# Patient Record
Sex: Male | Born: 1965 | Race: Black or African American | Hispanic: No | Marital: Single | Smoking: Former smoker
Health system: Southern US, Community
[De-identification: ages and names within clinical notes are randomized; demographics above are authoritative.]

## PROBLEM LIST (undated history)

## (undated) DIAGNOSIS — E7800 Pure hypercholesterolemia, unspecified: Secondary | ICD-10-CM

## (undated) DIAGNOSIS — K922 Gastrointestinal hemorrhage, unspecified: Secondary | ICD-10-CM

## (undated) DIAGNOSIS — C189 Malignant neoplasm of colon, unspecified: Secondary | ICD-10-CM

## (undated) DIAGNOSIS — D649 Anemia, unspecified: Secondary | ICD-10-CM

## (undated) DIAGNOSIS — R0600 Dyspnea, unspecified: Secondary | ICD-10-CM

## (undated) DIAGNOSIS — C801 Malignant (primary) neoplasm, unspecified: Secondary | ICD-10-CM

## (undated) DIAGNOSIS — I10 Essential (primary) hypertension: Secondary | ICD-10-CM

## (undated) HISTORY — PX: HEMICOLECTOMY: SHX854

## (undated) HISTORY — PX: COLON RESECTION: SHX5231

## (undated) HISTORY — PX: HERNIA REPAIR: SHX51

---

## 2017-11-27 ENCOUNTER — Encounter (HOSPITAL_COMMUNITY): Payer: Self-pay | Admitting: Cardiology

## 2017-11-27 ENCOUNTER — Emergency Department (HOSPITAL_COMMUNITY)
Admission: EM | Admit: 2017-11-27 | Discharge: 2017-11-27 | Disposition: A | Discharge status: Home / Self Care | Payer: BLUE CROSS/BLUE SHIELD | Attending: Emergency Medicine | Admitting: Emergency Medicine

## 2017-11-27 ENCOUNTER — Other Ambulatory Visit: Payer: Self-pay

## 2017-11-27 ENCOUNTER — Emergency Department (HOSPITAL_COMMUNITY): Payer: BLUE CROSS/BLUE SHIELD

## 2017-11-27 DIAGNOSIS — Z87891 Personal history of nicotine dependence: Secondary | ICD-10-CM | POA: Diagnosis not present

## 2017-11-27 DIAGNOSIS — R072 Precordial pain: Secondary | ICD-10-CM

## 2017-11-27 DIAGNOSIS — R079 Chest pain, unspecified: Secondary | ICD-10-CM | POA: Diagnosis present

## 2017-11-27 DIAGNOSIS — Z79899 Other long term (current) drug therapy: Secondary | ICD-10-CM | POA: Diagnosis not present

## 2017-11-27 DIAGNOSIS — R9431 Abnormal electrocardiogram [ECG] [EKG]: Secondary | ICD-10-CM | POA: Insufficient documentation

## 2017-11-27 HISTORY — DX: Pure hypercholesterolemia, unspecified: E78.00

## 2017-11-27 HISTORY — DX: Malignant (primary) neoplasm, unspecified: C80.1

## 2017-11-27 LAB — CBC WITH DIFFERENTIAL/PLATELET
BASOS ABS: 0 10*3/uL (ref 0.0–0.1)
BASOS PCT: 0 %
Eosinophils Absolute: 0.1 10*3/uL (ref 0.0–0.7)
Eosinophils Relative: 1 %
HEMATOCRIT: 43.8 % (ref 39.0–52.0)
Hemoglobin: 14.6 g/dL (ref 13.0–17.0)
Lymphocytes Relative: 29 %
Lymphs Abs: 2.4 10*3/uL (ref 0.7–4.0)
MCH: 30.8 pg (ref 26.0–34.0)
MCHC: 33.3 g/dL (ref 30.0–36.0)
MCV: 92.4 fL (ref 78.0–100.0)
MONO ABS: 0.5 10*3/uL (ref 0.1–1.0)
Monocytes Relative: 6 %
NEUTROS ABS: 5.1 10*3/uL (ref 1.7–7.7)
Neutrophils Relative %: 64 %
PLATELETS: 244 10*3/uL (ref 150–400)
RBC: 4.74 MIL/uL (ref 4.22–5.81)
RDW: 13.2 % (ref 11.5–15.5)
WBC: 8 10*3/uL (ref 4.0–10.5)

## 2017-11-27 LAB — COMPREHENSIVE METABOLIC PANEL
ALBUMIN: 3.9 g/dL (ref 3.5–5.0)
ALK PHOS: 68 U/L (ref 38–126)
ALT: 21 U/L (ref 17–63)
ANION GAP: 10 (ref 5–15)
AST: 20 U/L (ref 15–41)
BILIRUBIN TOTAL: 1.2 mg/dL (ref 0.3–1.2)
BUN: 14 mg/dL (ref 6–20)
CALCIUM: 9 mg/dL (ref 8.9–10.3)
CO2: 23 mmol/L (ref 22–32)
Chloride: 106 mmol/L (ref 101–111)
Creatinine, Ser: 1.28 mg/dL — ABNORMAL HIGH (ref 0.61–1.24)
GFR calc Af Amer: >60 mL/min (ref >60)
GLUCOSE: 100 mg/dL — AB (ref 65–99)
POTASSIUM: 3.8 mmol/L (ref 3.5–5.1)
Sodium: 139 mmol/L (ref 135–145)
TOTAL PROTEIN: 7.3 g/dL (ref 6.5–8.1)

## 2017-11-27 LAB — I-STAT TROPONIN, ED
Troponin i, poc: 0.02 ng/mL (ref 0.00–0.08)
Troponin i, poc: 0.01 ng/mL (ref 0.00–0.08)

## 2017-11-27 MED ORDER — IBUPROFEN 800 MG PO TABS: 800.0000 mg | ORAL_TABLET | Freq: Three times a day (TID) | ORAL | 0 refills | Status: DC | PRN

## 2017-11-27 NOTE — Progress Notes (Signed)
Discussed patient with Dr Laverta Baltimore ER staff. Presents with chronic chest pain since Thursday, overall atypical and positional. Enzymes negative. EKG with LVH and probable strain pattern. Plan for outpatient cardiac evaluation. We will work to arrange appt.    Carlyle Dolly MD

## 2017-11-27 NOTE — Discharge Instructions (Signed)

## 2017-11-27 NOTE — ED Provider Notes (Signed)
Emergency Department Provider Note   I have reviewed the triage vital signs and the nursing notes.   HISTORY  Chief Complaint Chest Pain   HPI [name] is a 52 y.o. male with PMH of HLD admitted for evaluation of chest pain for the past 4 days.  The patient went to his primary care physician's office today who performed an EKG which was abnormal and he was referred to the emergency department.  Patient describes his discomfort as in the center of his chest and worse with moving certain directions.  States is especially bad when he is laying on his stomach.  He denies any exertional or pleuritic pain.  He has not been without any chest pain since onset.  He has not taken any over-the-counter medications for pain.  No history of heart attack.  He reports a remote history of stress test and does not follow with a cardiologist regularly.    Past Medical History:  Diagnosis Date  . Cancer [facility])    colon cancer  . High cholesterol     There are no active problems to display for this patient.   Past Surgical History:  Procedure Laterality Date  . COLON RESECTION    . HERNIA REPAIR      Current Outpatient Rx  . Order #: [id number] Class: Historical Med  . Order #: [id number] Class: Historical Med  . Order #: [id number] Class: Print    Allergies Patient has no known allergies.  Family History  Problem Relation Age of Onset  . Hypertension Mother   . Hypertension Sister   . Hypertension Brother   . Heart attack Cousin     Social History Social History   Tobacco Use  . Smoking status: Former Research scientist (life sciences)  . Smokeless tobacco: Never Used  Substance Use Topics  . Alcohol use: No    Frequency: Never  . Drug use: No    Review of Systems  Constitutional: No fever/chills Eyes: No visual changes. ENT: No sore throat. Cardiovascular: Positive chest pain. Respiratory: Denies shortness of breath. Gastrointestinal: No abdominal pain.  No nausea, no vomiting.  No diarrhea.   No constipation. Genitourinary: Negative for dysuria. Musculoskeletal: Negative for back pain. Skin: Negative for rash. Neurological: Negative for headaches, focal weakness or numbness.  10-point ROS otherwise negative.  ____________________________________________   PHYSICAL EXAM:  VITAL SIGNS: ED Triage Vitals  Enc Vitals Group     BP 11/27/17 1119 (!) 108/102     Pulse Rate 11/27/17 1119 (!) 59     Resp 11/27/17 1119 17     Temp 11/27/17 1119 (!) 97.5 F (36.4 C)     Temp Source 11/27/17 1119 Oral     SpO2 11/27/17 1119 100 %     Weight 11/27/17 1121 220 lb (99.8 kg)     Height 11/27/17 1121 6' (1.829 m)     Pain Score 11/27/17 1120 4   Constitutional: Alert and oriented. Well appearing and in no acute distress. Eyes: Conjunctivae are normal. Head: Atraumatic. Nose: No congestion/rhinnorhea. Mouth/Throat: Mucous membranes are moist.  Neck: No stridor. Cardiovascular: Normal rate, regular rhythm. Good peripheral circulation. Grossly normal heart sounds.   Respiratory: Normal respiratory effort.  No retractions. Lungs CTAB. Gastrointestinal: Soft and nontender. No distention.  Musculoskeletal: No lower extremity tenderness nor edema. No gross deformities of extremities. Neurologic:  Normal speech and language. No gross focal neurologic deficits are appreciated.  Skin:  Skin is warm, dry and intact. No rash noted.  ____________________________________________   LABS (all labs  ordered are listed, but only abnormal results are displayed)  Labs Reviewed  COMPREHENSIVE METABOLIC PANEL - Abnormal; Notable for the following components:      Result Value   Glucose, Bld 100 (*)    Creatinine, Ser 1.28 (*)    All other components within normal limits  CBC WITH DIFFERENTIAL/PLATELET  I-STAT TROPONIN, ED  I-STAT TROPONIN, ED   ____________________________________________  EKG   EKG Interpretation  Date/Time:  Monday November 27 2017 11:56:16 EST Ventricular Rate:    61 PR Interval:    QRS Duration: 101 QT Interval:  412 QTC Calculation: 415 R Axis:   56 Text Interpretation:  Sinus rhythm Atrial premature complex Consider left atrial enlargement LVH with secondary repolarization abnormality Baseline wander in lead(s) V6 No STEMI.  Confirmed by Nanda Quinton [phone]) on 11/27/2017 12:25:53 PM       ____________________________________________  RADIOLOGY  Dg Chest 2 View  Result Date: 11/27/2017 CLINICAL DATA:  Left-sided chest pain.  Former smoker. EXAM: CHEST  2 VIEW COMPARISON:  None. FINDINGS: The heart size and mediastinal contours are within normal limits. Both lungs are clear. Small right pleural effusion noted. The visualized skeletal structures are unremarkable. IMPRESSION: 1. Small right pleural effusion.  Exam otherwise unremarkable. Electronically Signed   By: Kerby Moors M.D.   On: 11/27/2017 12:25    ____________________________________________   PROCEDURES  Procedure(s) performed:   Procedures  None ____________________________________________   INITIAL IMPRESSION / ASSESSMENT AND PLAN / ED COURSE  Pertinent labs & imaging results that were available during my care of the patient were reviewed by me and considered in my medical decision making (see chart for details).  Patient presents to the emergency department with 4 days of constant chest discomfort made worse with movement.  EKG is concerning but I do not see evidence of STEMI.  Plan for labs, chest x-ray, discussion with cardiology as needed.  Differential includes all life-threatening causes for chest pain. This includes but is not exclusive to acute coronary syndrome, aortic dissection, pulmonary embolism, cardiac tamponade, community-acquired pneumonia, pericarditis, musculoskeletal chest wall pain, etc.  01:30 PM Spoke with Cardiology Dr. Harl Bowie. He has reviewed the EKG and believes that the changes are most consistent with LVH. With atypical pain character he  will arrange outpatient evaluation. I do plan to trend biomarkers here. Discussed with patient who is in agreement with plan.   Repeat troponin negative. Plan for outpatient Cardiology follow up and PCP evaluation. Discharged home with Motrin and discussed return precautions in detail.   At this time, I do not feel there is any life-threatening condition present. I have reviewed and discussed all results (EKG, imaging, lab, urine as appropriate), exam findings with patient. I have reviewed nursing notes and appropriate previous records.  I feel the patient is safe to be discharged home without further emergent workup. Discussed usual and customary return precautions. Patient and family (if present) verbalize understanding and are comfortable with this plan.  Patient will follow-up with their primary care provider. If they do not have a primary care provider, information for follow-up has been provided to them. All questions have been answered.  ____________________________________________  FINAL CLINICAL IMPRESSION(S) / ED DIAGNOSES  Final diagnoses:  Precordial chest pain    NEW OUTPATIENT MEDICATIONS STARTED DURING THIS VISIT:  Discharge Medication List as of 11/27/2017  3:00 PM    START taking these medications   Details  ibuprofen (ADVIL,MOTRIN) 800 MG tablet Take 1 tablet (800 mg total) by mouth every 8 (eight)  hours as needed., Starting Mon 11/27/2017, Print        Note:  This document was prepared using Dragon voice recognition software and may include unintentional dictation errors.  Nanda Quinton, MD Emergency Medicine    Long, Wonda Olds, MD 11/27/17 [phone]

## 2017-11-27 NOTE — ED Notes (Signed)
Patient transported to X-ray 

## 2017-11-27 NOTE — ED Triage Notes (Signed)
Chest pain since Thursday.  Went to PCP today and sent here to ER.  Pt had abnormal EKG in office.

## 2017-12-21 ENCOUNTER — Encounter: Payer: Self-pay | Admitting: Cardiology

## 2017-12-21 ENCOUNTER — Ambulatory Visit (INDEPENDENT_AMBULATORY_CARE_PROVIDER_SITE_OTHER): Payer: BLUE CROSS/BLUE SHIELD | Admitting: Cardiology

## 2017-12-21 VITALS — BP 161/98 | HR 60 | Ht 72.0 in | Wt 216.0 lb

## 2017-12-21 DIAGNOSIS — R030 Elevated blood-pressure reading, without diagnosis of hypertension: Secondary | ICD-10-CM

## 2017-12-21 DIAGNOSIS — R9431 Abnormal electrocardiogram [ECG] [EKG]: Secondary | ICD-10-CM

## 2017-12-21 DIAGNOSIS — R079 Chest pain, unspecified: Secondary | ICD-10-CM | POA: Diagnosis not present

## 2017-12-21 NOTE — Patient Instructions (Signed)
Your physician recommends that you schedule a follow-up appointment in: 3 MONTHS with Dr Harl Bowie    Your physician recommends that you continue on your current medications as directed. Please refer to the Current Medication list given to you today.    Your physician has requested that you have an echocardiogram. Echocardiography is a painless test that uses sound waves to create images of your heart. It provides your doctor with information about the size and shape of your heart and how well your heart's chambers and valves are working. This procedure takes approximately one hour. There are no restrictions for this procedure.  Your physician has requested that you have en exercise stress myoview. For further information please visit [email or web address]. Please follow instruction sheet, as given.   DASH Eating Plan DASH stands for "Dietary Approaches to Stop Hypertension." The DASH eating plan is a healthy eating plan that has been shown to reduce high blood pressure (hypertension). It may also reduce your risk for type 2 diabetes, heart disease, and stroke. The DASH eating plan may also help with weight loss. What are tips for following this plan? General guidelines  Avoid eating more than 2,300 mg (milligrams) of salt (sodium) a day. If you have hypertension, you may need to reduce your sodium intake to 1,500 mg a day.  Limit alcohol intake to no more than 1 drink a day for nonpregnant women and 2 drinks a day for men. One drink equals 12 oz of beer, 5 oz of wine, or 1 oz of hard liquor.  Work with your health care provider to maintain a healthy body weight or to lose weight. Ask what an ideal weight is for you.  Get at least 30 minutes of exercise that causes your heart to beat faster (aerobic exercise) most days of the week. Activities may include walking, swimming, or biking.  Work with your health care provider or diet and nutrition specialist (dietitian) to adjust your eating plan to  your individual calorie needs. Reading food labels  Check food labels for the amount of sodium per serving. Choose foods with less than 5 percent of the Daily Value of sodium. Generally, foods with less than 300 mg of sodium per serving fit into this eating plan.  To find whole grains, look for the word "whole" as the first word in the ingredient list. Shopping  Buy products labeled as "low-sodium" or "no salt added."  Buy fresh foods. Avoid canned foods and premade or frozen meals. Cooking  Avoid adding salt when cooking. Use salt-free seasonings or herbs instead of table salt or sea salt. Check with your health care provider or pharmacist before using salt substitutes.  Do not fry foods. Cook foods using healthy methods such as baking, boiling, grilling, and broiling instead.  Cook with heart-healthy oils, such as olive, canola, soybean, or sunflower oil. Meal planning   Eat a balanced diet that includes: ? 5 or more servings of fruits and vegetables each day. At each meal, try to fill half of your plate with fruits and vegetables. ? Up to 6-8 servings of whole grains each day. ? Less than 6 oz of lean meat, poultry, or fish each day. A 3-oz serving of meat is about the same size as a deck of cards. One egg equals 1 oz. ? 2 servings of low-fat dairy each day. ? A serving of nuts, seeds, or beans 5 times each week. ? Heart-healthy fats. Healthy fats called Omega-3 fatty acids are found in foods such as  flaxseeds and coldwater fish, like sardines, salmon, and mackerel.  Limit how much you eat of the following: ? Canned or prepackaged foods. ? Food that is high in trans fat, such as fried foods. ? Food that is high in saturated fat, such as fatty meat. ? Sweets, desserts, sugary drinks, and other foods with added sugar. ? Full-fat dairy products.  Do not salt foods before eating.  Try to eat at least 2 vegetarian meals each week.  Eat more home-cooked food and less restaurant,  buffet, and fast food.  When eating at a restaurant, ask that your food be prepared with less salt or no salt, if possible. What foods are recommended? The items listed may not be a complete list. Talk with your dietitian about what dietary choices are best for you. Grains Whole-grain or whole-wheat bread. Whole-grain or whole-wheat pasta. Brown rice. [place] Morrow. Bulgur. Whole-grain and low-sodium cereals. Pita bread. Low-fat, low-sodium crackers. Whole-wheat flour tortillas. Vegetables Fresh or frozen vegetables (raw, steamed, roasted, or grilled). Low-sodium or reduced-sodium tomato and vegetable juice. Low-sodium or reduced-sodium tomato sauce and tomato paste. Low-sodium or reduced-sodium canned vegetables. Fruits All fresh, dried, or frozen fruit. Canned fruit in natural juice (without added sugar). Meat and other protein foods Skinless chicken or [place]. Ground chicken or [place]. Pork with fat trimmed off. Fish and seafood. Egg whites. Dried beans, peas, or lentils. Unsalted nuts, nut butters, and seeds. Unsalted canned beans. Lean cuts of beef with fat trimmed off. Low-sodium, lean deli meat. Dairy Low-fat (1%) or fat-free (skim) milk. Fat-free, low-fat, or reduced-fat cheeses. Nonfat, low-sodium ricotta or cottage cheese. Low-fat or nonfat yogurt. Low-fat, low-sodium cheese. Fats and oils Soft margarine without trans fats. Vegetable oil. Low-fat, reduced-fat, or light mayonnaise and salad dressings (reduced-sodium). Canola, safflower, olive, soybean, and sunflower oils. Avocado. Seasoning and other foods Herbs. Spices. Seasoning mixes without salt. Unsalted popcorn and pretzels. Fat-free sweets. What foods are not recommended? The items listed may not be a complete list. Talk with your dietitian about what dietary choices are best for you. Grains Baked goods made with fat, such as croissants, muffins, or some breads. Dry pasta or rice meal packs. Vegetables Creamed or fried  vegetables. Vegetables in a cheese sauce. Regular canned vegetables (not low-sodium or reduced-sodium). Regular canned tomato sauce and paste (not low-sodium or reduced-sodium). Regular tomato and vegetable juice (not low-sodium or reduced-sodium). Angie Fava. Olives. Fruits Canned fruit in a light or heavy syrup. Fried fruit. Fruit in cream or butter sauce. Meat and other protein foods Fatty cuts of meat. Ribs. Fried meat. Berniece Salines. Sausage. Bologna and other processed lunch meats. Salami. Fatback. Hotdogs. Bratwurst. Salted nuts and seeds. Canned beans with added salt. Canned or smoked fish. Whole eggs or egg yolks. Chicken or [place] with skin. Dairy Whole or 2% milk, cream, and half-and-half. Whole or full-fat cream cheese. Whole-fat or sweetened yogurt. Full-fat cheese. Nondairy creamers. Whipped toppings. Processed cheese and cheese spreads. Fats and oils Butter. Stick margarine. Lard. Shortening. Ghee. Bacon fat. Tropical oils, such as coconut, palm kernel, or palm oil. Seasoning and other foods Salted popcorn and pretzels. Onion salt, garlic salt, seasoned salt, table salt, and sea salt. Worcestershire sauce. Tartar sauce. Barbecue sauce. Teriyaki sauce. Soy sauce, including reduced-sodium. Steak sauce. Canned and packaged gravies. Fish sauce. Oyster sauce. Cocktail sauce. Horseradish that you find on the shelf. Ketchup. Mustard. Meat flavorings and tenderizers. Bouillon cubes. Hot sauce and Tabasco sauce. Premade or packaged marinades. Premade or packaged taco seasonings. Relishes. Regular salad dressings. Where  to find more information:  National Heart, Lung, and [place]: https://wilson-eaton.com/  American Heart Association: www.heart.org Summary  The DASH eating plan is a healthy eating plan that has been shown to reduce high blood pressure (hypertension). It may also reduce your risk for type 2 diabetes, heart disease, and stroke.  With the DASH eating plan, you should limit salt (sodium)  intake to 2,300 mg a day. If you have hypertension, you may need to reduce your sodium intake to 1,500 mg a day.  When on the DASH eating plan, aim to eat more fresh fruits and vegetables, whole grains, lean proteins, low-fat dairy, and heart-healthy fats.  Work with your health care provider or diet and nutrition specialist (dietitian) to adjust your eating plan to your individual calorie needs. This information is not intended to replace advice given to you by your health care provider. Make sure you discuss any questions you have with your health care provider. Document Released: 10/06/2011 Document Revised: 10/10/2016 Document Reviewed: 10/10/2016 Elsevier Interactive Patient Education  [employer].

## 2017-12-21 NOTE — Progress Notes (Signed)
Clinical Summary [name] is a 52 y.o.male seen as new consult, referred by Dr Woody Seller for chest pain.   1. Chest pain - seen in ER Nov 27 2017 with atypical chest pain.  - EKG with LVH, probable strain pattern. Trop neg x 2. CXR small right effusion - left sided, pressure like pain. Radiated to shoulder blade, needles like feeling. No other associated symptoms. Worst with position. Constant for several days in a row.  - better with ibuprofen. Symptoms have since resolved  CAD risk factors: HL, former smoker   2. Hyperlipidemia - was on lipitor several years ago he reports  3. Elevated BP - taking ibuprofen at home for ongoing back pain - prior bps at goal with other visits  Past Medical History:  Diagnosis Date  . Cancer [facility])    colon cancer  . High cholesterol      No Known Allergies   Current Outpatient Medications  Medication Sig Dispense Refill  . cyclobenzaprine (FLEXERIL) 10 MG tablet TAKE 1 TABLET AS NEEDED AT BEDTIME FOR MUSCLE SPASM  1  . ibuprofen (ADVIL,MOTRIN) 800 MG tablet Take 1 tablet (800 mg total) by mouth every 8 (eight) hours as needed. 21 tablet 0  . traMADol (ULTRAM) 50 MG tablet Take 50 mg by mouth every 12 (twelve) hours as needed for moderate pain.     No current facility-administered medications for this visit.      Past Surgical History:  Procedure Laterality Date  . COLON RESECTION    . HERNIA REPAIR       No Known Allergies    Family History  Problem Relation Age of Onset  . Hypertension Mother   . Hypertension Sister   . Hypertension Brother   . Heart attack Cousin      Social History [name] reports that he has quit smoking. he has never used smokeless tobacco. [name] reports that he does not drink alcohol.   Review of Systems CONSTITUTIONAL: No weight loss, fever, chills, weakness or fatigue.  HEENT: Eyes: No visual loss, blurred vision, double vision or yellow sclerae.No hearing loss, sneezing,  congestion, runny nose or sore throat.  SKIN: No rash or itching.  CARDIOVASCULAR: per hpi RESPIRATORY: per hpi GASTROINTESTINAL: No anorexia, nausea, vomiting or diarrhea. No abdominal pain or blood.  GENITOURINARY: No burning on urination, no polyuria NEUROLOGICAL: No headache, dizziness, syncope, paralysis, ataxia, numbness or tingling in the extremities. No change in bowel or bladder control.  MUSCULOSKELETAL: No muscle, back pain, joint pain or stiffness.  LYMPHATICS: No enlarged nodes. No history of splenectomy.  PSYCHIATRIC: No history of depression or anxiety.  ENDOCRINOLOGIC: No reports of sweating, cold or heat intolerance. No polyuria or polydipsia.  Marland Kitchen   Physical Examination Vitals:   12/21/17 0915 12/21/17 0920  BP: (!) 157/102 (!) 161/98  Pulse: 60   SpO2: 98%    Vitals:   12/21/17 0915  Weight: 216 lb (98 kg)  Height: 6' (1.829 m)    Gen: resting comfortably, no acute distress HEENT: no scleral icterus, pupils equal round and reactive, no palptable cervical adenopathy,  CV: RRR, no m/r/,g no jvd Resp: Clear to auscultation bilaterally GI: abdomen is soft, non-tender, non-distended, normal bowel sounds, no hepatosplenomegaly MSK: extremities are warm, no edema.  Skin: warm, no rash Neuro:  no focal deficits Psych: appropriate affect   Diagnostic Studies     Assessment and Plan  1. Chest pain - atypical chest pain. Abnormal EKG, likely LVH with  strain but cannot excluded lateral ischemia - given symptoms and abnromal EKG along with CAD risk factors, further testing is warranted. - will start with echo, pending results likely obtain exercise nuclear stress test.  2. Elevated bp - bp elevated in clinic, from chart review bp's have been normal at other clinic visits - follow bp trend, given info on DASH diet. If significant LVH on echo consider starting medical therapy.   F/u 3 months     Arnoldo Lenis, M.D

## 2017-12-26 ENCOUNTER — Encounter: Payer: Self-pay | Admitting: Cardiology

## 2017-12-29 ENCOUNTER — Ambulatory Visit (HOSPITAL_COMMUNITY): Payer: BLUE CROSS/BLUE SHIELD

## 2017-12-29 ENCOUNTER — Other Ambulatory Visit (HOSPITAL_COMMUNITY): Payer: BLUE CROSS/BLUE SHIELD

## 2018-04-12 ENCOUNTER — Ambulatory Visit: Payer: BLUE CROSS/BLUE SHIELD | Admitting: Cardiology

## 2018-04-12 NOTE — Progress Notes (Deleted)
Clinical Summary Mr. Matheny is a 52 y.o.male    1. Chest pain - seen in ER Nov 27 2017 with atypical chest pain.  - EKG with LVH, probable strain pattern. Trop neg x 2. CXR small right effusion - left sided, pressure like pain. Radiated to shoulder blade, needles like feeling. No other associated symptoms. Worst with position. Constant for several days in a row.  - better with ibuprofen. Symptoms have since resolved  CAD risk factors: HL, former smoker   2. Hyperlipidemia - was on lipitor several years ago he reports  3. Elevated BP - taking ibuprofen at home for ongoing back pain - prior bps at goal with other visits     Past Medical History:  Diagnosis Date  . Cancer [facility])    colon cancer  . High cholesterol      No Known Allergies   Current Outpatient Medications  Medication Sig Dispense Refill  . cyclobenzaprine (FLEXERIL) 10 MG tablet TAKE 1 TABLET AS NEEDED AT BEDTIME FOR MUSCLE SPASM  1  . ibuprofen (ADVIL,MOTRIN) 800 MG tablet Take 1 tablet (800 mg total) by mouth every 8 (eight) hours as needed. 21 tablet 0  . traMADol (ULTRAM) 50 MG tablet Take 50 mg by mouth every 12 (twelve) hours as needed for moderate pain.     No current facility-administered medications for this visit.      Past Surgical History:  Procedure Laterality Date  . COLON RESECTION    . HERNIA REPAIR       No Known Allergies    Family History  Problem Relation Age of Onset  . Hypertension Mother   . Hypertension Sister   . Hypertension Brother   . Heart attack Cousin   . Cancer Father      Social History Mr. Hutzler reports that he has quit smoking. He has never used smokeless tobacco. Mr. Helle reports that he does not drink alcohol.   Review of Systems CONSTITUTIONAL: No weight loss, fever, chills, weakness or fatigue.  HEENT: Eyes: No visual loss, blurred vision, double vision or yellow sclerae.No hearing loss, sneezing, congestion, runny nose or  sore throat.  SKIN: No rash or itching.  CARDIOVASCULAR:  RESPIRATORY: No shortness of breath, cough or sputum.  GASTROINTESTINAL: No anorexia, nausea, vomiting or diarrhea. No abdominal pain or blood.  GENITOURINARY: No burning on urination, no polyuria NEUROLOGICAL: No headache, dizziness, syncope, paralysis, ataxia, numbness or tingling in the extremities. No change in bowel or bladder control.  MUSCULOSKELETAL: No muscle, back pain, joint pain or stiffness.  LYMPHATICS: No enlarged nodes. No history of splenectomy.  PSYCHIATRIC: No history of depression or anxiety.  ENDOCRINOLOGIC: No reports of sweating, cold or heat intolerance. No polyuria or polydipsia.  Marland Kitchen   Physical Examination There were no vitals filed for this visit. There were no vitals filed for this visit.  Gen: resting comfortably, no acute distress HEENT: no scleral icterus, pupils equal round and reactive, no palptable cervical adenopathy,  CV Resp: Clear to auscultation bilaterally GI: abdomen is soft, non-tender, non-distended, normal bowel sounds, no hepatosplenomegaly MSK: extremities are warm, no edema.  Skin: warm, no rash Neuro:  no focal deficits Psych: appropriate affect   Diagnostic Studies     Assessment and Plan   1. Chest pain - atypical chest pain. Abnormal EKG, likely LVH with strain but cannot excluded lateral ischemia - given symptoms and abnromal EKG along with CAD risk factors, further testing is warranted. - will start with echo,  pending results likely obtain exercise nuclear stress test.  2. Elevated bp - bp elevated in clinic, from chart review bp's have been normal at other clinic visits - follow bp trend, given info on DASH diet. If significant LVH on echo consider starting medical therapy.   F/u 3 months       Arnoldo Lenis, M.D., F.A.C.C.

## 2019-09-24 ENCOUNTER — Emergency Department (HOSPITAL_COMMUNITY)
Admission: EM | Admit: 2019-09-24 | Discharge: 2019-09-24 | Disposition: A | Discharge status: Home / Self Care | Payer: BC Managed Care – PPO | Attending: Emergency Medicine | Admitting: Emergency Medicine

## 2019-09-24 ENCOUNTER — Other Ambulatory Visit: Payer: Self-pay

## 2019-09-24 ENCOUNTER — Encounter: Payer: Self-pay | Admitting: Physical Medicine and Rehabilitation

## 2019-09-24 ENCOUNTER — Emergency Department (HOSPITAL_COMMUNITY): Payer: BC Managed Care – PPO

## 2019-09-24 ENCOUNTER — Encounter (HOSPITAL_COMMUNITY): Payer: Self-pay

## 2019-09-24 ENCOUNTER — Encounter
Payer: Worker's Compensation | Attending: Physical Medicine and Rehabilitation | Admitting: Physical Medicine and Rehabilitation

## 2019-09-24 VITALS — BP 147/89 | HR 73 | Temp 98.4°F | Ht 72.0 in | Wt 224.0 lb

## 2019-09-24 DIAGNOSIS — R531 Weakness: Secondary | ICD-10-CM | POA: Diagnosis present

## 2019-09-24 DIAGNOSIS — Z85038 Personal history of other malignant neoplasm of large intestine: Secondary | ICD-10-CM | POA: Diagnosis not present

## 2019-09-24 DIAGNOSIS — Z87891 Personal history of nicotine dependence: Secondary | ICD-10-CM | POA: Diagnosis not present

## 2019-09-24 DIAGNOSIS — M47812 Spondylosis without myelopathy or radiculopathy, cervical region: Secondary | ICD-10-CM

## 2019-09-24 DIAGNOSIS — M5030 Other cervical disc degeneration, unspecified cervical region: Secondary | ICD-10-CM | POA: Diagnosis not present

## 2019-09-24 DIAGNOSIS — M62838 Other muscle spasm: Secondary | ICD-10-CM | POA: Insufficient documentation

## 2019-09-24 DIAGNOSIS — R202 Paresthesia of skin: Secondary | ICD-10-CM | POA: Insufficient documentation

## 2019-09-24 LAB — CBC
HCT: 39.7 % (ref 39.0–52.0)
Hemoglobin: 13.1 g/dL (ref 13.0–17.0)
MCH: 30 pg (ref 26.0–34.0)
MCHC: 33 g/dL (ref 30.0–36.0)
MCV: 91.1 fL (ref 80.0–100.0)
Platelets: 270 10*3/uL (ref 150–400)
RBC: 4.36 MIL/uL (ref 4.22–5.81)
RDW: 13 % (ref 11.5–15.5)
WBC: 7.7 10*3/uL (ref 4.0–10.5)
nRBC: 0 % (ref 0.0–0.2)

## 2019-09-24 LAB — BASIC METABOLIC PANEL
Anion gap: 8 (ref 5–15)
BUN: 10 mg/dL (ref 6–20)
CO2: 27 mmol/L (ref 22–32)
Calcium: 9.2 mg/dL (ref 8.9–10.3)
Chloride: 106 mmol/L (ref 98–111)
Creatinine, Ser: 1.37 mg/dL — ABNORMAL HIGH (ref 0.61–1.24)
GFR calc Af Amer: >60 mL/min (ref >60)
GFR calc non Af Amer: 58 mL/min — ABNORMAL LOW (ref >60)
Glucose, Bld: 95 mg/dL (ref 70–99)
Potassium: 4.3 mmol/L (ref 3.5–5.1)
Sodium: 141 mmol/L (ref 135–145)

## 2019-09-24 MED ORDER — GADOBUTROL 1 MMOL/ML IV SOLN
10.0000 mL | Freq: Once | INTRAVENOUS | Status: AC | PRN
Start: 2019-09-24 — End: 2019-09-24
  Administered 2019-09-24: 18:00:00 10 mL via INTRAVENOUS

## 2019-09-24 NOTE — ED Notes (Signed)
Pt in MRI.

## 2019-09-24 NOTE — Discharge Instructions (Signed)
You were seen in the emergency department today with intermittent weakness in your leg along with tingling and spasms.  Your MRI showed some minor degenerative disc disease but nothing to explain your symptoms.  Please call the neurologist listed who will continue to be work-up as an outpatient.  I have placed a referral but you will need to call to confirm the appointment.  Return to the emergency department any new or suddenly worsening symptoms

## 2019-09-24 NOTE — ED Triage Notes (Signed)
Pt sent here by PCP for an MRI to rule out cervical myelopathy due to left leg weakness and right wrist weakness since a work injury on 11/4 when he hit his finger on an open light and felt pain shooting through his body.

## 2019-09-24 NOTE — Progress Notes (Addendum)
Subjective:    Patient ID: [name], male    DOB: 19-Aug-1966, 53 y.o.   MRN: [id number]  HPI  [name] had a work injury on 11/4 where he hit his finger on an open light and felt a pain shooting through his body including his neck. Since then he has felt a shooting pain from his neck down his arms and in his legs with progressive weakness. He denies urinary incontinence or bowel incontinence.   Pain Inventory Average Pain 3 Pain Right Now 1 My pain is intermittent, dull, tingling, aching and headaches sometimes  In the last 24 hours, has pain interfered with the following? General activity 3 Relation with others 1 Enjoyment of life 2 What TIME of day is your pain at its worst? morning and night Sleep (in general) Fair  Pain is worse with: walking and standing Pain improves with: rest Relief from Meds: 2  Mobility walk without assistance ability to climb steps?  yes do you drive?  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  no  Relates that about 12 hours after shock he was laying in bed and right leg started jumping and then left leg went numb primarily in anterior thigh and down leg  Physicians involved in your care Any changes since last visit?  no Primary care Dhruv Vyas MDV   Family History  Problem Relation Age of Onset  . Hypertension Mother   . Hypertension Sister   . Hypertension Brother   . Heart attack Cousin   . Cancer Father    Social History   Socioeconomic History  . Marital status: Single    Spouse name: Not on file  . Number of children: Not on file  . Years of education: Not on file  . Highest education level: Not on file  Occupational History  . Not on file  Social Needs  . Financial resource strain: Not on file  . Food insecurity    Worry: Not on file    Inability: Not on file   . Transportation needs    Medical: Not on file    Non-medical: Not on file  Tobacco Use  . Smoking status: Former Research scientist (life sciences)  . Smokeless tobacco: Never Used  Substance and Sexual Activity  . Alcohol use: No    Frequency: Never  . Drug use: No  . Sexual activity: Not on file  Lifestyle  . Physical activity    Days per week: Not on file    Minutes per session: Not on file  . Stress: Not on file  Relationships  . Social Herbalist on phone: Not on file    Gets together: Not on file    Attends religious service: Not on file    Active member of club or organization: Not on file    Attends meetings of clubs or organizations: Not on file    Relationship status: Not on file  Other Topics Concern  . Not on file  Social History Narrative  . Not on file   Past Surgical History:  Procedure Laterality Date  . COLON RESECTION    . HERNIA REPAIR     Past Medical History:  Diagnosis Date  . Cancer [facility])    colon cancer  . High cholesterol    BP (!) 147/89   Pulse 73   Temp 98.4  F (36.9 C)   Ht 6' (1.829 m)   Wt 224 lb (101.6 kg)   SpO2 97%   BMI 30.38 kg/m   Opioid Risk Score:   Fall Risk Score:  `1  Depression screen PHQ 2/9  Depression screen PHQ 2/9 09/24/2019  Decreased Interest 2  Down, Depressed, Hopeless 2  PHQ - 2 Score 4  Altered sleeping 3  Tired, decreased energy 2  Change in appetite 1  Feeling bad or failure about yourself  0  Trouble concentrating 1  Moving slowly or fidgety/restless 2  Suicidal thoughts 0  PHQ-9 Score 13  Difficult doing work/chores Somewhat difficult    Review of Systems  Constitutional: Negative.   HENT: Negative.   Respiratory: Negative.   Cardiovascular: Negative.   Gastrointestinal: Negative.   Endocrine: Negative.   Genitourinary: Negative.   Musculoskeletal:       Spasms  Skin: Negative.   Allergic/Immunologic: Negative.   Neurological: Positive for dizziness and numbness.       Tingling   Hematological: Negative.   Psychiatric/Behavioral: Positive for dysphoric mood.  All other systems reviewed and are negative.      Objective:   Physical Exam  Gen: no distress, normal appearing HEENT: oral mucosa pink and moist, NCAT Cardio: Reg rate Chest: normal effort, normal rate of breathing Abd: soft, non-distended Ext: no edema Skin: intact Neuro: AOx3.  Musculoskeletal: 4/5 weakness in his left leg and 4/5 weakness in right wrist flexion.  Psych: pleasant, normal affect    Assessment & Plan:  [name] had a work injury on 11/4 where he hit his finger on an open light and felt a pain shooting through his body including his neck. Since then he has felt a shooting pain from his neck down his arms and in his legs with progressive weakness. He denies urinary incontinence or bowel incontinence.   I recommend urgent MRI to r/o cervical myelopathy given his severe weakness in his left leg and right wrist. I advised that patient should go to the ED. He should not work and lift heavy loads until he has his MRI. We will determine patient treatment plan based on this.   Case discussed with patient and Worker's Curator. I will also call ED to discuss.   I will also provide letter regarding return to work. He should not work until cleared after the MRI.   Leeroy Cha, MD

## 2019-09-24 NOTE — ED Provider Notes (Signed)
Blood pressure 138/83, pulse 62, temperature 98.5 F (36.9 C), temperature source Oral, resp. rate 16, height 6' (1.829 m), weight 101.2 kg, SpO2 98 %.  Assuming care from Dr. Rex Kras.  In short, [name] is a 53 y.o. male with a chief complaint of Weakness .  Refer to the original H&P for additional details.  The current plan of care is to f/u on MRI cervical spine and reassess.  07:07 PM  MRI motion degraded but no acute findings. Some disc degeneration noted but nothing obvious for transverse myelitis.  Placed an ambulatory referral to outpatient neurology for further outpatient management.  Discussed plan with patient.  He will call neurology office tomorrow to schedule the appointment.  Discussed ED return precautions.     Margette Fast, MD 09/24/19 [phone]

## 2019-09-24 NOTE — ED Provider Notes (Signed)
[place] EMERGENCY DEPARTMENT Provider Note   CSN: [id number] Arrival date & time: 09/24/19  1225     History   Chief Complaint Chief Complaint  Patient presents with  . Weakness    HPI [name] is a 53 y.o. male.     53 year old male with past medical history below including colon cancer, hyperlipidemia who presents with weakness and numbness.  On 11/4, the patient sustained an electrical shock to his right hand when he touched an open light at work.  He was seen immediately in the [facility] ER and then 12 hours later he returned because of "jumping" leg muscle spasms in right leg and numbness in left leg.  He was again evaluated and discharged home.  He reports that he has continued to have intermittent numbness of left leg that comes and goes, not necessarily associated with weakness.  He has been ambulatory.  He also reports occasional pains down his neck that shoot into his arms.  He has noted some occasional arm weakness.  When I asked which arm, the patient could not immediately recall but later pointed to his right arm.  He was evaluated by PM&R physician today who referred to ED for imaging due to concern for cervical myelopathy. He reports mild low back pain that was present prior to injury on 11/4. No bowel/bladder problems, fevers, vision changes, or recent illness. He has occasional headaches.  The history is provided by the patient.  Weakness   Past Medical History:  Diagnosis Date  . Cancer [facility])    colon cancer  . High cholesterol     There are no active problems to display for this patient.   Past Surgical History:  Procedure Laterality Date  . COLON RESECTION    . HERNIA REPAIR          Home Medications    Prior to Admission medications   Medication Sig Start Date End Date Taking? Authorizing Provider  loratadine (CLARITIN) 10 MG tablet Take 10 mg by mouth daily as needed for allergies.    [provider]   meclizine (ANTIVERT) 25 MG tablet Take 25 mg by mouth 3 (three) times daily as needed. 09/06/19   [provider]    Family History Family History  Problem Relation Age of Onset  . Hypertension Mother   . Hypertension Sister   . Hypertension Brother   . Heart attack Cousin   . Cancer Father     Social History Social History   Tobacco Use  . Smoking status: Former Research scientist (life sciences)  . Smokeless tobacco: Never Used  Substance Use Topics  . Alcohol use: No    Frequency: Never  . Drug use: No     Allergies   Patient has no known allergies.   Review of Systems Review of Systems  Neurological: Positive for weakness.   All other systems reviewed and are negative except that which was mentioned in HPI   Physical Exam Updated Vital Signs BP 138/83 (BP Location: Left Arm)   Pulse 62   Temp 98.5 F (36.9 C) (Oral)   Resp 16   Ht 6' (1.829 m)   Wt 101.2 kg   SpO2 98%   BMI 30.24 kg/m   Physical Exam Vitals signs and nursing note reviewed.  Constitutional:      General: He is not in acute distress.    Appearance: He is well-developed.     Comments: Awake, alert  HENT:     Head: Normocephalic  and atraumatic.  Eyes:     Extraocular Movements: Extraocular movements intact.     Conjunctiva/sclera: Conjunctivae normal.     Pupils: Pupils are equal, round, and reactive to light.  Neck:     Musculoskeletal: Neck supple.  Cardiovascular:     Rate and Rhythm: Normal rate and regular rhythm.     Heart sounds: Normal heart sounds. No murmur.  Pulmonary:     Effort: Pulmonary effort is normal. No respiratory distress.     Breath sounds: Normal breath sounds.  Abdominal:     General: Bowel sounds are normal. There is no distension.     Palpations: Abdomen is soft.     Tenderness: There is no abdominal tenderness.  Skin:    General: Skin is warm and dry.  Neurological:     Mental Status: He is alert and oriented to person, place, and time.     Cranial Nerves: No  cranial nerve deficit.     Motor: No abnormal muscle tone.     Deep Tendon Reflexes: Reflexes are normal and symmetric.     Comments: Fluent speech, normal finger-to-nose testing, negative pronator drift, no clonus 5/5 strength and normal sensation x all 4 extremities  Psychiatric:        Thought Content: Thought content normal.        Judgment: Judgment normal.      ED Treatments / Results  Labs (all labs ordered are listed, but only abnormal results are displayed) Labs Reviewed - No data to display  EKG None  Radiology No results found.  Procedures Procedures (including critical care time)  Medications Ordered in ED Medications - No data to display   Initial Impression / Assessment and Plan / ED Course  I have reviewed the triage vital signs and the nursing notes.  Pertinent labs & imaging results that were available during my care of the patient were reviewed by me and considered in my medical decision making (see chart for details).        Patient appeared to be neurologically intact with no obvious focal/unilateral neurologic deficits.  I reviewed note from PM&R.  Discussed with neurology, Dr. Leonel Ramsay, who recommended MRI cervical spine with and without contrast.  I have ordered this study and signing the patient out pending results. Final Clinical Impressions(s) / ED Diagnoses   Final diagnoses:  None    ED Discharge Orders    None       Elby Blackwelder, Wenda Overland, MD 09/24/19 1559

## 2019-09-25 ENCOUNTER — Telehealth: Payer: Self-pay | Admitting: *Deleted

## 2019-09-25 NOTE — Telephone Encounter (Signed)
[name] called to request a copy of his MRI for personal use. I informed him that he would have to get a copy from [facility]. I called him back to tell him he should activate his MYChart and he would automatically receive results of labs xrays and scans. He will review his AVS and look for the activation code.

## 2019-09-30 ENCOUNTER — Telehealth: Payer: Self-pay | Admitting: *Deleted

## 2019-09-30 NOTE — Telephone Encounter (Signed)
Patient left a message reporting to Dr. Ranell Patrick that MRI was completed. Patient referred to neurologist. Workers comp states neurologist was out of network.  They are finding an alternative. Patient called to let Dr. Ranell Patrick know what is going on. No action required

## 2019-10-02 NOTE — Telephone Encounter (Signed)
Patient is going to contact workers comp Tourist information centre manager to see if he can move up his appointment. He will call us back

## 2019-10-02 NOTE — Telephone Encounter (Signed)
Can appointment with [name] be moved sooner (this week or next) to discuss his MRI results and for his pain management? Thank you!

## 2019-10-03 NOTE — Telephone Encounter (Signed)
[name] called back and was asking about medication for migraine and his neck hurts when he moves a certain way.  I asked about him moving his appt up but he said the case manager said its best to leave it as scheduled (10/10/19).  He says he will wait til then to discuss with Dr Ranell Patrick.

## 2019-10-07 ENCOUNTER — Telehealth: Payer: Self-pay

## 2019-10-07 NOTE — Telephone Encounter (Signed)
Ptn left message at 11:41 no reason but wants return call

## 2019-10-08 NOTE — Telephone Encounter (Signed)
Called patient and he asked about our appointment Thursday-he was confused about the message he received and thought it indicated he was to have a procedure. I discussed that it would be an office visit similar to his last appointment. We discussed his symptoms of migraines and neck pain and will discuss further after examination at his Thursday appointment.

## 2019-10-10 ENCOUNTER — Encounter
Payer: Worker's Compensation | Attending: Physical Medicine and Rehabilitation | Admitting: Physical Medicine and Rehabilitation

## 2019-10-10 ENCOUNTER — Encounter: Payer: Self-pay | Admitting: Physical Medicine and Rehabilitation

## 2019-10-10 ENCOUNTER — Other Ambulatory Visit: Payer: Self-pay

## 2019-10-10 VITALS — BP 158/80 | HR 64 | Temp 97.2°F | Ht 72.0 in | Wt 223.0 lb

## 2019-10-10 DIAGNOSIS — E669 Obesity, unspecified: Secondary | ICD-10-CM | POA: Insufficient documentation

## 2019-10-10 DIAGNOSIS — Z87891 Personal history of nicotine dependence: Secondary | ICD-10-CM | POA: Diagnosis not present

## 2019-10-10 DIAGNOSIS — W868XXD Exposure to other electric current, subsequent encounter: Secondary | ICD-10-CM | POA: Insufficient documentation

## 2019-10-10 DIAGNOSIS — Z6830 Body mass index (BMI) 30.0-30.9, adult: Secondary | ICD-10-CM | POA: Diagnosis not present

## 2019-10-10 DIAGNOSIS — Z8249 Family history of ischemic heart disease and other diseases of the circulatory system: Secondary | ICD-10-CM | POA: Insufficient documentation

## 2019-10-10 DIAGNOSIS — R519 Headache, unspecified: Secondary | ICD-10-CM | POA: Diagnosis not present

## 2019-10-10 DIAGNOSIS — Z85038 Personal history of other malignant neoplasm of large intestine: Secondary | ICD-10-CM | POA: Diagnosis not present

## 2019-10-10 DIAGNOSIS — R42 Dizziness and giddiness: Secondary | ICD-10-CM | POA: Insufficient documentation

## 2019-10-10 DIAGNOSIS — E7800 Pure hypercholesterolemia, unspecified: Secondary | ICD-10-CM | POA: Insufficient documentation

## 2019-10-10 DIAGNOSIS — R202 Paresthesia of skin: Secondary | ICD-10-CM | POA: Diagnosis not present

## 2019-10-10 DIAGNOSIS — Y33XXXD Other specified events, undetermined intent, subsequent encounter: Secondary | ICD-10-CM

## 2019-10-10 MED ORDER — TOPIRAMATE 25 MG PO TABS: 25.0000 mg | ORAL_TABLET | Freq: Two times a day (BID) | ORAL | 1 refills | Status: DC

## 2019-10-10 MED ORDER — GABAPENTIN 300 MG PO CAPS: 300.0000 mg | ORAL_CAPSULE | Freq: Three times a day (TID) | ORAL | 1 refills | Status: DC

## 2019-10-10 NOTE — Progress Notes (Signed)
Subjective:    Patient ID: [name], male    DOB: [birth year], 53 y.o.   MRN: [id number]  HPI Mr. Brilla presents for follow-up of electrocution injury. He has been at home and continues to experience parasthesias with feelings of itchiness and burning pain in his arms and legs. He continues to have weakness in his arms and legs, predominantly his left leg. He is also experiencing migraines.   He has been taking Tylenol for pain control. At times he also experiences vertigo.   He obtained cervical spine MRI and spine appears healthy without evidence of stenosis or nerve compression.   Pain Inventory Average Pain 2 Pain Right Now 1 My pain is sharp, dull, tingling and aching  In the last 24 hours, has pain interfered with the following? General activity 0 Relation with others 0 Enjoyment of life 0 What TIME of day is your pain at its worst? varies Sleep (in general) Poor  Pain is worse with: walking, bending and standing Pain improves with: nothig Relief from Meds: 0  Mobility walk without assistance ability to climb steps?  yes do you drive?  yes  Function employed # of hrs/week . 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  no  Physicians involved in your care Any changes since last visit?  no   Family History  Problem Relation Age of Onset  . Hypertension Mother   . Hypertension Sister   . Hypertension Brother   . Heart attack Cousin   . Cancer Father    Social History   Socioeconomic History  . Marital status: Single    Spouse name: Not on file  . Number of children: Not on file  . Years of education: Not on file  . Highest education level: Not on file  Occupational History  . Not on file  Tobacco Use  . Smoking status: Former Research scientist (life sciences)  . Smokeless tobacco: Never Used  Substance and Sexual Activity  . Alcohol use: No  . Drug use: No  . Sexual  activity: Not on file  Other Topics Concern  . Not on file  Social History Narrative  . Not on file   Social Determinants of Health   Financial Resource Strain:   . Difficulty of Paying Living Expenses: Not on file  Food Insecurity:   . Worried About Charity fundraiser in the Last Year: Not on file  . Ran Out of Food in the Last Year: Not on file  Transportation Needs:   . Lack of Transportation (Medical): Not on file  . Lack of Transportation (Non-Medical): Not on file  Physical Activity:   . Days of Exercise per Week: Not on file  . Minutes of Exercise per Session: Not on file  Stress:   . Feeling of Stress : Not on file  Social Connections:   . Frequency of Communication with Friends and Family: Not on file  . Frequency of Social Gatherings with Friends and Family: Not on file  . Attends Religious Services: Not on file  . Active Member of Clubs or Organizations: Not on file  . Attends Archivist Meetings: Not on file  . Marital Status: Not on file   Past Surgical History:  Procedure Laterality Date  . COLON RESECTION    . HERNIA REPAIR     Past Medical History:  Diagnosis Date  . Cancer [facility])    colon cancer  .  High cholesterol    Temp (!) 97.2 F (36.2 C)   Ht 6' (1.829 m)   Wt 223 lb (101.2 kg)   BMI 30.24 kg/m   Opioid Risk Score:   Fall Risk Score:  `1  Depression screen PHQ 2/9  Depression screen PHQ 2/9 09/24/2019  Decreased Interest 2  Down, Depressed, Hopeless 2  PHQ - 2 Score 4  Altered sleeping 3  Tired, decreased energy 2  Change in appetite 1  Feeling bad or failure about yourself  0  Trouble concentrating 1  Moving slowly or fidgety/restless 2  Suicidal thoughts 0  PHQ-9 Score 13  Difficult doing work/chores Somewhat difficult    Review of Systems  Constitutional: Negative.   HENT: Negative.   Eyes: Negative.   Respiratory: Negative.   Cardiovascular: Negative.   Gastrointestinal: Negative.   Endocrine: Negative.    Genitourinary: Negative.        Spasms   Musculoskeletal: Positive for back pain, myalgias and neck pain.  Skin: Negative.   Allergic/Immunologic: Negative.   Neurological: Positive for weakness and numbness.       Tingling  Hematological: Negative.   Psychiatric/Behavioral: Negative.   All other systems reviewed and are negative.      Objective:   Physical Exam  Gen: no distress, normal appearing HEENT: oral mucosa pink and moist, NCAT Cardio: Reg rate Chest: normal effort, normal rate of breathing Abd: soft, non-distended Ext: no edema Skin: intact Neuro: AOx3. Decreased sensation in bilateral arms and left leg.  Musculoskeletal: 4/5 weakness in his left leg. 5/5 strength in other extremities. Good muscle bulk.  Psych: pleasant, normal affect       Assessment & Plan:  Mr. Mcfeaters presents for follow-up following electrocution injury and work.  Paresthesias/neuropathic pain --Gabapentin 300mg  TID. Advised regarding side effects and expected scheduling; will be reversed for patient since he works at night.  Vertigo --Can consider vertigo therapy if fails to improve. Meclizine not significantly helping.  Headache --Topamax 25mg  BID to prevent migraines.  Can return to work on 12/14 with work restriction of lifting no more than 25 lbs.   Can return to exercise of walking 30 minutes per day and lifting light weights (2-5 lbs) to maintain muscle mass.   20 minutes of face to face patient care time were spent during this visit. All questions were encouraged and answered. Follow up with me in 6 weeks.

## 2019-10-14 ENCOUNTER — Telehealth: Payer: Self-pay

## 2019-10-14 NOTE — Telephone Encounter (Signed)
Patient called and left message stating that he went to work last night and since then has been having pain and discomfort down his neck and midway his back.  Asking if there is anything that can be called in for him.

## 2019-10-15 NOTE — Telephone Encounter (Signed)
ERROR

## 2019-10-17 NOTE — Telephone Encounter (Signed)
I spoke with patient by phone and recommended increasing Gabapentin dosage. Thank you Bruce!

## 2019-10-18 ENCOUNTER — Other Ambulatory Visit: Payer: Self-pay | Admitting: Physical Medicine and Rehabilitation

## 2019-10-18 MED ORDER — MELOXICAM 15 MG PO TABS: 15.0000 mg | ORAL_TABLET | Freq: Every day | ORAL | 0 refills | Status: DC

## 2019-10-21 ENCOUNTER — Telehealth: Payer: Self-pay | Admitting: Physical Medicine and Rehabilitation

## 2019-10-21 DIAGNOSIS — R202 Paresthesia of skin: Secondary | ICD-10-CM

## 2019-10-21 NOTE — Telephone Encounter (Signed)
[name] left me a voicemail saying that he is having continued pain at work. He continues to have electric shocks radiating from his neck downwards. He has been working 7 days per week with restrictions of no more than 25 lbs. It is not so much the weight that bothers him, but moreso the pushing and pulling movements which comprise a large part of his work.  I advised him that I would provide him with a new return to work letter which states that he is recommended to minimize these pushing and pulling movements and to perform light duty until he can be re-evaluated at our appointment next month. He is agreeable to this and will pick up the note from our office tomorrow.

## 2019-11-01 ENCOUNTER — Other Ambulatory Visit: Payer: Self-pay | Admitting: Physical Medicine and Rehabilitation

## 2019-11-11 ENCOUNTER — Other Ambulatory Visit: Payer: Self-pay | Admitting: Physical Medicine and Rehabilitation

## 2019-11-11 DIAGNOSIS — R202 Paresthesia of skin: Secondary | ICD-10-CM

## 2019-11-11 DIAGNOSIS — Y33XXXD Other specified events, undetermined intent, subsequent encounter: Secondary | ICD-10-CM

## 2019-11-11 MED ORDER — TIZANIDINE HCL 4 MG PO TABS: 4.0000 mg | ORAL_TABLET | Freq: Three times a day (TID) | ORAL | 1 refills | Status: DC | PRN

## 2019-11-20 ENCOUNTER — Encounter
Payer: Worker's Compensation | Attending: Physical Medicine and Rehabilitation | Admitting: Physical Medicine and Rehabilitation

## 2019-12-07 ENCOUNTER — Other Ambulatory Visit: Payer: Self-pay | Admitting: Physical Medicine and Rehabilitation

## 2019-12-18 ENCOUNTER — Encounter
Payer: Worker's Compensation | Attending: Physical Medicine and Rehabilitation | Admitting: Physical Medicine and Rehabilitation

## 2019-12-18 ENCOUNTER — Other Ambulatory Visit: Payer: Self-pay

## 2019-12-18 ENCOUNTER — Encounter: Payer: Self-pay | Admitting: Physical Medicine and Rehabilitation

## 2019-12-18 VITALS — BP 147/86 | HR 68 | Temp 97.5°F | Ht 72.0 in | Wt 212.8 lb

## 2019-12-18 DIAGNOSIS — M7918 Myalgia, other site: Secondary | ICD-10-CM | POA: Diagnosis not present

## 2019-12-18 DIAGNOSIS — G8929 Other chronic pain: Secondary | ICD-10-CM | POA: Diagnosis present

## 2019-12-18 DIAGNOSIS — M5442 Lumbago with sciatica, left side: Secondary | ICD-10-CM

## 2019-12-18 DIAGNOSIS — M47812 Spondylosis without myelopathy or radiculopathy, cervical region: Secondary | ICD-10-CM | POA: Diagnosis present

## 2019-12-18 MED ORDER — GABAPENTIN 300 MG PO CAPS: 300.0000 mg | ORAL_CAPSULE | Freq: Three times a day (TID) | ORAL | 1 refills | Status: DC

## 2019-12-18 MED ORDER — GABAPENTIN 600 MG PO TABS: 600.0000 mg | ORAL_TABLET | Freq: Three times a day (TID) | ORAL | 1 refills | Status: DC

## 2019-12-18 NOTE — Progress Notes (Signed)
Subjective:    Patient ID: [name], male    DOB: 12-Apr-1966, 54 y.o.   MRN: [id number]  HPI  Mr. Nick presents for follow-up of electrocution injury. He continues to have headaches at work and has been taking the Topiramate. He asks how often he should be taking this. He has been taking the Gabapentin three times daily for his painful parasthesias. He also takes the Tizanidine to help with his neck muscle spasms before sleeping--he cannot take this during the day as it makes him too sleepy.   He obtained cervical spine MRI and spine appears healthy without evidence of stenosis or nerve compression. He has never had a lumbar XR but does report new symptoms of lower back pain with radiation into his right anterior thigh.   He asks about what exercises he should be doing. He used to be very active, going to the gym daily, and now exercises a lot less and feels he is losing strength as a results.  He shares that he works 12-14 hour days 6-7 days per week and when he pushes or pulls things he often feels a crick in his neck.   Pain Inventory Average Pain 5 Pain Right Now 1 My pain is sharp, dull, stabbing, tingling and aching  In the last 24 hours, has pain interfered with the following? General activity 1 Relation with others 1 Enjoyment of life 1 What TIME of day is your pain at its worst? varies Sleep (in general) Poor  Pain is worse with: walking, bending and standing Pain improves with: medication Relief from Meds: 5  Mobility how many minutes can you walk? unlimited ability to climb steps?  yes do you drive?  yes  Function employed # of hrs/week .  Neuro/Psych weakness numbness tingling trouble walking spasms dizziness confusion  Prior Studies Any changes since last visit?  no  Physicians involved in your care Any changes since last visit?  no   Family History  Problem Relation Age of Onset  . Hypertension Mother   . Hypertension Sister   .  Hypertension Brother   . Heart attack Cousin   . Cancer Father    Social History   Socioeconomic History  . Marital status: Single    Spouse name: Not on file  . Number of children: Not on file  . Years of education: Not on file  . Highest education level: Not on file  Occupational History  . Not on file  Tobacco Use  . Smoking status: Former Research scientist (life sciences)  . Smokeless tobacco: Never Used  Substance and Sexual Activity  . Alcohol use: No  . Drug use: No  . Sexual activity: Not on file  Other Topics Concern  . Not on file  Social History Narrative  . Not on file   Social Determinants of Health   Financial Resource Strain:   . Difficulty of Paying Living Expenses: Not on file  Food Insecurity:   . Worried About Charity fundraiser in the Last Year: Not on file  . Ran Out of Food in the Last Year: Not on file  Transportation Needs:   . Lack of Transportation (Medical): Not on file  . Lack of Transportation (Non-Medical): Not on file  Physical Activity:   . Days of Exercise per Week: Not on file  . Minutes of Exercise per Session: Not on file  Stress:   . Feeling of Stress : Not on file  Social Connections:   . Frequency of Communication  with Friends and Family: Not on file  . Frequency of Social Gatherings with Friends and Family: Not on file  . Attends Religious Services: Not on file  . Active Member of Clubs or Organizations: Not on file  . Attends Archivist Meetings: Not on file  . Marital Status: Not on file   Past Surgical History:  Procedure Laterality Date  . COLON RESECTION    . HERNIA REPAIR     Past Medical History:  Diagnosis Date  . Cancer [facility])    colon cancer  . High cholesterol    BP (!) 147/86   Pulse 68   Temp (!) 97.5 F (36.4 C)   Ht 6' (1.829 m)   Wt 212 lb 12.8 oz (96.5 kg)   SpO2 97%   BMI 28.86 kg/m   Opioid Risk Score:   Fall Risk Score:  `1  Depression screen PHQ 2/9  Depression screen [facility] 2/9 12/18/2019 09/24/2019    Decreased Interest 0 2  Down, Depressed, Hopeless 0 2  PHQ - 2 Score 0 4  Altered sleeping - 3  Tired, decreased energy - 2  Change in appetite - 1  Feeling bad or failure about yourself  - 0  Trouble concentrating - 1  Moving slowly or fidgety/restless - 2  Suicidal thoughts - 0  PHQ-9 Score - 13  Difficult doing work/chores - Somewhat difficult   Review of Systems  Gastrointestinal: Positive for nausea.  Musculoskeletal: Positive for back pain, gait problem and neck pain.  Neurological: Positive for dizziness, weakness and numbness.  Psychiatric/Behavioral: Positive for confusion.  All other systems reviewed and are negative.      Objective:   Physical Exam Gen: no distress, normal appearing HEENT: oral mucosa pink and moist, NCAT Cardio: Reg rate Chest: normal effort, normal rate of breathing Abd: soft, non-distended Ext: no edema Skin: intact Neuro:AOx3.Decreased sensation in bilateral arms and left leg.  Musculoskeletal:5/5 strength throughout with the exception of right leg which has 4/5 strength. Good muscle bulk.  Psych: pleasant, normal affect        Assessment & Plan:  Mr. Sherling presents for follow-up after electrocution injury and work which has resulted in neck and lower back pain.   Paresthesias/neuropathic pain --Gabapentin 600mg  TID. Advised regarding side effects and expected scheduling; will be reversed for patient since he works at night.  Vertigo --Can consider vertigo therapy if fails to improve. Meclizine not significantly helping.  Cervical myofascial pain syndrome -Continue Tizanidine before sleeping as needed for muscle spasms. -Provided referral for external physical therapy for stretching and strengthening of the muscles of neck and upper back  -Advised that he can slowly return to former activities starting with walking, then weightlifitng at the gym, and then basketball.   Lower back pain: --Concerning for nerve impingement  given radiating pain into right thigh. Recommend XR of lumbar spine for further assessment.   RTC in 2 months to assess progress with above interventions.   Headache --Topamax 25mg  BID to prevent migraines.  Can return to work on 12/14 with work restriction of lifting no more than 25 lbs.   Can return to exercise of walking 30 minutes per day and lifting light weights (2-5 lbs) to maintain muscle mass.   20 minutes of face to face patient care time were spent during this visit. All questions were encouraged and answered. Follow up with me in 6 weeks.

## 2020-01-01 ENCOUNTER — Telehealth: Payer: Self-pay | Admitting: *Deleted

## 2020-01-01 NOTE — Telephone Encounter (Signed)
Left message with my cell phone for call back; thanks Yvone Neu!

## 2020-01-01 NOTE — Telephone Encounter (Signed)
[name]  PT and [employer] left a message stating that patient presented for a PT evaluation and they questions about an order for TENS unit.

## 2020-01-02 ENCOUNTER — Other Ambulatory Visit: Payer: Self-pay | Admitting: Physical Medicine and Rehabilitation

## 2020-01-03 NOTE — Telephone Encounter (Signed)
Spoke with Dr. Ranell Patrick and she says she spoke to [facility] and Ind re[facility].  Questions answered, case closed

## 2020-01-06 ENCOUNTER — Other Ambulatory Visit: Payer: Self-pay | Admitting: Physical Medicine and Rehabilitation

## 2020-01-31 ENCOUNTER — Other Ambulatory Visit: Payer: Self-pay | Admitting: Physical Medicine and Rehabilitation

## 2020-02-14 ENCOUNTER — Encounter
Payer: Worker's Compensation | Attending: Physical Medicine and Rehabilitation | Admitting: Physical Medicine and Rehabilitation

## 2020-02-14 ENCOUNTER — Encounter: Payer: Self-pay | Admitting: Physical Medicine and Rehabilitation

## 2020-02-14 ENCOUNTER — Other Ambulatory Visit: Payer: Self-pay

## 2020-02-14 VITALS — BP 140/77 | HR 61 | Temp 97.5°F | Ht 72.0 in | Wt 210.0 lb

## 2020-02-14 DIAGNOSIS — R531 Weakness: Secondary | ICD-10-CM | POA: Diagnosis not present

## 2020-02-14 DIAGNOSIS — M545 Low back pain: Secondary | ICD-10-CM | POA: Diagnosis present

## 2020-02-14 DIAGNOSIS — M7918 Myalgia, other site: Secondary | ICD-10-CM | POA: Insufficient documentation

## 2020-02-14 DIAGNOSIS — Y33XXXD Other specified events, undetermined intent, subsequent encounter: Secondary | ICD-10-CM

## 2020-02-14 DIAGNOSIS — R200 Anesthesia of skin: Secondary | ICD-10-CM | POA: Diagnosis not present

## 2020-02-14 DIAGNOSIS — G44209 Tension-type headache, unspecified, not intractable: Secondary | ICD-10-CM | POA: Diagnosis not present

## 2020-02-14 DIAGNOSIS — R42 Dizziness and giddiness: Secondary | ICD-10-CM | POA: Diagnosis not present

## 2020-02-14 DIAGNOSIS — G44229 Chronic tension-type headache, not intractable: Secondary | ICD-10-CM

## 2020-02-14 DIAGNOSIS — Z87891 Personal history of nicotine dependence: Secondary | ICD-10-CM | POA: Insufficient documentation

## 2020-02-14 DIAGNOSIS — R29898 Other symptoms and signs involving the musculoskeletal system: Secondary | ICD-10-CM

## 2020-02-14 DIAGNOSIS — R410 Disorientation, unspecified: Secondary | ICD-10-CM | POA: Insufficient documentation

## 2020-02-14 DIAGNOSIS — R202 Paresthesia of skin: Secondary | ICD-10-CM | POA: Diagnosis not present

## 2020-02-14 MED ORDER — GABAPENTIN 800 MG PO TABS: 800.0000 mg | ORAL_TABLET | Freq: Three times a day (TID) | ORAL | 3 refills | Status: DC

## 2020-02-14 MED ORDER — TOPIRAMATE 25 MG PO TABS: 25.0000 mg | ORAL_TABLET | Freq: Two times a day (BID) | ORAL | 1 refills | Status: DC

## 2020-02-14 NOTE — Progress Notes (Signed)
Subjective:    Patient ID: [name], male    DOB: January 25, 1966, 54 y.o.   MRN: [id number]  HPI  [name] presents for follow-up of electrocution injury. He continues to have headaches at work and has been taking the Topiramate three times per day, which does provide relief. The headaches are located in the frontal or occipital lobes and are not associated with nausea, photophobia, or phonophobia. He asks how often he should be taking this. He has been taking the Gabapentin 600mg  three times daily for his painful parasthesias and it does help. It does not make him drowsy. He no longer takes the Tizanidine.   He obtained cervical spine MRI and spine appears healthy without evidence of stenosis or nerve compression.He has never had a lumbar XR but does report continued symptoms of lower back pain with radiation into his right anterior thigh.   He asks about what exercises he should be doing. He used to be very active, going to the gym daily, and now exercises a lot less and feels he is losing strength as a results.  He shares that he works 12-14 hour days 6-7 days per week and when he pushes or pulls things he often feels a crick in his neck.   Pain Inventory Average Pain 6 Pain Right Now 2 My pain is sharp, burning, dull, tingling and aching  In the last 24 hours, has pain interfered with the following? General activity 1 Relation with others 1 Enjoyment of life 1 What TIME of day is your pain at its worst? night Sleep (in general) Poor  Pain is worse with: bending and standing Pain improves with: rest and therapy/exercise Relief from Meds: 2  Mobility walk without assistance ability to climb steps?  yes do you drive?  yes  Function employed # of hrs/week 40+  Neuro/Psych weakness numbness tingling spasms dizziness confusion depression  Prior Studies Any changes since last visit?  yes  Physicians involved in your care Any changes since last visit?   no   Family History  Problem Relation Age of Onset  . Hypertension Mother   . Hypertension Sister   . Hypertension Brother   . Heart attack Cousin   . Cancer Father    Social History   Socioeconomic History  . Marital status: Single    Spouse name: Not on file  . Number of children: Not on file  . Years of education: Not on file  . Highest education level: Not on file  Occupational History  . Not on file  Tobacco Use  . Smoking status: Former Research scientist (life sciences)  . Smokeless tobacco: Never Used  Substance and Sexual Activity  . Alcohol use: No  . Drug use: No  . Sexual activity: Not on file  Other Topics Concern  . Not on file  Social History Narrative  . Not on file   Social Determinants of Health   Financial Resource Strain:   . Difficulty of Paying Living Expenses:   Food Insecurity:   . Worried About Charity fundraiser in the Last Year:   . Arboriculturist in the Last Year:   Transportation Needs:   . Film/video editor (Medical):   Marland Kitchen Lack of Transportation (Non-Medical):   Physical Activity:   . Days of Exercise per Week:   . Minutes of Exercise per Session:   Stress:   . Feeling of Stress :   Social Connections:   . Frequency of Communication with Friends and  Family:   . Frequency of Social Gatherings with Friends and Family:   . Attends Religious Services:   . Active Member of Clubs or Organizations:   . Attends Archivist Meetings:   Marland Kitchen Marital Status:    Past Surgical History:  Procedure Laterality Date  . COLON RESECTION    . HERNIA REPAIR     Past Medical History:  Diagnosis Date  . Cancer [facility])    colon cancer  . High cholesterol    BP 140/77   Pulse 61   Temp (!) 97.5 F (36.4 C)   Ht 6' (1.829 m)   Wt 210 lb (95.3 kg)   SpO2 96%   BMI 28.48 kg/m   Opioid Risk Score:   Fall Risk Score:  `1  Depression screen PHQ 2/9  Depression screen [facility] 2/9 02/14/2020 12/18/2019 09/24/2019  Decreased Interest 1 0 2  Down, Depressed,  Hopeless 1 0 2  PHQ - 2 Score 2 0 4  Altered sleeping - - 3  Tired, decreased energy - - 2  Change in appetite - - 1  Feeling bad or failure about yourself  - - 0  Trouble concentrating - - 1  Moving slowly or fidgety/restless - - 2  Suicidal thoughts - - 0  PHQ-9 Score - - 13  Difficult doing work/chores - - Somewhat difficult    Review of Systems  Constitutional: Positive for unexpected weight change.       Not trying but loosing weight  HENT: Negative.   Eyes: Negative.   Respiratory: Negative.   Cardiovascular: Negative.   Gastrointestinal: Negative.   Endocrine: Negative.   Genitourinary: Negative.   Musculoskeletal: Positive for gait problem.       Spasms  Skin: Negative.   Allergic/Immunologic: Negative.   Neurological: Positive for weakness and numbness.       Tingling  Hematological: Negative.   Psychiatric/Behavioral: Positive for confusion and dysphoric mood.  All other systems reviewed and are negative.      Objective:   Physical Exam Gen: no distress, normal appearing HEENT: oral mucosa pink and moist, NCAT Cardio: Reg rate Chest: normal effort, normal rate of breathing Abd: soft, non-distended Ext: no edema Skin: intact Neuro:AOx3.Decreased sensation in bilateral arms and left leg. Musculoskeletal:5/5 strength throughout with the exception of right leg which has 4/5 strength. Good muscle bulk.Antalgic gait with dragging of right leg.  Psych: pleasant, normal affect    Assessment & Plan:  [name] presents for follow-up after electrocution injury and work which has resulted in neck and lower back pain.   Paresthesias/neuropathic pain --Gabapentin: increased to 800mg  TID. Advised regarding side effects and expected scheduling; will be reversed for patient since he works at night.  Vertigo --Can consider vertigo therapy if fails to improve. Continue Meclizine as needed.   Cervical myofascial pain syndrome -No longer taking Tizanidine.   -Continue physical therapy for stretching and strengthening of the muscles of neck and upper back. Reviewed notes.  -Advised that he can slowly return to former activities starting with walking, then weightlifitng at the gym, and then basketball.   Lower back pain: --Concerning for nerve impingement given radiating pain into right thigh. Recommend XR of lumbar spine for further assessment.   Headaches, tension type: Provided refill of Topiramate; increased to TID.   RTC in 1 month to assess progress with above interventions.

## 2020-02-17 ENCOUNTER — Ambulatory Visit: Payer: BC Managed Care – PPO | Admitting: Physical Medicine and Rehabilitation

## 2020-02-21 ENCOUNTER — Telehealth: Payer: Self-pay

## 2020-02-21 NOTE — Telephone Encounter (Signed)
[facility] requested a 20 minute phone consult with KR-- I faxed IME to attorney office - upon receipt of check please set up 20 minute appt for patient - with patient no needing to present but book phone call/webex for MD

## 2020-03-03 ENCOUNTER — Other Ambulatory Visit: Payer: Self-pay

## 2020-03-03 ENCOUNTER — Ambulatory Visit (HOSPITAL_COMMUNITY)
Admission: RE | Admit: 2020-03-03 | Discharge: 2020-03-03 | Disposition: A | Discharge status: Home / Self Care | Payer: BC Managed Care – PPO | Source: Ambulatory Visit | Attending: Physical Medicine and Rehabilitation | Admitting: Physical Medicine and Rehabilitation

## 2020-03-03 DIAGNOSIS — R29898 Other symptoms and signs involving the musculoskeletal system: Secondary | ICD-10-CM | POA: Insufficient documentation

## 2020-03-17 ENCOUNTER — Encounter
Payer: Worker's Compensation | Attending: Physical Medicine and Rehabilitation | Admitting: Physical Medicine and Rehabilitation

## 2020-03-17 ENCOUNTER — Other Ambulatory Visit: Payer: Self-pay

## 2020-03-17 ENCOUNTER — Encounter: Payer: Self-pay | Admitting: Physical Medicine and Rehabilitation

## 2020-03-17 VITALS — BP 147/86 | HR 68 | Temp 97.5°F | Ht 72.0 in | Wt 213.6 lb

## 2020-03-17 DIAGNOSIS — M545 Low back pain: Secondary | ICD-10-CM | POA: Diagnosis present

## 2020-03-17 DIAGNOSIS — M7918 Myalgia, other site: Secondary | ICD-10-CM | POA: Insufficient documentation

## 2020-03-17 DIAGNOSIS — G5712 Meralgia paresthetica, left lower limb: Secondary | ICD-10-CM | POA: Diagnosis not present

## 2020-03-17 DIAGNOSIS — G44209 Tension-type headache, unspecified, not intractable: Secondary | ICD-10-CM | POA: Diagnosis not present

## 2020-03-17 DIAGNOSIS — I700 Atherosclerosis of aorta: Secondary | ICD-10-CM | POA: Diagnosis not present

## 2020-03-17 DIAGNOSIS — Z8679 Personal history of other diseases of the circulatory system: Secondary | ICD-10-CM | POA: Diagnosis not present

## 2020-03-17 DIAGNOSIS — Z8249 Family history of ischemic heart disease and other diseases of the circulatory system: Secondary | ICD-10-CM | POA: Insufficient documentation

## 2020-03-17 DIAGNOSIS — M4802 Spinal stenosis, cervical region: Secondary | ICD-10-CM

## 2020-03-17 DIAGNOSIS — M47818 Spondylosis without myelopathy or radiculopathy, sacral and sacrococcygeal region: Secondary | ICD-10-CM | POA: Insufficient documentation

## 2020-03-17 DIAGNOSIS — R531 Weakness: Secondary | ICD-10-CM | POA: Insufficient documentation

## 2020-03-17 DIAGNOSIS — H8113 Benign paroxysmal vertigo, bilateral: Secondary | ICD-10-CM | POA: Diagnosis not present

## 2020-03-17 DIAGNOSIS — Z85038 Personal history of other malignant neoplasm of large intestine: Secondary | ICD-10-CM | POA: Diagnosis not present

## 2020-03-17 DIAGNOSIS — W868XXD Exposure to other electric current, subsequent encounter: Secondary | ICD-10-CM | POA: Insufficient documentation

## 2020-03-17 DIAGNOSIS — Y33XXXD Other specified events, undetermined intent, subsequent encounter: Secondary | ICD-10-CM

## 2020-03-17 DIAGNOSIS — R42 Dizziness and giddiness: Secondary | ICD-10-CM | POA: Insufficient documentation

## 2020-03-17 DIAGNOSIS — M47816 Spondylosis without myelopathy or radiculopathy, lumbar region: Secondary | ICD-10-CM | POA: Diagnosis not present

## 2020-03-17 DIAGNOSIS — E7800 Pure hypercholesterolemia, unspecified: Secondary | ICD-10-CM | POA: Insufficient documentation

## 2020-03-17 DIAGNOSIS — G629 Polyneuropathy, unspecified: Secondary | ICD-10-CM | POA: Insufficient documentation

## 2020-03-17 MED ORDER — MECLIZINE HCL 12.5 MG PO TABS: 12.5000 mg | ORAL_TABLET | Freq: Three times a day (TID) | ORAL | 3 refills | Status: DC | PRN

## 2020-03-17 NOTE — Progress Notes (Signed)
Subjective:    Patient ID: [name], male    DOB: Nov 10, 1965, 54 y.o.   MRN: [id number]  HPI  HPI  [name] presents for follow-up of electrocution injury.He continues to have headaches at work and has been taking the Topiramate three times per day, which does provide relief. The headaches are located in the frontal or occipital lobes and are not associated with nausea, photophobia, or phonophobia. He has been taking the Gabapentin 800mg  three times daily for his painful parasthesias and it does help. It does not make him drowsy. He no longer takes the Tizanidine as it makes him sleepy and his pain is usually present when he is at work.   He obtained cervical spine MRI and spine appears healthy without evidence of stenosis or nerve compression.He has never had a lumbar XR but does report continued symptoms of lower back pain with radiation into his right anterior thigh. He also has numbness periodically in his left thigh.   He has asked about what exercises he should be doing. He used to be very active, going to the gym daily, and now exercises a lot less and feels he is losing strength as a result. Since last visit he has not been able to return to the gym much due to his pain in his neck and lower back.   He shares that he works 12-14 hour days 6-7 days per week and when he pushes or pulls things he often feels a crick in his neck.He has completed outpatient physical therapy for his neck stiffness and cervical myofascial pain with moderate benefit. Pain has decreased from 6/10 last visit to 3/10 this visit.   Pain Inventory Average Pain 3 Pain Right Now 5 My pain is intermittent, sharp, dull, tingling and aching  In the last 24 hours, has pain interfered with the following? General activity 4 Relation with others 4 Enjoyment of life 4 What TIME of day is your pain at its worst? night Sleep (in general) Poor  Pain is worse with: bending, sitting and standing Pain  improves with: medication Relief from Meds: 4  Mobility walk without assistance ability to climb steps?  yes do you drive?  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  no  Physicians involved in your care Any changes since last visit?  no   Family History  Problem Relation Age of Onset  . Hypertension Mother   . Hypertension Sister   . Hypertension Brother   . Heart attack Cousin   . Cancer Father    Social History   Socioeconomic History  . Marital status: Single    Spouse name: Not on file  . Number of children: Not on file  . Years of education: Not on file  . Highest education level: Not on file  Occupational History  . Not on file  Tobacco Use  . Smoking status: Former Research scientist (life sciences)  . Smokeless tobacco: Never Used  Substance and Sexual Activity  . Alcohol use: No  . Drug use: No  . Sexual activity: Not on file  Other Topics Concern  . Not on file  Social History Narrative  . Not on file   Social Determinants of Health   Financial Resource Strain:   . Difficulty of Paying Living Expenses:   Food Insecurity:   . Worried About [employer] of  Food in the Last Year:   . [place] in the Last Year:   Transportation Needs:   . Film/video editor (Medical):   Marland Kitchen Lack of Transportation (Non-Medical):   Physical Activity:   . Days of Exercise per Week:   . Minutes of Exercise per Session:   Stress:   . Feeling of Stress :   Social Connections:   . Frequency of Communication with Friends and Family:   . Frequency of Social Gatherings with Friends and Family:   . Attends Religious Services:   . Active Member of Clubs or Organizations:   . Attends Archivist Meetings:   Marland Kitchen Marital Status:    Past Surgical History:  Procedure Laterality Date  . COLON  RESECTION    . HERNIA REPAIR     Past Medical History:  Diagnosis Date  . Cancer [facility])    colon cancer  . High cholesterol    BP (!) 147/86   Pulse 68   Temp (!) 97.5 F (36.4 C)   Ht 6' (1.829 m)   Wt 213 lb 9.6 oz (96.9 kg)   SpO2 98%   BMI 28.97 kg/m   Opioid Risk Score:   Fall Risk Score:  `1  Depression screen PHQ 2/9  Depression screen [facility] 2/9 03/17/2020 02/14/2020 12/18/2019 09/24/2019  Decreased Interest 1 1 0 2  Down, Depressed, Hopeless 1 1 0 2  PHQ - 2 Score 2 2 0 4  Altered sleeping - - - 3  Tired, decreased energy - - - 2  Change in appetite - - - 1  Feeling bad or failure about yourself  - - - 0  Trouble concentrating - - - 1  Moving slowly or fidgety/restless - - - 2  Suicidal thoughts - - - 0  PHQ-9 Score - - - 13  Difficult doing work/chores - - - Somewhat difficult    Review of Systems  Constitutional: Negative.   HENT: Negative.   Eyes: Negative.   Respiratory: Negative.   Cardiovascular: Negative.   Gastrointestinal: Negative.   Endocrine: Negative.   Genitourinary: Negative.   Musculoskeletal: Positive for back pain, gait problem and neck pain.       Spasms  Skin: Negative.   Allergic/Immunologic: Negative.   Neurological: Positive for weakness.  Hematological: Negative.   Psychiatric/Behavioral: Positive for confusion and dysphoric mood.  All other systems reviewed and are negative.      Objective:   Physical Exam Gen: no distress, normal appearing HEENT: oral mucosa pink and moist, NCAT Cardio: Reg rate Chest: normal effort, normal rate of breathing Abd: soft, non-distended Ext: no edema Skin: intact Neuro:AOx3.Decreased sensation in bilateral arms and left leg. Musculoskeletal:5/5 strength throughout with the exception of right leg which has 4/5 strength.Good muscle bulk.Antalgic gait with dragging of right leg. Decreased sensation over left anterior thigh.  Psych: pleasant, normal affect    Assessment & Plan:  [name] presents for follow-upafterelectrocution injury at work which has resulted in neck and lower back pain, parasthesias, and weakness.   Paresthesias/neuropathic pain --Gabapentin: continue 800mg  TID. Advised regarding side effects and expected scheduling; will be reversed for patient since he works at night.  Vertigo --Continuing to experience regular symptoms. Provided referral for vestibular therapy. Provided refill of Meclizine.  Cervical myofascial pain syndrome -No longer taking Tizanidine.  -Continue HEP of stretching and strengthening of the muscles of neck and upper back, which he is doing regularly.  -Advised that he can slowly  return to former activities starting with walking, then weightlifitng at the gym, and then basketball. He has still not yet returned to these activities due to his pain.   Discussed results of  XR of lumbar spine, which shows evidence of the following:  1. Mild degenerative changes L4-L5. No acute abnormality identified. No evidence of fracture.  2. Degenerative changes both SI joints. Mild changes of bilateral sacroiliitis cannot be excluded.  3. 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  Provided referral to cardiology given evidence of vascular disease.   Headaches, tension type: Provided refill of Topiramate; continue TID.   RTC in 1 month to assess progress with above interventions.

## 2020-03-18 ENCOUNTER — Ambulatory Visit (INDEPENDENT_AMBULATORY_CARE_PROVIDER_SITE_OTHER): Payer: BC Managed Care – PPO | Admitting: Family Medicine

## 2020-03-18 ENCOUNTER — Encounter: Payer: Self-pay | Admitting: Family Medicine

## 2020-03-18 VITALS — BP 158/90 | HR 58 | Ht 72.0 in | Wt 214.0 lb

## 2020-03-18 DIAGNOSIS — R030 Elevated blood-pressure reading, without diagnosis of hypertension: Secondary | ICD-10-CM | POA: Diagnosis not present

## 2020-03-18 DIAGNOSIS — R079 Chest pain, unspecified: Secondary | ICD-10-CM | POA: Diagnosis not present

## 2020-03-18 DIAGNOSIS — I739 Peripheral vascular disease, unspecified: Secondary | ICD-10-CM

## 2020-03-18 MED ORDER — AMLODIPINE BESYLATE 5 MG PO TABS
5.0000 mg | ORAL_TABLET | Freq: Every day | ORAL | 1 refills | Status: DC
Start: 2020-03-18 — End: 2021-01-23

## 2020-03-18 NOTE — Progress Notes (Addendum)
Cardiology Office Note  Date: 03/18/2020   ID: [name] 1966/10/17, MRN [id number]  PCP:  Thomas Chroman, MD  Cardiologist:  Thomas Dolly, MD Electrophysiologist:  None   Chief Complaint: Follow-up chest pain  History of Present Illness: [name] is a 54 y.o. male with a history of chest pain, hyperlipidemia, former smoker, elevated blood pressure.  Last seen by [name] on 12/21/2017 as a new consult by referral from PCP.  He was seen in emergency room November 27, 2017 with atypical chest pain.  His EKG showed LVH, strain pattern, troponins negative x2.  Chest x-ray small right effusion, left-sided pressure-like pain radiating to the shoulder blade needles-like feeling.  He had no other associated symptoms.  Worse with position changes.  Better with ibuprofen symptoms had resolved at the time of exam.  He had been on Lipitor several years prior.  His blood pressure was elevated on arrival.  He was taking ibuprofen at home for ongoing back pain.   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  Patient denies any exertional chest pain.  He states he has neck pain.  He recently had an electrocution-like shock and is in rehab.  He admits he has pain in the legs when he walks and at rest.  Also complains of some leg numbness mostly on the left side.  He denies any progressive anginal or exertional symptoms.  States he sometimes feels dizzy but no presyncopal or syncopal episodes.  He has chronic pain and has been prescribed Topamax, gabapentin, tizanidine.  Recently prescribed meclizine for vertigo-like symptoms.  He was referred by physical rehab medicine to undergo vestibular therapy.  Recent x-rays of lumbar spine showed evidence of mild degenerative changes of L4-L5.  Degenerative changes of both SI joints.  Mild changes of bilateral sacroiliitis could not be excluded.   His blood pressure is elevated today.  Last 3 recorded blood pressure have been greater than [id number] systolic.   Past Medical History:  Diagnosis Date  . Cancer [facility])    colon cancer  . High cholesterol     Past Surgical History:  Procedure Laterality Date  . COLON RESECTION    . HERNIA REPAIR      Current Outpatient Medications  Medication Sig Dispense Refill  . gabapentin (NEURONTIN) 800 MG tablet Take 1 tablet (800 mg total) by mouth 3 (three) times daily. 90 tablet 3  . loratadine (CLARITIN) 10 MG tablet Take 10 mg by mouth daily as needed for allergies.    Marland Kitchen meclizine (ANTIVERT) 12.5 MG tablet Take 1 tablet (12.5 mg total) by mouth 3 (three) times daily as needed for dizziness. 90 tablet 3  . senna-docusate (SENOKOT-S) 8.6-50 MG tablet Take 1 tablet by mouth daily.    Marland Kitchen tiZANidine (ZANAFLEX) 4 MG tablet TAKE 1 TABLET (4 MG TOTAL) BY MOUTH EVERY 8 (EIGHT) HOURS AS NEEDED FOR MUSCLE SPASMS. 90 tablet 2  . topiramate (TOPAMAX) 25 MG tablet Take 1 tablet (25 mg total) by mouth 2 (two) times daily. 90 tablet 1  . amLODipine (NORVASC) 5 MG tablet Take 1 tablet (5 mg total) by mouth daily. 90 tablet 1   No current facility-administered medications for this visit.   Allergies:  Patient has no known allergies.   Social History: The patient  reports that he has quit smoking. He has never used smokeless tobacco. He reports that he does not drink alcohol or  use drugs.   Family History: The patient's family history includes Cancer in his father; Heart attack in his cousin; Hypertension in his brother, mother, and sister.   ROS:  Please see the history of present illness. Otherwise, complete review of systems is positive for none.  All other systems are reviewed and negative.   Physical Exam: VS:  BP (!) 158/90   Pulse (!) 58   Ht 6' (1.829 m)   Wt 214 lb (97.1 kg)   SpO2 98%   BMI 29.02 kg/m , BMI Body mass index is 29.02 kg/m.  Wt Readings from Last 3 Encounters:  03/18/20 214 lb  (97.1 kg)  03/17/20 213 lb 9.6 oz (96.9 kg)  02/14/20 210 lb (95.3 kg)    General: Patient appears comfortable at rest. Neck: Supple, no elevated JVP or carotid bruits, no thyromegaly. Lungs: Clear to auscultation, nonlabored breathing at rest. Cardiac: Regular rate and rhythm, no S3 or significant systolic murmur, no pericardial rub. Extremities: No pitting edema, distal pulses 2+. Skin: Warm and dry. Musculoskeletal: No kyphosis. Neuropsychiatric: Alert and oriented x3, affect grossly appropriate.  ECG:    Recent Labwork: 09/24/2019: BUN 10; Creatinine, Ser 1.37; Hemoglobin 13.1; Platelets 270; Potassium 4.3; Sodium 141  No results found for: CHOL, TRIG, HDL, CHOLHDL, VLDL, LDLCALC, LDLDIRECT  Other Studies Reviewed Today:   Assessment and Plan:  1. Chest pain of uncertain etiology   2. Elevated blood pressure reading   3. Claudication ([place])    1. Chest pain of uncertain etiology Patient previously complaining of chest pain.  Today patient denies any exertional chest pain.  Does complain of some neck pain.  2. Elevated blood pressure reading .Patient has elevated blood pressure today.  His blood pressure has been elevated on 2 previous checks.  He does have a strong family history with siblings with high blood pressure and high blood pressure and his mother.  Start amlodipine 5 mg p.o. daily.  Start checking your blood pressure every day approximately 2-3 hours after taking your medication.  3. 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 Please perform vascular ultrasound of lower extremities with ABI due to claudication-like pain.  Patient states he has issues with leg pain extending from hips down to lower extremities with some numbness in his left leg.  States the pain has no particular pattern.  States he constantly has to readjust himself when sitting to ease the  pain.  He recently had LS-spine x-rays showing L4/L5 disease with SI joint disease mild according to interpretation.  Medication Adjustments/Labs and Tests Ordered: Current medicines are reviewed at length with the patient today.  Concerns regarding medicines are outlined above.   Disposition: Follow-up with [name] or APP 1 month  Signed, Thomas July, NP 03/18/2020 3:07 PM    [facility] at [address] [place], [place], [place] [address] Phone: [phone]; Fax: [phone]

## 2020-03-18 NOTE — Patient Instructions (Addendum)
Medication Instructions:   Your physician has recommended you make the following change in your medication:   Start amlodipine 5 mg by mouth daily  Continue other medications the same  Labwork:  NONE  Testing/Procedures:  Your physician has requested that you have a lower extremity arterial exercise duplex. During this test, exercise and ultrasound are used to evaluate arterial blood flow in the legs. Allow one hour for this exam. There are no restrictions or special instructions. Your physician has requested that you have an ankle brachial index (ABI). During this test an ultrasound and blood pressure cuff are used to evaluate the arteries that supply the arms and legs with blood. Allow thirty minutes for this exam. There are no restrictions or special instructions.  Follow-Up:  Your physician recommends that you schedule a follow-up appointment in: 1 month (office).  Any Other Special Instructions Will Be Listed Below (If Applicable).  If you need a refill on your cardiac medications before your next appointment, please call your pharmacy.

## 2020-03-19 ENCOUNTER — Ambulatory Visit: Payer: BC Managed Care – PPO | Admitting: Family Medicine

## 2020-03-26 ENCOUNTER — Other Ambulatory Visit: Payer: Self-pay | Admitting: Family Medicine

## 2020-03-26 DIAGNOSIS — I739 Peripheral vascular disease, unspecified: Secondary | ICD-10-CM

## 2020-04-01 ENCOUNTER — Other Ambulatory Visit: Payer: Self-pay | Admitting: Physical Medicine and Rehabilitation

## 2020-04-22 ENCOUNTER — Other Ambulatory Visit: Payer: Self-pay

## 2020-04-22 ENCOUNTER — Ambulatory Visit (INDEPENDENT_AMBULATORY_CARE_PROVIDER_SITE_OTHER): Payer: BC Managed Care – PPO

## 2020-04-22 DIAGNOSIS — I739 Peripheral vascular disease, unspecified: Secondary | ICD-10-CM | POA: Diagnosis not present

## 2020-04-23 NOTE — Progress Notes (Signed)
Cardiology Office Note  Date: 04/24/2020   ID: Thomas, Pace Mar 31, 1966, MRN [id number]  PCP:  Glenda Chroman, MD  Cardiologist:  Carlyle Dolly, MD Electrophysiologist:  None   Chief Complaint: Follow-up chest pain  History of Present Illness: [name] is a 54 y.o. male with a history of chest pain, hyperlipidemia, former smoker, elevated blood pressure.  Last seen by Dr. Harl Bowie on 12/21/2017 as a new consult by referral from PCP.  He was seen in emergency room November 27, 2017 with atypical chest pain.  His EKG showed LVH, strain pattern, troponins negative x2.  Chest x-ray small right effusion, left-sided pressure-like pain radiating to the shoulder blade needles-like feeling.  He had no other associated symptoms.  Worse with position changes.  Better with ibuprofen symptoms had resolved at the time of exam.  He had been on Lipitor several years prior.  His blood pressure was elevated on arrival.  He was taking ibuprofen at home for ongoing back pain.   Patient was referred by physical/rehab medicine by Dr. 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  Patient denied any exertional chest pain.  He stated he had neck pain.  He recently had an electrocution-like shock and is in rehab.  He admitted to pain in the legs when he walked and at rest.  Also complained of some leg numbness mostly on the left side.  He denied any progressive anginal or exertional symptoms.  Stated he sometimes felt dizzy but no presyncopal or syncopal episodes.  He has chronic pain and has been prescribed Topamax, gabapentin, tizanidine.  Recently prescribed meclizine for vertigo-like symptoms.  He was referred by physical rehab medicine to undergo vestibular therapy.  Recent x-rays of lumbar spine showed evidence of mild degenerative changes of L4-L5.  Degenerative changes of both SI joints.  Mild changes of bilateral sacroiliitis could not  be excluded.  His blood pressure was elevated at last visit. Was started on Amlodipine 5 mg po daily.  Recent vascular arterial studies showed no evidence of PAD in lower extremities. He is here today for follow-up.  Vascular studies discussed with patient.  He complains of neck and lower back pain.  States he had 1 episode of feeling briefly short of breath at work but nothing since then.  He had no other associated symptoms such as dizziness, lightheadedness, or palpitations.  He denies any progressive anginal or exertional symptoms, orthostatic symptoms, palpitations or arrhythmias, PND, orthopnea, bleeding, DVT or PE-like symptoms, or lower extremity edema.  Advised him I would discuss the results of the LS-spine x-ray relayed to [place] by Dr. Ranell Patrick to see if further abdominal imaging would be warranted.  Patient denies any abdominal pain or buttock claudication.   Past Medical History:  Diagnosis Date  . Cancer [facility])    colon cancer  . High cholesterol     Past Surgical History:  Procedure Laterality Date  . COLON RESECTION    . HERNIA REPAIR      Current Outpatient Medications  Medication Sig Dispense Refill  . amLODipine (NORVASC) 5 MG tablet Take 1 tablet (5 mg total) by mouth daily. 90 tablet 1  . gabapentin (NEURONTIN) 800 MG tablet Take 1 tablet (800 mg total) by mouth 3 (three) times daily. 90 tablet 3  . loratadine (CLARITIN) 10 MG tablet Take 10 mg by mouth daily as needed for allergies.    Marland Kitchen meclizine (ANTIVERT) 12.5 MG tablet Take 1 tablet (  12.5 mg total) by mouth 3 (three) times daily as needed for dizziness. 90 tablet 3  . senna-docusate (SENOKOT-S) 8.6-50 MG tablet Take 1 tablet by mouth daily.    Marland Kitchen tiZANidine (ZANAFLEX) 4 MG tablet TAKE 1 TABLET (4 MG TOTAL) BY MOUTH EVERY 8 (EIGHT) HOURS AS NEEDED FOR MUSCLE SPASMS. 90 tablet 2  . topiramate (TOPAMAX) 25 MG tablet Take 1 tablet (25 mg total) by mouth 2 (two) times daily. 90 tablet 1   No current facility-administered  medications for this visit.   Allergies:  Patient has no known allergies.   Social History: The patient  reports that he has quit smoking. He has never used smokeless tobacco. He reports that he does not drink alcohol and does not use drugs.   Family History: The patient's family history includes Cancer in his father; Heart attack in his cousin; Hypertension in his brother, mother, and sister.   ROS:  Please see the history of present illness. Otherwise, complete review of systems is positive for none.  All other systems are reviewed and negative.   Physical Exam: VS:  BP 138/82   Pulse 83   Ht 6' (1.829 m)   Wt 207 lb (93.9 kg)   SpO2 98%   BMI 28.07 kg/m , BMI Body mass index is 28.07 kg/m.  Wt Readings from Last 3 Encounters:  04/24/20 207 lb (93.9 kg)  03/18/20 214 lb (97.1 kg)  03/17/20 213 lb 9.6 oz (96.9 kg)    General: Patient appears comfortable at rest. Neck: Supple, no elevated JVP or carotid bruits, no thyromegaly. Lungs: Clear to auscultation, nonlabored breathing at rest. Cardiac: Regular rate and rhythm, no S3 or significant systolic murmur, no pericardial rub. Extremities: No pitting edema, distal pulses 2+. Skin: Warm and dry. Musculoskeletal: No kyphosis. Neuropsychiatric: Alert and oriented x3, affect grossly appropriate.  ECG:    Recent Labwork: 09/24/2019: BUN 10; Creatinine, Ser 1.37; Hemoglobin 13.1; Platelets 270; Potassium 4.3; Sodium 141  No results found for: CHOL, TRIG, HDL, CHOLHDL, VLDL, LDLCALC, LDLDIRECT  Other Studies Reviewed Today:  Vascular US lower ext. Art. Segmentals 04/22/2020  Summary: Right: Resting right ankle-brachial index is within normal range. No evidence of significant right lower extremity arterial disease. The right toe-brachial index is normal. Left: Resting left ankle-brachial index is within normal range. No evidence of significant left lower extremity arterial disease. The left toe-brachial index is  normal.  Assessment and Plan:   1. Chest pain of uncertain etiology Patient denies any recent anginal or exertional symptoms.  2. Elevated blood pressure reading Blood pressure has improved since starting amlodipine 5 mg daily.  Blood pressure today on arrival of 138/82 which is an improvement from previous blood pressure readings.  Advised patient to check his blood pressure at home.  Advised him the goal is less than 130/80.  Continue amlodipine 5 mg p.o. daily  3. 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 Vascular US with ABI ordered to LE which showed no evidence of PAD.  He denies any further claudication-like pain.  4. 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  We will discuss results with Dr. Harl Bowie to decide if further imaging is warranted.  Patient is asymptomatic.  Medication Adjustments/Labs and Tests Ordered: Current medicines are reviewed at length with the patient today.  Concerns regarding medicines are outlined above.   Disposition: Follow-up with Dr. Harl Bowie or APP 6 months  Signed, Levell July, NP 04/24/2020 10:30 AM    [facility] [place], [place], [place] [address] Phone: [phone]; Fax: [phone]

## 2020-04-24 ENCOUNTER — Other Ambulatory Visit: Payer: Self-pay

## 2020-04-24 ENCOUNTER — Encounter: Payer: Self-pay | Admitting: Family Medicine

## 2020-04-24 ENCOUNTER — Ambulatory Visit (INDEPENDENT_AMBULATORY_CARE_PROVIDER_SITE_OTHER): Payer: BC Managed Care – PPO | Admitting: Family Medicine

## 2020-04-24 VITALS — BP 138/82 | HR 83 | Ht 72.0 in | Wt 207.0 lb

## 2020-04-24 DIAGNOSIS — I739 Peripheral vascular disease, unspecified: Secondary | ICD-10-CM

## 2020-04-24 DIAGNOSIS — R030 Elevated blood-pressure reading, without diagnosis of hypertension: Secondary | ICD-10-CM

## 2020-04-24 DIAGNOSIS — R079 Chest pain, unspecified: Secondary | ICD-10-CM | POA: Diagnosis not present

## 2020-04-24 DIAGNOSIS — R937 Abnormal findings on diagnostic imaging of other parts of musculoskeletal system: Secondary | ICD-10-CM

## 2020-04-24 NOTE — Patient Instructions (Signed)

## 2020-05-12 ENCOUNTER — Encounter: Payer: Worker's Compensation | Admitting: Physical Medicine and Rehabilitation

## 2020-06-17 ENCOUNTER — Other Ambulatory Visit: Payer: Self-pay

## 2020-06-17 ENCOUNTER — Encounter
Payer: Worker's Compensation | Attending: Physical Medicine and Rehabilitation | Admitting: Physical Medicine and Rehabilitation

## 2020-06-17 ENCOUNTER — Encounter: Payer: Self-pay | Admitting: Physical Medicine and Rehabilitation

## 2020-06-17 VITALS — BP 139/87 | HR 62 | Temp 98.7°F | Ht 72.0 in | Wt 211.0 lb

## 2020-06-17 DIAGNOSIS — E7800 Pure hypercholesterolemia, unspecified: Secondary | ICD-10-CM | POA: Diagnosis not present

## 2020-06-17 DIAGNOSIS — M47816 Spondylosis without myelopathy or radiculopathy, lumbar region: Secondary | ICD-10-CM | POA: Diagnosis not present

## 2020-06-17 DIAGNOSIS — M7918 Myalgia, other site: Secondary | ICD-10-CM | POA: Insufficient documentation

## 2020-06-17 DIAGNOSIS — G629 Polyneuropathy, unspecified: Secondary | ICD-10-CM | POA: Insufficient documentation

## 2020-06-17 DIAGNOSIS — I700 Atherosclerosis of aorta: Secondary | ICD-10-CM | POA: Insufficient documentation

## 2020-06-17 DIAGNOSIS — R531 Weakness: Secondary | ICD-10-CM | POA: Diagnosis not present

## 2020-06-17 DIAGNOSIS — M47818 Spondylosis without myelopathy or radiculopathy, sacral and sacrococcygeal region: Secondary | ICD-10-CM | POA: Diagnosis not present

## 2020-06-17 DIAGNOSIS — M5442 Lumbago with sciatica, left side: Secondary | ICD-10-CM | POA: Diagnosis not present

## 2020-06-17 DIAGNOSIS — R202 Paresthesia of skin: Secondary | ICD-10-CM | POA: Diagnosis not present

## 2020-06-17 DIAGNOSIS — G44209 Tension-type headache, unspecified, not intractable: Secondary | ICD-10-CM | POA: Insufficient documentation

## 2020-06-17 DIAGNOSIS — R29898 Other symptoms and signs involving the musculoskeletal system: Secondary | ICD-10-CM | POA: Diagnosis not present

## 2020-06-17 DIAGNOSIS — G5712 Meralgia paresthetica, left lower limb: Secondary | ICD-10-CM | POA: Diagnosis not present

## 2020-06-17 DIAGNOSIS — Z85038 Personal history of other malignant neoplasm of large intestine: Secondary | ICD-10-CM | POA: Insufficient documentation

## 2020-06-17 DIAGNOSIS — Z8249 Family history of ischemic heart disease and other diseases of the circulatory system: Secondary | ICD-10-CM | POA: Insufficient documentation

## 2020-06-17 DIAGNOSIS — M545 Low back pain: Secondary | ICD-10-CM | POA: Diagnosis present

## 2020-06-17 DIAGNOSIS — R42 Dizziness and giddiness: Secondary | ICD-10-CM | POA: Diagnosis not present

## 2020-06-17 DIAGNOSIS — M4802 Spinal stenosis, cervical region: Secondary | ICD-10-CM

## 2020-06-17 DIAGNOSIS — G8929 Other chronic pain: Secondary | ICD-10-CM

## 2020-06-17 NOTE — Progress Notes (Signed)
Subjective:    Patient ID: [name], male    DOB: 02/23/1966, 54 y.o.   MRN: [id number]  HPI  [name] is a 54 year old man who presents for follow-up of neck and back pain following electrocution injury a work.  His pain is stable. He continues Gabapentin 800mg  TID, Tizanidine 4mg  TID PRN, and Topamax 25mg  BID. He does not want wean of the medications at this time.   He gets headaches once per day. These are located in his occipital lobe. He continues to take the Topamax 25mg  BID.   He continues to have vertigo, for which he takes meclizine as needed during the day.  Does not require any refills today.  Pain Inventory Average Pain 3 Pain Right Now 2 My pain is sharp, dull, tingling and aching  In the last 24 hours, has pain interfered with the following? General activity 3 Relation with others 1 Enjoyment of life 1 What TIME of day is your pain at its worst? evening and night Sleep (in general) Poor  Pain is worse with: walking, bending and standing Pain improves with: medication Relief from Meds: 2  Family History  Problem Relation Age of Onset  . Hypertension Mother   . Hypertension Sister   . Hypertension Brother   . Heart attack Cousin   . Cancer Father    Social History   Socioeconomic History  . Marital status: Single    Spouse name: Not on file  . Number of children: Not on file  . Years of education: Not on file  . Highest education level: Not on file  Occupational History  . Not on file  Tobacco Use  . Smoking status: Former Research scientist (life sciences)  . Smokeless tobacco: Never Used  Vaping Use  . Vaping Use: Never used  Substance and Sexual Activity  . Alcohol use: No  . Drug use: No  . Sexual activity: Not on file  Other Topics Concern  . Not on file  Social History Narrative  . Not on file   Social Determinants of Health   Financial Resource Strain:   . Difficulty of Paying Living Expenses:   Food Insecurity:   . Worried About Sales executive in the Last Year:   . Arboriculturist in the Last Year:   Transportation Needs:   . Film/video editor (Medical):   Marland Kitchen Lack of Transportation (Non-Medical):   Physical Activity:   . Days of Exercise per Week:   . Minutes of Exercise per Session:   Stress:   . Feeling of Stress :   Social Connections:   . Frequency of Communication with Friends and Family:   . Frequency of Social Gatherings with Friends and Family:   . Attends Religious Services:   . Active Member of Clubs or Organizations:   . Attends Archivist Meetings:   Marland Kitchen Marital Status:    Past Surgical History:  Procedure Laterality Date  . COLON RESECTION    . HERNIA REPAIR     Past Surgical History:  Procedure Laterality Date  . COLON RESECTION    . HERNIA REPAIR     Past Medical History:  Diagnosis Date  . Cancer [facility])    colon cancer  . High cholesterol    BP 139/87   Pulse 62   Temp 98.7 F (37.1 C)   Ht 6' (1.829 m)   Wt 211 lb (95.7 kg)   SpO2 96%   BMI 28.62 kg/m  Opioid Risk Score:   Fall Risk Score:  `1  Depression screen PHQ 2/9  Depression screen [facility] 2/9 03/17/2020 02/14/2020 12/18/2019 09/24/2019  Decreased Interest 1 1 0 2  Down, Depressed, Hopeless 1 1 0 2  PHQ - 2 Score 2 2 0 4  Altered sleeping - - - 3  Tired, decreased energy - - - 2  Change in appetite - - - 1  Feeling bad or failure about yourself  - - - 0  Trouble concentrating - - - 1  Moving slowly or fidgety/restless - - - 2  Suicidal thoughts - - - 0  PHQ-9 Score - - - 13  Difficult doing work/chores - - - Somewhat difficult   Review of Systems  Constitutional: Negative.   HENT: Negative.   Eyes: Negative.   Respiratory: Negative.   Gastrointestinal: Negative.   Endocrine: Negative.   Genitourinary: Negative.   Musculoskeletal: Positive for myalgias, neck pain and neck stiffness.  Skin: Negative.   Allergic/Immunologic: Negative.   Neurological: Positive for numbness and headaches.       Tingling   Hematological: Negative.   Psychiatric/Behavioral: Negative.   All other systems reviewed and are negative.      Objective:   Physical Exam Gen: no distress, normal appearing HEENT: oral mucosa pink and moist, NCAT Cardio: Reg rate Chest: normal effort, normal rate of breathing Abd: soft, non-distended Ext: no edema Skin: intact Neuro:AOx3.Decreased sensation in bilateral arms and left leg. Musculoskeletal:5/5 strength throughout with the exception of right leg which has 4/5 strength.Good muscle bulk.Antalgic gait with dragging of right leg.Decreased sensation over left anterior thigh. Tender to palpation in neck muscles.  Psych: pleasant, normal affect    Assessment & Plan:  [name] presents for follow-upafterelectrocution injury at work which has resulted in neck and lower back pain, parasthesias, and weakness.   Paresthesias/neuropathic pain --Gabapentin: continue 800mg  TID. Advised regarding side effects and expected scheduling; will be reversed for patient since he works at night. Does not require refill today.   Vertigo --Continuing to experience regular symptoms. Provided referral for vestibular therapy. Does not require refill of Meclizine.   Cervical myofascial pain syndrome -Continue Tizanidine as needed. Discussed trigger point injections but patient defers at this time.  -ContinueHEP of stretching and strengthening of the muscles of neck and upper back, which he is doing regularly.  -Advised that he can slowly return to former activities starting with walking, then weightlifitng at the gym, and then basketball. He has still not yet returned to these activities due to his pain.   Discussed results of  XR of lumbar spine, which shows evidence of the following:  1. Mild degenerative changes L4-L5. No acute abnormality identified. No evidence of fracture.  2. Degenerative changes both SI joints. Mild changes of bilateral sacroiliitis cannot be  excluded.  3. 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  Provided referral to cardiology given evidence of vascular disease.   Headaches, tension type: Does not require refill of Topiramate; continue TID.  4) Occipital neuralgia: Discussed occipital nerve block if pain worsens in future.   RTC in35months to assess progress with above interventions.

## 2020-08-07 ENCOUNTER — Telehealth: Payer: Self-pay | Admitting: *Deleted

## 2020-08-07 NOTE — Telephone Encounter (Signed)
[name] called because he wants to know what he can do because his left arm and leg have been going numb, his leg "all the way up to his groin".  Please advise.

## 2020-08-10 ENCOUNTER — Other Ambulatory Visit: Payer: Self-pay | Admitting: Physical Medicine and Rehabilitation

## 2020-08-10 DIAGNOSIS — M4802 Spinal stenosis, cervical region: Secondary | ICD-10-CM

## 2020-08-10 DIAGNOSIS — M47816 Spondylosis without myelopathy or radiculopathy, lumbar region: Secondary | ICD-10-CM

## 2020-08-10 NOTE — Telephone Encounter (Signed)
Called patient over the weekend with no response, trying to call back now

## 2020-08-19 ENCOUNTER — Encounter: Payer: Self-pay | Admitting: Physical Medicine and Rehabilitation

## 2020-08-19 ENCOUNTER — Encounter
Payer: BC Managed Care – PPO | Attending: Physical Medicine and Rehabilitation | Admitting: Physical Medicine and Rehabilitation

## 2020-08-19 ENCOUNTER — Other Ambulatory Visit: Payer: Self-pay

## 2020-08-19 VITALS — BP 152/82 | HR 59 | Temp 98.4°F | Ht 72.0 in | Wt 212.8 lb

## 2020-08-19 DIAGNOSIS — M4802 Spinal stenosis, cervical region: Secondary | ICD-10-CM | POA: Diagnosis present

## 2020-08-19 DIAGNOSIS — R29898 Other symptoms and signs involving the musculoskeletal system: Secondary | ICD-10-CM | POA: Diagnosis not present

## 2020-08-19 MED ORDER — GABAPENTIN 600 MG PO TABS: 600.0000 mg | ORAL_TABLET | Freq: Three times a day (TID) | ORAL | 0 refills | Status: DC

## 2020-08-19 NOTE — Progress Notes (Signed)
Subjective:    Patient ID: [name], male    DOB: [birth year], 54 y.o.   MRN: [id number]  HPI  Tolerating work well.  Average pain is about 3 right now.  Still gets the shooting pain down his arms and legs. This mostly happens when walking at work. Does not weakness that worsens at time- usually worst in left leg.  Still walks with limp.  He went to try to get XR done but was told he needs a card.  Does not know if Gabapentin is helping.    Pain Inventory Average Pain 3 Pain Right Now 3 My pain is dull, tingling and aching  In the last 24 hours, has pain interfered with the following? General activity 3 Relation with others 0 Enjoyment of life 0 What TIME of day is your pain at its worst? night Sleep (in general) Poor  Pain is worse with: walking, bending and standing Pain improves with: medication Relief from Meds: 3  Family History  Problem Relation Age of Onset  . Hypertension Mother   . Hypertension Sister   . Hypertension Brother   . Heart attack Cousin   . Cancer Father    Social History   Socioeconomic History  . Marital status: Single    Spouse name: Not on file  . Number of children: Not on file  . Years of education: Not on file  . Highest education level: Not on file  Occupational History  . Not on file  Tobacco Use  . Smoking status: Former Research scientist (life sciences)  . Smokeless tobacco: Never Used  Vaping Use  . Vaping Use: Never used  Substance and Sexual Activity  . Alcohol use: No  . Drug use: No  . Sexual activity: Not on file  Other Topics Concern  . Not on file  Social History Narrative  . Not on file   Social Determinants of Health   Financial Resource Strain:   . Difficulty of Paying Living Expenses: Not on file  Food Insecurity:   . Worried About Charity fundraiser in the Last Year: Not on file  . Ran Out of Food in the Last Year: Not on file  Transportation Needs:   . Lack of Transportation (Medical): Not on file  . Lack of  Transportation (Non-Medical): Not on file  Physical Activity:   . Days of Exercise per Week: Not on file  . Minutes of Exercise per Session: Not on file  Stress:   . Feeling of Stress : Not on file  Social Connections:   . Frequency of Communication with Friends and Family: Not on file  . Frequency of Social Gatherings with Friends and Family: Not on file  . Attends Religious Services: Not on file  . Active Member of Clubs or Organizations: Not on file  . Attends Archivist Meetings: Not on file  . Marital Status: Not on file   Past Surgical History:  Procedure Laterality Date  . COLON RESECTION    . HERNIA REPAIR     Past Surgical History:  Procedure Laterality Date  . COLON RESECTION    . HERNIA REPAIR     Past Medical History:  Diagnosis Date  . Cancer [facility])    colon cancer  . High cholesterol    BP (!) 152/82   Pulse (!) 59   Temp 98.4 F (36.9 C)   Ht 6' (1.829 m)   Wt 212 lb 12.8 oz (96.5 kg)   SpO2 95%  BMI 28.86 kg/m   Opioid Risk Score:   Fall Risk Score:  `1  Depression screen PHQ 2/9  Depression screen [facility] 2/9 03/17/2020 02/14/2020 12/18/2019 09/24/2019  Decreased Interest 1 1 0 2  Down, Depressed, Hopeless 1 1 0 2  PHQ - 2 Score 2 2 0 4  Altered sleeping - - - 3  Tired, decreased energy - - - 2  Change in appetite - - - 1  Feeling bad or failure about yourself  - - - 0  Trouble concentrating - - - 1  Moving slowly or fidgety/restless - - - 2  Suicidal thoughts - - - 0  PHQ-9 Score - - - 13  Difficult doing work/chores - - - Somewhat difficult    Review of Systems  Musculoskeletal: Positive for myalgias, neck pain and neck stiffness.  Neurological: Positive for headaches.  All other systems reviewed and are negative.      Objective:   Physical Exam Gen: no distress, normal appearing HEENT: oral mucosa pink and moist, NCAT Cardio: Reg rate Chest: normal effort, normal rate of breathing Abd: soft, non-distended Ext: no  edema Skin: intact Neuro: Alert and oriented x3. 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  -Pain has improved to 3/10 on average. Wean Gabapentin to 6006/600mg /800mg .   2) Weakness, parasthesias secondary to cervical stenosis vs. Electrical injury -Had flare last week that has since improved -Check cervical spine XR to ensure that stenosis has not worsened -Continue to work and exercise as tolerated to maintain muscle strength.   RTC in 1 month to assess progress with the above interventions. I will call to discuss XR results once available

## 2020-08-31 ENCOUNTER — Encounter: Payer: Self-pay | Admitting: Physical Medicine and Rehabilitation

## 2020-08-31 ENCOUNTER — Other Ambulatory Visit: Payer: Self-pay

## 2020-08-31 ENCOUNTER — Ambulatory Visit (HOSPITAL_COMMUNITY)
Admission: RE | Admit: 2020-08-31 | Discharge: 2020-08-31 | Disposition: A | Discharge status: Home / Self Care | Payer: BC Managed Care – PPO | Source: Ambulatory Visit | Attending: Physical Medicine and Rehabilitation | Admitting: Physical Medicine and Rehabilitation

## 2020-08-31 DIAGNOSIS — M4802 Spinal stenosis, cervical region: Secondary | ICD-10-CM | POA: Insufficient documentation

## 2020-09-16 ENCOUNTER — Encounter: Payer: Self-pay | Admitting: Physical Medicine and Rehabilitation

## 2020-09-16 ENCOUNTER — Encounter
Payer: Worker's Compensation | Attending: Physical Medicine and Rehabilitation | Admitting: Physical Medicine and Rehabilitation

## 2020-09-16 ENCOUNTER — Other Ambulatory Visit: Payer: Self-pay

## 2020-09-16 VITALS — BP 136/83 | HR 65 | Temp 98.7°F | Ht 72.0 in | Wt 209.0 lb

## 2020-09-16 DIAGNOSIS — M48062 Spinal stenosis, lumbar region with neurogenic claudication: Secondary | ICD-10-CM | POA: Insufficient documentation

## 2020-09-16 DIAGNOSIS — M7918 Myalgia, other site: Secondary | ICD-10-CM | POA: Diagnosis not present

## 2020-09-16 NOTE — Progress Notes (Signed)
Subjective:    Patient ID: [name], male    DOB: 04-Sep-1966, 54 y.o.   MRN: [id number]  HPI  Pain has worsened.  He eats chicken, steak, pork chops, salad. He eats fruits every day. Decrease foods with added sugars.   Still taking Gabapentin- he feels that he has gotten used to this.   Work has not accommodated him.  He continues to get electrical sensations in his neck.   Denies: Average pain is about 3 right now.  Still gets the shooting pain down his arms and legs. This mostly happens when walking at work. Does not weakness that worsens at time- usually worst in left leg.  Still walks with limp.  He went to try to get XR done but was told he needs a card.  Does not know if Gabapentin is helping.    Pain Inventory Average Pain 6 Pain Right Now 5 My pain is sharp, dull and aching  In the last 24 hours, has pain interfered with the following? General activity 4 Relation with others 2 Enjoyment of life 4 What TIME of day is your pain at its worst? morning , evening and night Sleep (in general) Poor  Pain is worse with: walking, bending and standing Pain improves with: therapy/exercise and medication Relief from Meds: 1  Family History  Problem Relation Age of Onset  . Hypertension Mother   . Hypertension Sister   . Hypertension Brother   . Heart attack Cousin   . Cancer Father    Social History   Socioeconomic History  . Marital status: Single    Spouse name: Not on file  . Number of children: Not on file  . Years of education: Not on file  . Highest education level: Not on file  Occupational History  . Not on file  Tobacco Use  . Smoking status: Former Research scientist (life sciences)  . Smokeless tobacco: Never Used  Vaping Use  . Vaping Use: Never used  Substance and Sexual Activity  . Alcohol use: No  . Drug use: No  . Sexual activity: Not on file  Other Topics Concern  . Not on file  Social History Narrative  . Not on file   Social Determinants of Health    Financial Resource Strain:   . Difficulty of Paying Living Expenses: Not on file  Food Insecurity:   . Worried About Charity fundraiser in the Last Year: Not on file  . Ran Out of Food in the Last Year: Not on file  Transportation Needs:   . Lack of Transportation (Medical): Not on file  . Lack of Transportation (Non-Medical): Not on file  Physical Activity:   . Days of Exercise per Week: Not on file  . Minutes of Exercise per Session: Not on file  Stress:   . Feeling of Stress : Not on file  Social Connections:   . Frequency of Communication with Friends and Family: Not on file  . Frequency of Social Gatherings with Friends and Family: Not on file  . Attends Religious Services: Not on file  . Active Member of Clubs or Organizations: Not on file  . Attends Archivist Meetings: Not on file  . Marital Status: Not on file   Past Surgical History:  Procedure Laterality Date  . COLON RESECTION    . HERNIA REPAIR     Past Surgical History:  Procedure Laterality Date  . COLON RESECTION    . HERNIA REPAIR     Past  Medical History:  Diagnosis Date  . Cancer [facility])    colon cancer  . High cholesterol    There were no vitals taken for this visit.  Opioid Risk Score:   Fall Risk Score:  `1  Depression screen PHQ 2/9  Depression screen [facility] 2/9 03/17/2020 02/14/2020 12/18/2019 09/24/2019  Decreased Interest 1 1 0 2  Down, Depressed, Hopeless 1 1 0 2  PHQ - 2 Score 2 2 0 4  Altered sleeping - - - 3  Tired, decreased energy - - - 2  Change in appetite - - - 1  Feeling bad or failure about yourself  - - - 0  Trouble concentrating - - - 1  Moving slowly or fidgety/restless - - - 2  Suicidal thoughts - - - 0  PHQ-9 Score - - - 13  Difficult doing work/chores - - - Somewhat difficult    Review of Systems  Constitutional: Negative.   HENT: Negative.   Eyes: Negative.   Respiratory: Negative.   Cardiovascular: Negative.   Gastrointestinal: Negative.    Genitourinary: Negative.   Musculoskeletal: Positive for back pain, myalgias, neck pain and neck stiffness.  Skin: Negative.   Neurological: Positive for weakness, numbness and headaches.       Tingling  Psychiatric/Behavioral: Negative.   All other systems reviewed and are negative.      Objective:   Physical Exam Gen: no distress, normal appearing HEENT: oral mucosa pink and moist, NCAT Cardio: Reg rate Chest: normal effort, normal rate of breathing Abd: soft, non-distended Ext: no edema Skin: intact Neuro: Alert and oriented x3. 5/5 strength throughout except for 4+/5 in left leg Musculoskeletal: Tight cervical and thoracic paraspinal muscles Psych: pleasant, normal affect      Assessment & Plan:  1) Chronic Pain Syndrome secondary to cervical stenosis, myofascial syndrome -Discussed current symptoms of pain and history of pain.  -Continue Gabapentin to 6006/600mg /800mg .  -Recommended hot shower, kpad for myofascial pain and trigger point injections next visit.  -Discussed current symptoms of pain and history of pain.  -Discussed benefits of exercise in reducing pain. -Discussed Turmeric to reduce inflammation--can be used in cooking or taken as a supplement.  Benefits of turmeric:  -Highly anti-inflammatory  -Increases antioxidants  -Improves memory, attention, brain disease  -Lowers risk of heart disease  -May help prevent cancer  -Decreases pain  -Alleviates depression  -Delays aging and decreases risk of chronic disease  -Consume with black pepper to increase absorption    Turmeric Milk Recipe:  1 cup milk  1 tsp turmeric  1 tsp cinnamon  1 tsp grated ginger (optional)  Black pepper (boosts the anti-inflammatory properties of turmeric).  1 tsp honey   2) Weakness, parasthesias secondary to cervical stenosis vs. Electrical injury -Having repeat flare -Continue to work and exercise as tolerated to maintain muscle strength, but work has  not been providing him accommodations.   3) Lumbar spinal stenosis/facet arthropathy: -reviewed XR results with patient and ordered Lumbar MRI. Will call him to discuss results and assess if he is candidate for medial branch block or ESI.   RTC in 1 month to assess progress with the above interventions. I will call to discuss XR results once available

## 2020-09-16 NOTE — Patient Instructions (Signed)
[email or web address]   Turmeric to reduce inflammation--can be used in cooking or taken as a supplement.  Benefits of turmeric:  -Highly anti-inflammatory  -Increases antioxidants  -Improves memory, attention, brain disease  -Lowers risk of heart disease  -May help prevent cancer  -Decreases pain  -Alleviates depression  -Delays aging and decreases risk of chronic disease  -Consume with black pepper to increase absorption    Turmeric Milk Recipe:  1 cup milk  1 tsp turmeric  1 tsp cinnamon  1 tsp grated ginger (optional)  Black pepper (boosts the anti-inflammatory properties of turmeric).  1 tsp honey

## 2020-11-02 ENCOUNTER — Encounter: Payer: Worker's Compensation | Admitting: Physical Medicine and Rehabilitation

## 2020-11-06 ENCOUNTER — Encounter
Payer: Worker's Compensation | Attending: Physical Medicine and Rehabilitation | Admitting: Physical Medicine and Rehabilitation

## 2020-11-06 ENCOUNTER — Encounter: Payer: Self-pay | Admitting: Physical Medicine and Rehabilitation

## 2020-11-06 ENCOUNTER — Other Ambulatory Visit: Payer: Self-pay

## 2020-11-06 VITALS — BP 147/71 | HR 64 | Temp 99.0°F | Ht 72.0 in | Wt 202.8 lb

## 2020-11-06 DIAGNOSIS — M4807 Spinal stenosis, lumbosacral region: Secondary | ICD-10-CM | POA: Diagnosis not present

## 2020-11-06 DIAGNOSIS — M7918 Myalgia, other site: Secondary | ICD-10-CM | POA: Diagnosis not present

## 2020-11-06 DIAGNOSIS — R29898 Other symptoms and signs involving the musculoskeletal system: Secondary | ICD-10-CM | POA: Insufficient documentation

## 2020-11-06 MED ORDER — MELOXICAM 7.5 MG PO TABS
7.5000 mg | ORAL_TABLET | Freq: Every day | ORAL | 1 refills | Status: DC | PRN
Start: 2020-11-06 — End: 2021-01-07

## 2020-11-06 NOTE — Patient Instructions (Signed)
.  Turmeric to reduce inflammation--can be used in cooking or taken as a supplement.  Benefits of turmeric:  -Highly anti-inflammatory  -Increases antioxidants  -Improves memory, attention, brain disease  -Lowers risk of heart disease  -May help prevent cancer  -Decreases pain  -Alleviates depression  -Delays aging and decreases risk of chronic disease  -Consume with black pepper to increase absorption    Turmeric Milk Recipe:  1 cup milk  1 tsp turmeric  1 tsp cinnamon  1 tsp grated ginger (optional)  Black pepper (boosts the anti-inflammatory properties of turmeric).  1 tsp honey 

## 2020-11-06 NOTE — Progress Notes (Signed)
Subjective:    Patient ID: [name], male    DOB: 1966-01-23, 55 y.o.   MRN: [id number]  HPI  Mr. Vanderwerf is a 54 year old man who presents for follow-up of his cervical and lumbar spine pain.  1) Cervical stenosis: His neck pain and radicular symptoms have worsened since last visit. He does not feel the Gabapentin is helping but he does not feel sleepy with it. He continues to have pain radiating down both arms with weakness in his arms.   2) Cervical myofascial pain syndrome: He has a lot of tightness in his neck and would be interested in trigger point injections next visit.   3) Diet: He eats chicken, steak, pork chops, salad. He eats fruits every day. Decrease foods with added sugars.   4) Lumbar stenosis: His back is hurting him worst currently, with pain radiating into both legs, worst on the left side.   Work has not accommodated him.  He continues to get electrical sensations in his neck.   Denies: Average pain is about 3 right now.  Still gets the shooting pain down his arms and legs. This mostly happens when walking at work. Does not weakness that worsens at time- usually worst in left leg.  Still walks with limp.  He went to try to get XR done but was told he needs a card.  Does not know if Gabapentin is helping.    Pain Inventory Average Pain 6 Pain Right Now 5 My pain is sharp, burning, dull, stabbing, tingling and aching  In the last 24 hours, has pain interfered with the following? General activity 4 Relation with others 4 Enjoyment of life 4 What TIME of day is your pain at its worst? morning , daytime, evening and night Sleep (in general) Poor  Pain is worse with: walking, bending and standing Pain improves with: other Relief from Meds: 1  Family History  Problem Relation Age of Onset  . Hypertension Mother   . Hypertension Sister   . Hypertension Brother   . Heart attack Cousin   . Cancer Father    Social History   Socioeconomic  History  . Marital status: Single    Spouse name: Not on file  . Number of children: Not on file  . Years of education: Not on file  . Highest education level: Not on file  Occupational History  . Not on file  Tobacco Use  . Smoking status: Former Research scientist (life sciences)  . Smokeless tobacco: Never Used  Vaping Use  . Vaping Use: Never used  Substance and Sexual Activity  . Alcohol use: No  . Drug use: No  . Sexual activity: Not on file  Other Topics Concern  . Not on file  Social History Narrative  . Not on file   Social Determinants of Health   Financial Resource Strain: Not on file  Food Insecurity: Not on file  Transportation Needs: Not on file  Physical Activity: Not on file  Stress: Not on file  Social Connections: Not on file   Past Surgical History:  Procedure Laterality Date  . COLON RESECTION    . HERNIA REPAIR     Past Surgical History:  Procedure Laterality Date  . COLON RESECTION    . HERNIA REPAIR     Past Medical History:  Diagnosis Date  . Cancer [facility])    colon cancer  . High cholesterol    There were no vitals taken for this visit.  Opioid Risk Score:  Fall Risk Score:  `1  Depression screen PHQ 2/9  Depression screen [facility] 2/9 03/17/2020 02/14/2020 12/18/2019 09/24/2019  Decreased Interest 1 1 0 2  Down, Depressed, Hopeless 1 1 0 2  PHQ - 2 Score 2 2 0 4  Altered sleeping - - - 3  Tired, decreased energy - - - 2  Change in appetite - - - 1  Feeling bad or failure about yourself  - - - 0  Trouble concentrating - - - 1  Moving slowly or fidgety/restless - - - 2  Suicidal thoughts - - - 0  PHQ-9 Score - - - 13  Difficult doing work/chores - - - Somewhat difficult    Review of Systems  Constitutional: Negative.   HENT: Negative.   Eyes: Negative.   Respiratory: Negative.   Cardiovascular: Negative.   Gastrointestinal: Negative.   Genitourinary: Negative.   Musculoskeletal: Positive for back pain, myalgias, neck pain and neck stiffness.  Skin:  Negative.   Neurological: Positive for weakness, numbness and headaches.       Tingling  Psychiatric/Behavioral: Negative.   All other systems reviewed and are negative.      Objective:   Physical Exam Gen: no distress, normal appearing HEENT: oral mucosa pink and moist, NCAT Cardio: Reg rate Chest: normal effort, normal rate of breathing Abd: soft, non-distended Ext: no edema Skin: intact Neuro: Alert and oriented x3. 4/5 in left leg KE and 3/5 DF. 4/5 bilateral triceps extension and hand grip- worse from last visit. Antalgic gait Musculoskeletal: Tight cervical and thoracic paraspinal muscles Psych: pleasant, normal affect      Assessment & Plan:  1) Chronic Pain Syndrome secondary to cervical stenosis, myofascial syndrome -Discussed current symptoms of pain and history of pain.  -Continue Gabapentin 800mg  TID -Recommended hot shower, kpad for myofascial pain and trigger point injections next visit.  -Discussed current symptoms of pain and history of pain.  -Discussed benefits of exercise in reducing pain. -Trigger point injections next visit -Repeat cervical MRI given worsening neurological symptoms. -Prescribed Meloxicam 7.5mg  daily prn Turmeric to reduce inflammation--can be used in cooking or taken as a supplement.  Benefits of turmeric:  -Highly anti-inflammatory  -Increases antioxidants  -Improves memory, attention, brain disease  -Lowers risk of heart disease  -May help prevent cancer  -Decreases pain  -Alleviates depression  -Delays aging and decreases risk of chronic disease  -Consume with black pepper to increase absorption    Turmeric Milk Recipe:  1 cup milk  1 tsp turmeric  1 tsp cinnamon  1 tsp grated ginger (optional)  Black pepper (boosts the anti-inflammatory properties of turmeric).  1 tsp honey  2) Weakness, parasthesias secondary to cervical stenosis vs. Electrical injury -Having repeat flare -Continue to work and exercise  as tolerated to maintain muscle strength, but work has not been providing him accommodations.   3) Lumbar spinal stenosis/facet arthropathy: -Given worsening neurological symptoms, I have ordered a repeat lumbar MRI. Can consider ESI based on results.   RTC in 1 month to assess progress with the above interventions. I will call to discuss XR results once available

## 2020-11-08 NOTE — Progress Notes (Signed)
Cardiology Office Note  Date: 11/09/2020   ID: [name], [name], MRN [id number]  PCP:  Ignatius Specking, MD  Cardiologist:  Dina Rich, MD Electrophysiologist:  None   Chief Complaint: Follow-up chest pain  History of Present Illness: [name] is a 55 y.o. male with a history of chest pain, hyperlipidemia, former smoker, elevated blood pressure.  Last seen by Dr. Wyline Mood on 12/21/2017 as a new consult by referral from PCP.  He was seen in emergency room November 27, 2017 with atypical chest pain.  His EKG showed LVH, strain pattern, troponins negative x2.  Chest x-ray small right effusion, left-sided pressure-like pain radiating to the shoulder blade needles-like feeling.  He had no other associated symptoms.  Worse with position changes.  Better with ibuprofen symptoms had resolved at the time of exam.  He had been on Lipitor several years prior.  His blood pressure was elevated on arrival.  He was taking ibuprofen at home for ongoing back pain.   Patient was referred by physical/rehab medicine by Dr. 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  Patient denied any exertional chest pain.  He stated he had neck pain.  He recently had an electrocution-like shock and is in rehab.  He admitted to pain in the legs when he walked and at rest.  Also complained of some leg numbness mostly on the left side.  He denied any progressive anginal or exertional symptoms.  Stated he sometimes felt dizzy but no presyncopal or syncopal episodes.  He has chronic pain and has been prescribed Topamax, gabapentin, tizanidine.  Recently prescribed meclizine for vertigo-like symptoms.  He was referred by physical rehab medicine to undergo vestibular therapy.  Recent x-rays of lumbar spine showed evidence of mild degenerative changes of L4-L5.  Degenerative changes of both SI joints.  Mild changes of bilateral sacroiliitis could not  be excluded.  His blood pressure was elevated at last visit. Was started on Amlodipine 5 mg po daily.  Recent vascular arterial studies showed no evidence of PAD in lower extremities. He is here today for follow-up.  Vascular studies discussed with patient.  He complains of neck and lower back pain.  States he had 1 episode of feeling briefly short of breath at work but nothing since then.  He had no other associated symptoms such as dizziness, lightheadedness, or palpitations.  He denies any progressive anginal or exertional symptoms, orthostatic symptoms, palpitations or arrhythmias, PND, orthopnea, bleeding, DVT or PE-like symptoms, or lower extremity edema.  Advised him I would discuss the results of the LS-spine x-ray relayed to [place] by Dr. Carlis Abbott to see if further abdominal imaging would be warranted.  Patient denies any abdominal pain or buttock claudication.  He is here for follow-up.  He denies any recent issues other than some right-sided chest discomfort which was aggravated by movement.  States this lasted for about a week.  He does a lot of heavy pushing and lifting at work.  He denied any radiation or associated symptoms when this occurred.  Denies any further anginal or exertional symptoms after that particular week.  Denies any palpitations or arrhythmias, orthostatic symptoms other than occasional transient dizziness which is short-lived.  No near syncopal or syncopal episodes.  No PND, orthopnea.  No bleeding.  No claudication-like symptoms, DVT or PE-like symptoms.  Blood pressure is well controlled today.  EKG sinus rhythm with premature atrial complexes rate of 61, LVH with  repolarization abnormality    Past Medical History:  Diagnosis Date  . Cancer [facility])    colon cancer  . High cholesterol     Past Surgical History:  Procedure Laterality Date  . COLON RESECTION    . HERNIA REPAIR      Current Outpatient Medications  Medication Sig Dispense Refill  . amLODipine (NORVASC) 5  MG tablet Take 1 tablet (5 mg total) by mouth daily. 90 tablet 1  . gabapentin (NEURONTIN) 800 MG tablet Take 800 mg by mouth 2 (two) times daily.    Marland Kitchen loratadine (CLARITIN) 10 MG tablet Take 10 mg by mouth daily as needed for allergies.    Marland Kitchen meclizine (ANTIVERT) 12.5 MG tablet Take 1 tablet (12.5 mg total) by mouth 3 (three) times daily as needed for dizziness. 90 tablet 3  . meloxicam (MOBIC) 7.5 MG tablet Take 1 tablet (7.5 mg total) by mouth daily as needed for pain. 30 tablet 1  . senna-docusate (SENOKOT-S) 8.6-50 MG tablet Take 1 tablet by mouth daily.    Marland Kitchen tiZANidine (ZANAFLEX) 4 MG tablet TAKE 1 TABLET (4 MG TOTAL) BY MOUTH EVERY 8 (EIGHT) HOURS AS NEEDED FOR MUSCLE SPASMS. 90 tablet 2  . topiramate (TOPAMAX) 25 MG tablet Take 1 tablet (25 mg total) by mouth 2 (two) times daily. 90 tablet 1   No current facility-administered medications for this visit.   Allergies:  Patient has no known allergies.   Social History: The patient  reports that he has quit smoking. He has never used smokeless tobacco. He reports that he does not drink alcohol and does not use drugs.   Family History: The patient's family history includes Cancer in his father; Heart attack in his cousin; Hypertension in his brother, mother, and sister.   ROS:  Please see the history of present illness. Otherwise, complete review of systems is positive for none.  All other systems are reviewed and negative.   Physical Exam: VS:  BP 120/80   Pulse (!) 59   Ht 6' (1.829 m)   Wt 205 lb 3.2 oz (93.1 kg)   SpO2 96%   BMI 27.83 kg/m , BMI Body mass index is 27.83 kg/m.  Wt Readings from Last 3 Encounters:  11/09/20 205 lb 3.2 oz (93.1 kg)  11/06/20 202 lb 12.8 oz (92 kg)  09/16/20 209 lb (94.8 kg)    General: Patient appears comfortable at rest. Neck: Supple, no elevated JVP or carotid bruits, no thyromegaly. Lungs: Clear to auscultation, nonlabored breathing at rest. Cardiac: Regular rate and rhythm, no S3 or  significant systolic murmur, no pericardial rub. Extremities: No pitting edema, distal pulses 2+. Skin: Warm and dry. Musculoskeletal: No kyphosis. Neuropsychiatric: Alert and oriented x3, affect grossly appropriate.  ECG: 11/09/2020 sinus rhythm with premature atrial complexes rate of 61, left ventricular hypertrophy with repolarization abnormality.  Recent Labwork: No results found for requested labs within last 8760 hours.  No results found for: CHOL, TRIG, HDL, CHOLHDL, VLDL, LDLCALC, LDLDIRECT  Other Studies Reviewed Today:  Vascular US lower ext. Art. Segmentals 04/22/2020 Summary: Right: Resting right ankle-brachial index is within normal range. No evidence of significant right lower extremity arterial disease. The right toe-brachial index is normal. Left: Resting left ankle-brachial index is within normal range. No evidence of significant left lower extremity arterial disease. The left toe-brachial index is normal.  Assessment and Plan:   1. Chest pain of uncertain etiology Patient denies any recent anginal or exertional symptoms.  EKG today sinus rhythm with  premature atrial complexes rate of 61, LVH with repolarization abnormality.  States he had a recent episode for approximately 1 week course right-sided chest discomfort without radiation or associated symptoms.  States he does a lot of heavy labor-intensive work pushing large amounts of weight and lifting.  States chest discomfort resolved after 1 week.  He states it was aggravated by movement..  2. Elevated blood pressure reading Blood pressure has improved since starting amlodipine 5 mg daily.  Blood pressure today is 120/80.  Continue amlodipine 5 mg daily. 3. 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 Vascular US with ABI ordered to LE which showed no evidence of PAD.  He denies any further  claudication-like pain.  4. 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   Patient is asymptomatic.  Medication Adjustments/Labs and Tests Ordered: Current medicines are reviewed at length with the patient today.  Concerns regarding medicines are outlined above.   Disposition: Follow-up with Dr. Wyline Mood or APP 6 months  Signed, Rennis Harding, NP 11/09/2020 9:21 AM    [facility] [address] [place], [place], [place] [address] Phone: [phone]; Fax: [phone]

## 2020-11-09 ENCOUNTER — Ambulatory Visit (INDEPENDENT_AMBULATORY_CARE_PROVIDER_SITE_OTHER): Payer: BC Managed Care – PPO | Admitting: Family Medicine

## 2020-11-09 ENCOUNTER — Encounter: Payer: Self-pay | Admitting: Family Medicine

## 2020-11-09 VITALS — BP 120/80 | HR 59 | Ht 72.0 in | Wt 205.2 lb

## 2020-11-09 DIAGNOSIS — R079 Chest pain, unspecified: Secondary | ICD-10-CM

## 2020-11-09 DIAGNOSIS — R030 Elevated blood-pressure reading, without diagnosis of hypertension: Secondary | ICD-10-CM | POA: Diagnosis not present

## 2020-11-09 DIAGNOSIS — R937 Abnormal findings on diagnostic imaging of other parts of musculoskeletal system: Secondary | ICD-10-CM

## 2020-11-09 DIAGNOSIS — I739 Peripheral vascular disease, unspecified: Secondary | ICD-10-CM | POA: Diagnosis not present

## 2020-11-09 NOTE — Patient Instructions (Signed)

## 2020-11-09 NOTE — Addendum Note (Signed)
Addended by: Laurine Blazer on: 11/09/2020 10:59 AM   Modules accepted: Orders

## 2020-11-24 ENCOUNTER — Telehealth: Payer: Self-pay | Admitting: *Deleted

## 2020-11-24 NOTE — Telephone Encounter (Signed)
[name] had his MRI.  He is asking if you will get the results or does he need to bring a disk. Please advise.

## 2020-11-25 NOTE — Telephone Encounter (Signed)
Can you please advise him that I will call him with the results when I am back in the clinic tomorrow? Thank you!

## 2020-12-23 ENCOUNTER — Encounter
Payer: Worker's Compensation | Attending: Physical Medicine and Rehabilitation | Admitting: Physical Medicine and Rehabilitation

## 2020-12-23 ENCOUNTER — Other Ambulatory Visit: Payer: Self-pay

## 2020-12-23 ENCOUNTER — Encounter: Payer: Self-pay | Admitting: Physical Medicine and Rehabilitation

## 2020-12-23 VITALS — BP 143/76 | HR 72 | Temp 98.9°F | Ht 72.0 in | Wt 195.0 lb

## 2020-12-23 DIAGNOSIS — M7918 Myalgia, other site: Secondary | ICD-10-CM | POA: Insufficient documentation

## 2020-12-23 NOTE — Progress Notes (Signed)
Trigger Point Injection  Indication: Cervical myofascial pain not relieved by medication management and other conservative care.  Informed consent was obtained after describing risk and benefits of the procedure with the patient, this includes bleeding, bruising, infection and medication side effects.  The patient wishes to proceed and has given written consent.  The patient was placed in a seated position.  The cervical area was marked and prepped with Betadine.  It was entered with a 25-gauge 1/2 inch needle and a total of 5 mL of 1% lidocaine and normal saline was injected into a total of 4 trigger points, after negative draw back for blood.  The patient tolerated the procedure well.  Post procedure instructions were given. 

## 2021-01-04 ENCOUNTER — Telehealth: Payer: Self-pay | Admitting: Cardiology

## 2021-01-04 NOTE — Telephone Encounter (Signed)
New message   Pt c/o Shortness Of Breath: STAT if SOB developed within the last 24 hours or pt is noticeably SOB on the phone  1. Are you currently SOB (can you hear that pt is SOB on the phone)?  no  2. How long have you been experiencing SOB? For 2 weeks , has had cough for 3 weeks   3. Are you SOB when sitting or when up moving around? Both , worse when laying down   4. Are you currently experiencing any other symptoms? cough

## 2021-01-04 NOTE — Telephone Encounter (Signed)
Pt c/o increased SOB for the last 3 weeks with consistent cough - denies weight gain (has actually lost around 10lbs) denies swelling/chest pain - pt advised to contact pcp for appt first

## 2021-01-04 NOTE — Telephone Encounter (Signed)
Agree with your assessment.  Thank you

## 2021-01-06 ENCOUNTER — Other Ambulatory Visit: Payer: Self-pay | Admitting: Physical Medicine and Rehabilitation

## 2021-01-06 DIAGNOSIS — R202 Paresthesia of skin: Secondary | ICD-10-CM

## 2021-01-06 DIAGNOSIS — Y33XXXD Other specified events, undetermined intent, subsequent encounter: Secondary | ICD-10-CM

## 2021-01-07 ENCOUNTER — Other Ambulatory Visit: Payer: Self-pay | Admitting: Physical Medicine and Rehabilitation

## 2021-01-10 NOTE — Progress Notes (Deleted)
Cardiology Office Note  Date: 01/10/2021   ID: Jacquan, Savas 09-21-1966, MRN [id number]  PCP:  Glenda Chroman, MD  Cardiologist:  Carlyle Dolly, MD Electrophysiologist:  None   Chief Complaint: Follow-up chest pain  History of Present Illness: [name] is a 55 y.o. male with a history of chest pain, hyperlipidemia, former smoker, elevated blood pressure.  Last seen by Dr. Harl Bowie on 12/21/2017 as a new consult by referral from PCP.  He was seen in emergency room November 27, 2017 with atypical chest pain.  His EKG showed LVH, strain pattern, troponins negative x2.  Chest x-ray small right effusion, left-sided pressure-like pain radiating to the shoulder blade needles-like feeling.  He had no other associated symptoms.  Worse with position changes.  Better with ibuprofen symptoms had resolved at the time of exam.  He had been on Lipitor several years prior.  His blood pressure was elevated on arrival.  He was taking ibuprofen at home for ongoing back pain.    Recent telephone encounter on 01/04/2021.  Complaint of shortness of breath.  He had been complaining of shortness of breath x2 weeks with cough x3 weeks.  Shortness of breath worse when laying down.  Denied any weight gain, swelling, chest pain.  Advised to contact PCP.    Echo ? Stress ? Monitor?  Past Medical History:  Diagnosis Date  . Cancer [facility])    colon cancer  . High cholesterol     Past Surgical History:  Procedure Laterality Date  . COLON RESECTION    . HERNIA REPAIR      Current Outpatient Medications  Medication Sig Dispense Refill  . meloxicam (MOBIC) 7.5 MG tablet TAKE 1 TABLET BY MOUTH DAILY AS NEEDED FOR PAIN 30 tablet 1  . amLODipine (NORVASC) 5 MG tablet Take 1 tablet (5 mg total) by mouth daily. 90 tablet 1  . cyclobenzaprine (FLEXERIL) 5 MG tablet Take 5 mg by mouth 2 (two) times daily as needed for muscle spasms.    Marland Kitchen gabapentin (NEURONTIN) 800 MG tablet Take 800 mg by mouth 2 (two)  times daily.    Marland Kitchen loratadine (CLARITIN) 10 MG tablet Take 10 mg by mouth daily as needed for allergies.    Marland Kitchen meclizine (ANTIVERT) 12.5 MG tablet Take 1 tablet (12.5 mg total) by mouth 3 (three) times daily as needed for dizziness. 90 tablet 3  . senna-docusate (SENOKOT-S) 8.6-50 MG tablet Take 1 tablet by mouth daily.    Marland Kitchen topiramate (TOPAMAX) 25 MG tablet Take 1 tablet (25 mg total) by mouth 2 (two) times daily. 90 tablet 1   No current facility-administered medications for this visit.   Allergies:  Patient has no known allergies.   Social History: The patient  reports that he has quit smoking. He has never used smokeless tobacco. He reports that he does not drink alcohol and does not use drugs.   Family History: The patient's family history includes Cancer in his father; Heart attack in his cousin; Hypertension in his brother, mother, and sister.   ROS:  Please see the history of present illness. Otherwise, complete review of systems is positive for none.  All other systems are reviewed and negative.   Physical Exam: VS:  There were no vitals taken for this visit., BMI There is no height or weight on file to calculate BMI.  Wt Readings from Last 3 Encounters:  12/23/20 195 lb (88.5 kg)  11/09/20 205 lb 3.2 oz (93.1 kg)  11/06/20  202 lb 12.8 oz (92 kg)    General: Patient appears comfortable at rest. Neck: Supple, no elevated JVP or carotid bruits, no thyromegaly. Lungs: Clear to auscultation, nonlabored breathing at rest. Cardiac: Regular rate and rhythm, no S3 or significant systolic murmur, no pericardial rub. Extremities: No pitting edema, distal pulses 2+. Skin: Warm and dry. Musculoskeletal: No kyphosis. Neuropsychiatric: Alert and oriented x3, affect grossly appropriate.  ECG: 11/09/2020 sinus rhythm with premature atrial complexes rate of 61, left ventricular hypertrophy with repolarization abnormality.  Recent Labwork: No results found for requested labs within last 8760  hours.  No results found for: CHOL, TRIG, HDL, CHOLHDL, VLDL, LDLCALC, LDLDIRECT  Other Studies Reviewed Today:  Vascular US lower ext. Art. Segmentals 04/22/2020 Summary: Right: Resting right ankle-brachial index is within normal range. No evidence of significant right lower extremity arterial disease. The right toe-brachial index is normal. Left: Resting left ankle-brachial index is within normal range. No evidence of significant left lower extremity arterial disease. The left toe-brachial index is normal.  Assessment and Plan:   1. SOB    2. Orthopnea   3. Chest pain of uncertain etiology Patient denies any recent anginal or exertional symptoms.  EKG today sinus rhythm with premature atrial complexes rate of 61, LVH with repolarization abnormality.  States he had a recent episode for approximately 1 week course right-sided chest discomfort without radiation or associated symptoms.  States he does a lot of heavy labor-intensive work pushing large amounts of weight and lifting.  States chest discomfort resolved after 1 week.  He states it was aggravated by movement..  2. Elevated blood pressure reading Blood pressure has improved since starting amlodipine 5 mg daily.  Blood pressure today is 120/80.  Continue amlodipine 5 mg daily.    Medication Adjustments/Labs and Tests Ordered: Current medicines are reviewed at length with the patient today.  Concerns regarding medicines are outlined above.   Disposition: Follow-up with Dr. Harl Bowie or APP 6 months  Signed, Levell July, NP 01/10/2021 9:31 PM    [facility] [place], [place],  [address] Phone: [phone]; Fax: [phone]

## 2021-01-11 ENCOUNTER — Ambulatory Visit: Payer: BC Managed Care – PPO | Admitting: Family Medicine

## 2021-01-11 DIAGNOSIS — R0602 Shortness of breath: Secondary | ICD-10-CM

## 2021-01-11 DIAGNOSIS — R0601 Orthopnea: Secondary | ICD-10-CM

## 2021-01-13 ENCOUNTER — Other Ambulatory Visit: Payer: Self-pay | Admitting: Physical Medicine and Rehabilitation

## 2021-01-13 DIAGNOSIS — N2889 Other specified disorders of kidney and ureter: Secondary | ICD-10-CM

## 2021-01-13 MED ORDER — LIDOCAINE 5 % EX PTCH: 1.0000 | MEDICATED_PATCH | Freq: Two times a day (BID) | CUTANEOUS | 0 refills | Status: DC

## 2021-01-22 ENCOUNTER — Other Ambulatory Visit: Payer: Self-pay

## 2021-01-22 ENCOUNTER — Inpatient Hospital Stay (HOSPITAL_COMMUNITY)
Admission: AD | Admit: 2021-01-22 | Discharge: 2021-01-26 | DRG: 686 | Disposition: A | Discharge status: Home / Self Care | Payer: BC Managed Care – PPO | Source: Other Acute Inpatient Hospital | Attending: Internal Medicine | Admitting: Internal Medicine

## 2021-01-22 ENCOUNTER — Encounter (HOSPITAL_COMMUNITY): Payer: Self-pay | Admitting: Internal Medicine

## 2021-01-22 DIAGNOSIS — J910 Malignant pleural effusion: Secondary | ICD-10-CM | POA: Diagnosis present

## 2021-01-22 DIAGNOSIS — C799 Secondary malignant neoplasm of unspecified site: Secondary | ICD-10-CM | POA: Diagnosis present

## 2021-01-22 DIAGNOSIS — C7801 Secondary malignant neoplasm of right lung: Secondary | ICD-10-CM | POA: Diagnosis present

## 2021-01-22 DIAGNOSIS — C786 Secondary malignant neoplasm of retroperitoneum and peritoneum: Secondary | ICD-10-CM | POA: Diagnosis present

## 2021-01-22 DIAGNOSIS — I10 Essential (primary) hypertension: Secondary | ICD-10-CM | POA: Diagnosis not present

## 2021-01-22 DIAGNOSIS — E785 Hyperlipidemia, unspecified: Secondary | ICD-10-CM | POA: Diagnosis present

## 2021-01-22 DIAGNOSIS — Z79899 Other long term (current) drug therapy: Secondary | ICD-10-CM

## 2021-01-22 DIAGNOSIS — D631 Anemia in chronic kidney disease: Secondary | ICD-10-CM | POA: Diagnosis present

## 2021-01-22 DIAGNOSIS — N1832 Chronic kidney disease, stage 3b: Secondary | ICD-10-CM | POA: Diagnosis present

## 2021-01-22 DIAGNOSIS — C649 Malignant neoplasm of unspecified kidney, except renal pelvis: Secondary | ICD-10-CM | POA: Diagnosis present

## 2021-01-22 DIAGNOSIS — Z87891 Personal history of nicotine dependence: Secondary | ICD-10-CM | POA: Diagnosis not present

## 2021-01-22 DIAGNOSIS — Z8719 Personal history of other diseases of the digestive system: Secondary | ICD-10-CM | POA: Diagnosis not present

## 2021-01-22 DIAGNOSIS — R0602 Shortness of breath: Secondary | ICD-10-CM

## 2021-01-22 DIAGNOSIS — N179 Acute kidney failure, unspecified: Secondary | ICD-10-CM | POA: Diagnosis present

## 2021-01-22 DIAGNOSIS — I3139 Other pericardial effusion (noninflammatory): Secondary | ICD-10-CM | POA: Diagnosis present

## 2021-01-22 DIAGNOSIS — C642 Malignant neoplasm of left kidney, except renal pelvis: Principal | ICD-10-CM | POA: Diagnosis present

## 2021-01-22 DIAGNOSIS — I313 Pericardial effusion (noninflammatory): Secondary | ICD-10-CM | POA: Diagnosis present

## 2021-01-22 DIAGNOSIS — C787 Secondary malignant neoplasm of liver and intrahepatic bile duct: Secondary | ICD-10-CM | POA: Diagnosis present

## 2021-01-22 DIAGNOSIS — J9601 Acute respiratory failure with hypoxia: Secondary | ICD-10-CM | POA: Diagnosis present

## 2021-01-22 DIAGNOSIS — D630 Anemia in neoplastic disease: Secondary | ICD-10-CM | POA: Diagnosis present

## 2021-01-22 DIAGNOSIS — I129 Hypertensive chronic kidney disease with stage 1 through stage 4 chronic kidney disease, or unspecified chronic kidney disease: Secondary | ICD-10-CM | POA: Diagnosis present

## 2021-01-22 DIAGNOSIS — Z8249 Family history of ischemic heart disease and other diseases of the circulatory system: Secondary | ICD-10-CM

## 2021-01-22 DIAGNOSIS — J90 Pleural effusion, not elsewhere classified: Principal | ICD-10-CM

## 2021-01-22 DIAGNOSIS — C778 Secondary and unspecified malignant neoplasm of lymph nodes of multiple regions: Secondary | ICD-10-CM | POA: Diagnosis present

## 2021-01-22 DIAGNOSIS — Z9049 Acquired absence of other specified parts of digestive tract: Secondary | ICD-10-CM | POA: Diagnosis not present

## 2021-01-22 DIAGNOSIS — C7802 Secondary malignant neoplasm of left lung: Secondary | ICD-10-CM | POA: Diagnosis present

## 2021-01-22 DIAGNOSIS — E7800 Pure hypercholesterolemia, unspecified: Secondary | ICD-10-CM | POA: Diagnosis present

## 2021-01-22 DIAGNOSIS — Z85038 Personal history of other malignant neoplasm of large intestine: Secondary | ICD-10-CM | POA: Diagnosis not present

## 2021-01-22 DIAGNOSIS — C782 Secondary malignant neoplasm of pleura: Secondary | ICD-10-CM | POA: Diagnosis present

## 2021-01-22 HISTORY — DX: Essential (primary) hypertension: I10

## 2021-01-22 HISTORY — DX: Malignant neoplasm of colon, unspecified: C18.9

## 2021-01-22 HISTORY — DX: Gastrointestinal hemorrhage, unspecified: K92.2

## 2021-01-23 ENCOUNTER — Inpatient Hospital Stay (HOSPITAL_COMMUNITY): Payer: BC Managed Care – PPO

## 2021-01-23 ENCOUNTER — Encounter (HOSPITAL_COMMUNITY): Payer: Self-pay | Admitting: Internal Medicine

## 2021-01-23 DIAGNOSIS — Z85038 Personal history of other malignant neoplasm of large intestine: Secondary | ICD-10-CM

## 2021-01-23 DIAGNOSIS — C799 Secondary malignant neoplasm of unspecified site: Secondary | ICD-10-CM | POA: Diagnosis present

## 2021-01-23 DIAGNOSIS — I3139 Other pericardial effusion (noninflammatory): Secondary | ICD-10-CM | POA: Diagnosis present

## 2021-01-23 DIAGNOSIS — I313 Pericardial effusion (noninflammatory): Secondary | ICD-10-CM

## 2021-01-23 DIAGNOSIS — J9601 Acute respiratory failure with hypoxia: Secondary | ICD-10-CM | POA: Diagnosis present

## 2021-01-23 DIAGNOSIS — C649 Malignant neoplasm of unspecified kidney, except renal pelvis: Secondary | ICD-10-CM | POA: Diagnosis present

## 2021-01-23 DIAGNOSIS — I10 Essential (primary) hypertension: Secondary | ICD-10-CM

## 2021-01-23 DIAGNOSIS — C642 Malignant neoplasm of left kidney, except renal pelvis: Principal | ICD-10-CM

## 2021-01-23 DIAGNOSIS — J90 Pleural effusion, not elsewhere classified: Secondary | ICD-10-CM

## 2021-01-23 HISTORY — DX: Personal history of other malignant neoplasm of large intestine: Z85.038

## 2021-01-23 LAB — CBC WITH DIFFERENTIAL/PLATELET
Abs Immature Granulocytes: 0.08 10*3/uL — ABNORMAL HIGH (ref 0.00–0.07)
Basophils Absolute: 0.1 10*3/uL (ref 0.0–0.1)
Basophils Relative: 1 %
Eosinophils Absolute: 0.1 10*3/uL (ref 0.0–0.5)
Eosinophils Relative: 1 %
HCT: 30.2 % — ABNORMAL LOW (ref 39.0–52.0)
Hemoglobin: 9.9 g/dL — ABNORMAL LOW (ref 13.0–17.0)
Immature Granulocytes: 1 %
Lymphocytes Relative: 13 %
Lymphs Abs: 1.3 10*3/uL (ref 0.7–4.0)
MCH: 26.9 pg (ref 26.0–34.0)
MCHC: 32.8 g/dL (ref 30.0–36.0)
MCV: 82.1 fL (ref 80.0–100.0)
Monocytes Absolute: 0.8 10*3/uL (ref 0.1–1.0)
Monocytes Relative: 8 %
Neutro Abs: 7.8 10*3/uL — ABNORMAL HIGH (ref 1.7–7.7)
Neutrophils Relative %: 76 %
Platelets: 487 10*3/uL — ABNORMAL HIGH (ref 150–400)
RBC: 3.68 MIL/uL — ABNORMAL LOW (ref 4.22–5.81)
RDW: 14.6 % (ref 11.5–15.5)
WBC: 10.1 10*3/uL (ref 4.0–10.5)
nRBC: 0 % (ref 0.0–0.2)

## 2021-01-23 LAB — URINALYSIS, COMPLETE (UACMP) WITH MICROSCOPIC
Bacteria, UA: NONE SEEN
Bilirubin Urine: NEGATIVE
Glucose, UA: NEGATIVE mg/dL
Ketones, ur: NEGATIVE mg/dL
Leukocytes,Ua: NEGATIVE
Nitrite: NEGATIVE
Protein, ur: NEGATIVE mg/dL
Specific Gravity, Urine: 1.003 — ABNORMAL LOW (ref 1.005–1.030)
pH: 6 (ref 5.0–8.0)

## 2021-01-23 LAB — CREATININE, URINE, RANDOM: Creatinine, Urine: 24.49 mg/dL

## 2021-01-23 LAB — COMPREHENSIVE METABOLIC PANEL
ALT: 26 U/L (ref 0–44)
AST: 23 U/L (ref 15–41)
Albumin: 2.3 g/dL — ABNORMAL LOW (ref 3.5–5.0)
Alkaline Phosphatase: 45 U/L (ref 38–126)
Anion gap: 10 (ref 5–15)
BUN: 14 mg/dL (ref 6–20)
CO2: 23 mmol/L (ref 22–32)
Calcium: 8.7 mg/dL — ABNORMAL LOW (ref 8.9–10.3)
Chloride: 105 mmol/L (ref 98–111)
Creatinine, Ser: 1.53 mg/dL — ABNORMAL HIGH (ref 0.61–1.24)
GFR, Estimated: 53 mL/min — ABNORMAL LOW (ref >60)
Glucose, Bld: 103 mg/dL — ABNORMAL HIGH (ref 70–99)
Potassium: 4.7 mmol/L (ref 3.5–5.1)
Sodium: 138 mmol/L (ref 135–145)
Total Bilirubin: 0.8 mg/dL (ref 0.3–1.2)
Total Protein: 6.7 g/dL (ref 6.5–8.1)

## 2021-01-23 LAB — GRAM STAIN

## 2021-01-23 LAB — BODY FLUID CELL COUNT WITH DIFFERENTIAL
Eos, Fluid: 2 %
Lymphs, Fluid: 73 %
Monocyte-Macrophage-Serous Fluid: 12 % — ABNORMAL LOW (ref 50–90)
Neutrophil Count, Fluid: 13 % (ref 0–25)
Total Nucleated Cell Count, Fluid: 887 cu mm (ref 0–1000)

## 2021-01-23 LAB — PROTEIN, PLEURAL OR PERITONEAL FLUID: Total protein, fluid: 4.2 g/dL

## 2021-01-23 LAB — PROTIME-INR
INR: 1.2 (ref 0.8–1.2)
Prothrombin Time: 15 seconds (ref 11.4–15.2)

## 2021-01-23 LAB — SODIUM, URINE, RANDOM: Sodium, Ur: 19 mmol/L

## 2021-01-23 LAB — LACTATE DEHYDROGENASE, PLEURAL OR PERITONEAL FLUID: LD, Fluid: 281 U/L — ABNORMAL HIGH (ref 3–23)

## 2021-01-23 LAB — GLUCOSE, PLEURAL OR PERITONEAL FLUID: Glucose, Fluid: 91 mg/dL

## 2021-01-23 LAB — MAGNESIUM: Magnesium: 2.1 mg/dL (ref 1.7–2.4)

## 2021-01-23 LAB — TSH: TSH: 1.965 u[IU]/mL (ref 0.350–4.500)

## 2021-01-23 LAB — ALBUMIN, PLEURAL OR PERITONEAL FLUID: Albumin, Fluid: 2 g/dL

## 2021-01-23 LAB — LACTIC ACID, PLASMA: Lactic Acid, Venous: 1.7 mmol/L (ref 0.5–1.9)

## 2021-01-23 LAB — URIC ACID: Uric Acid, Serum: 7.9 mg/dL (ref 3.7–8.6)

## 2021-01-23 LAB — BRAIN NATRIURETIC PEPTIDE: B Natriuretic Peptide: 39.7 pg/mL (ref 0.0–100.0)

## 2021-01-23 LAB — HIV ANTIBODY (ROUTINE TESTING W REFLEX): HIV Screen 4th Generation wRfx: NONREACTIVE

## 2021-01-23 LAB — SEDIMENTATION RATE: Sed Rate: 108 mm/hr — ABNORMAL HIGH (ref 0–16)

## 2021-01-23 LAB — C-REACTIVE PROTEIN: CRP: 12.9 mg/dL — ABNORMAL HIGH (ref <1.0)

## 2021-01-23 LAB — APTT: aPTT: 38 seconds — ABNORMAL HIGH (ref 24–36)

## 2021-01-23 LAB — OSMOLALITY: Osmolality: 294 mOsm/kg (ref 275–295)

## 2021-01-23 MED ORDER — LIDOCAINE HCL (PF) 1 % IJ SOLN
INTRAMUSCULAR | Status: AC
  Filled 2021-01-23: qty 30

## 2021-01-23 MED ORDER — ONDANSETRON HCL 4 MG/2ML IJ SOLN
4.0000 mg | Freq: Four times a day (QID) | INTRAMUSCULAR | Status: DC | PRN
  Administered 2021-01-23: 4 mg via INTRAVENOUS
  Filled 2021-01-23: qty 2

## 2021-01-23 MED ORDER — LACTATED RINGERS IV SOLN: INTRAVENOUS | Status: AC

## 2021-01-23 MED ORDER — ORAL CARE MOUTH RINSE
15.0000 mL | Freq: Two times a day (BID) | OROMUCOSAL | Status: DC
  Administered 2021-01-24 – 2021-01-26 (×5): 15 mL via OROMUCOSAL

## 2021-01-23 MED ORDER — AMLODIPINE BESYLATE 5 MG PO TABS
5.0000 mg | ORAL_TABLET | Freq: Every day | ORAL | Status: DC
  Administered 2021-01-23 – 2021-01-26 (×4): 5 mg via ORAL
  Filled 2021-01-23 (×4): qty 1

## 2021-01-23 MED ORDER — LACTATED RINGERS IV SOLN: INTRAVENOUS | Status: DC

## 2021-01-23 MED ORDER — GABAPENTIN 400 MG PO CAPS
400.0000 mg | ORAL_CAPSULE | Freq: Two times a day (BID) | ORAL | Status: DC
  Filled 2021-01-23: qty 1

## 2021-01-23 MED ORDER — ADULT MULTIVITAMIN W/MINERALS CH
1.0000 | ORAL_TABLET | Freq: Every day | ORAL | Status: DC
  Administered 2021-01-23 – 2021-01-26 (×3): 1 via ORAL
  Filled 2021-01-23 (×4): qty 1

## 2021-01-23 MED ORDER — LORATADINE 10 MG PO TABS: 10.0000 mg | ORAL_TABLET | Freq: Every day | ORAL | Status: DC | PRN

## 2021-01-23 MED ORDER — CYCLOBENZAPRINE HCL 5 MG PO TABS: 5.0000 mg | ORAL_TABLET | Freq: Two times a day (BID) | ORAL | Status: DC | PRN

## 2021-01-23 MED ORDER — HYDRALAZINE HCL 20 MG/ML IJ SOLN: 10.0000 mg | Freq: Four times a day (QID) | INTRAMUSCULAR | Status: DC | PRN

## 2021-01-23 MED ORDER — HEPARIN SODIUM (PORCINE) 5000 UNIT/ML IJ SOLN
5000.0000 [IU] | Freq: Three times a day (TID) | INTRAMUSCULAR | Status: DC
  Administered 2021-01-23 – 2021-01-26 (×9): 5000 [IU] via SUBCUTANEOUS
  Filled 2021-01-23 (×9): qty 1

## 2021-01-23 MED ORDER — IOHEXOL 9 MG/ML PO SOLN
ORAL | Status: AC
  Administered 2021-01-23: 500 mL
  Filled 2021-01-23: qty 500

## 2021-01-23 MED ORDER — ACETAMINOPHEN 650 MG RE SUPP: 650.0000 mg | Freq: Four times a day (QID) | RECTAL | Status: DC | PRN

## 2021-01-23 MED ORDER — SENNOSIDES-DOCUSATE SODIUM 8.6-50 MG PO TABS
1.0000 | ORAL_TABLET | Freq: Every day | ORAL | Status: DC
  Administered 2021-01-25: 1 via ORAL
  Filled 2021-01-23 (×3): qty 1

## 2021-01-23 MED ORDER — ACETAMINOPHEN 325 MG PO TABS
650.0000 mg | ORAL_TABLET | Freq: Four times a day (QID) | ORAL | Status: DC | PRN
  Administered 2021-01-23: 650 mg via ORAL
  Filled 2021-01-23: qty 2

## 2021-01-23 MED ORDER — POLYETHYLENE GLYCOL 3350 17 G PO PACK: 17.0000 g | PACK | Freq: Every day | ORAL | Status: DC | PRN

## 2021-01-23 MED ORDER — IOHEXOL 300 MG/ML  SOLN
100.0000 mL | Freq: Once | INTRAMUSCULAR | Status: AC | PRN
  Administered 2021-01-23: 100 mL via INTRAVENOUS

## 2021-01-23 MED ORDER — GABAPENTIN 400 MG PO CAPS: 800.0000 mg | ORAL_CAPSULE | Freq: Two times a day (BID) | ORAL | Status: DC

## 2021-01-23 MED ORDER — ONDANSETRON HCL 4 MG PO TABS: 4.0000 mg | ORAL_TABLET | Freq: Four times a day (QID) | ORAL | Status: DC | PRN

## 2021-01-23 MED ORDER — TOPIRAMATE 25 MG PO TABS
25.0000 mg | ORAL_TABLET | Freq: Two times a day (BID) | ORAL | Status: DC
  Filled 2021-01-23: qty 1

## 2021-01-23 MED ORDER — ENSURE ENLIVE PO LIQD
237.0000 mL | Freq: Three times a day (TID) | ORAL | Status: DC
  Administered 2021-01-23 – 2021-01-26 (×7): 237 mL via ORAL

## 2021-01-23 NOTE — Evaluation (Signed)
Physical Therapy Evaluation Patient Details Name: [name] MRN: [id number] DOB: [birth year] Today's Date: 01/23/2021   History of Present Illness  55 year old male with PMH of colon cancer (2015 S/P hemicolectomy), hyperlipidemia, and HTN. He presented to [facility] ED for evaluation of progressive SOB. 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 Pt transferred to [facility] for further medical management. He underwent R thoracentesis 3/26.    Clinical Impression  Pt admitted with above diagnosis. PTA pt active and independent, living at home with his wife. On eval, he required min guard assist transfers, and min guard assist ambulation 150' without AD. Pt seen for PT eval follow R thoracentesis. SpO2 95% at rest on 2L. Mobilized on 3L with SpO2 93%. Returned to bed and 2L at end of session with SpO2 92%. Pt presents with steady gait. Deficits noted in activity tolerance. Pt currently with functional limitations due to the deficits listed below (see PT Problem List). Pt will benefit from skilled PT to increase their independence and safety with mobility to allow discharge to the venue listed below.  Pt to follow acutely. No follow up services indicated.      Follow Up Recommendations No PT follow up    Equipment Recommendations  None recommended by PT    Recommendations for Other Services       Precautions / Restrictions Precautions Precautions: Other (comment) Precaution Comments: watch sats and wean O2 as able      Mobility  Bed Mobility Overal bed mobility: Modified Independent             General bed mobility comments: HOB elevated    Transfers Overall transfer level: Needs assistance Equipment used: None Transfers: Sit to/from Stand Sit to Stand: Min guard         General transfer comment: min guard for  safety/lines  Ambulation/Gait Ambulation/Gait assistance: Min guard Gait Distance (Feet): 150 Feet Assistive device: None Gait Pattern/deviations: WFL(Within Functional Limits) Gait velocity: mildly decreased Gait velocity interpretation: >2.62 ft/sec, indicative of community ambulatory General Gait Details: Mobilized on 3L. SpO2 93%. Steady gait.  Stairs            Wheelchair Mobility    Modified Rankin (Stroke Patients Only)       Balance Overall balance assessment: No apparent balance deficits (not formally assessed)                                           Pertinent Vitals/Pain Pain Assessment: Faces Faces Pain Scale: Hurts little more Pain Location: R thoracentesis site/R chest Pain Descriptors / Indicators: Grimacing;Guarding;Discomfort Pain Intervention(s): Monitored during session;Repositioned    Home Living Family/patient expects to be discharged to:: Private residence Living Arrangements: Spouse/significant other Available Help at Discharge: Family;Available 24 hours/day Type of Home: House Home Access: Stairs to enter Entrance Stairs-Rails: None Entrance Stairs-Number of Steps: 2 Home Layout: One level;Laundry or work area in [employer]: None      Prior Function Level of Independence: Independent               Journalist, newspaper        Extremity/Trunk Assessment   Upper Extremity Assessment Upper Extremity Assessment: Overall WFL for tasks assessed    Lower Extremity Assessment Lower Extremity  Assessment: Overall WFL for tasks assessed    Cervical / Trunk Assessment Cervical / Trunk Assessment: Normal  Communication   Communication: No difficulties  Cognition Arousal/Alertness: Awake/alert Behavior During Therapy: WFL for tasks assessed/performed Overall Cognitive Status: Within Functional Limits for tasks assessed                                        General Comments General  comments (skin integrity, edema, etc.): SpO2 95% at rest on 2L. Mobilized on 3L with desat to 93%. Returned to 2L in bed at end of session, SpO2 92%.    Exercises     Assessment/Plan    PT Assessment Patient needs continued PT services  PT Problem List Decreased mobility;Decreased activity tolerance;Cardiopulmonary status limiting activity;Pain       PT Treatment Interventions Therapeutic activities;Gait training;Therapeutic exercise;Patient/family education;Stair training;Functional mobility training    PT Goals (Current goals can be found in the Care Plan section)  Acute Rehab PT Goals Patient Stated Goal: home PT Goal Formulation: With patient Time For Goal Achievement: 02/06/21 Potential to Achieve Goals: Good    Frequency Min 3X/week   Barriers to discharge        Co-evaluation               AM-PAC PT "6 Clicks" Mobility  Outcome Measure Help needed turning from your back to your side while in a flat bed without using bedrails?: None Help needed moving from lying on your back to sitting on the side of a flat bed without using bedrails?: None Help needed moving to and from a bed to a chair (including a wheelchair)?: A Little Help needed standing up from a chair using your arms (e.g., wheelchair or bedside chair)?: A Little Help needed to walk in hospital room?: A Little Help needed climbing 3-5 steps with a railing? : A Little 6 Click Score: 20    End of Session Equipment Utilized During Treatment: Gait belt;Oxygen Activity Tolerance: Patient tolerated treatment well Patient left: in bed;with call bell/phone within reach Nurse Communication: Mobility status PT Visit Diagnosis: Difficulty in walking, not elsewhere classified (R26.2)    Time: 7510-2585 PT Time Calculation (min) (ACUTE ONLY): 19 min   Charges:   PT Evaluation $PT Eval Moderate Complexity: 1 Mod          Lorrin Goodell, PT  Office # [phone] Pager [phone]   Lorriane Shire 01/23/2021, 2:11 PM

## 2021-01-23 NOTE — Consult Note (Signed)
I have been asked to see the patient by Dr. Lala Lund, for evaluation and management of left renal mass.  History of present illness: 55 year old man transferred from [facility] for shortness of breath found to have a large left renal mass with extension into the left renal vein.  The mass was seen on CT of the chest and is incompletely imaged.  Attempted to see patient however he is at a procedure for thoracentesis for large right-sided pleural effusion.   Review of systems: Unable to perform  Patient Active Problem List   Diagnosis Date Noted  . Pleural effusion on right 01/23/2021  . History of colon cancer 01/23/2021  . Essential hypertension 01/23/2021  . Metastatic renal cell carcinoma, left ([place]) 01/23/2021  . Acute respiratory failure with hypoxia ([place]) 01/23/2021  . Pericardial effusion 01/23/2021    No current facility-administered medications on file prior to encounter.   Current Outpatient Medications on File Prior to Encounter  Medication Sig Dispense Refill  . meloxicam (MOBIC) 7.5 MG tablet TAKE 1 TABLET BY MOUTH DAILY AS NEEDED FOR PAIN 30 tablet 1  . amLODipine (NORVASC) 5 MG tablet Take 1 tablet (5 mg total) by mouth daily. 90 tablet 1  . cyclobenzaprine (FLEXERIL) 5 MG tablet Take 5 mg by mouth 2 (two) times daily as needed for muscle spasms.    Marland Kitchen gabapentin (NEURONTIN) 800 MG tablet Take 800 mg by mouth 2 (two) times daily.    Marland Kitchen lidocaine (LIDODERM) 5 % Place 1 patch onto the skin every 12 (twelve) hours. Remove & Discard patch within 12 hours or as directed by MD 10 patch 0  . loratadine (CLARITIN) 10 MG tablet Take 10 mg by mouth daily as needed for allergies.    Marland Kitchen meclizine (ANTIVERT) 12.5 MG tablet Take 1 tablet (12.5 mg total) by mouth 3 (three) times daily as needed for dizziness. 90 tablet 3  . senna-docusate (SENOKOT-S) 8.6-50 MG tablet Take 1 tablet by mouth daily.    Marland Kitchen topiramate (TOPAMAX) 25 MG tablet Take 1 tablet (25 mg total) by mouth 2 (two)  times daily. 90 tablet 1    Past Medical History:  Diagnosis Date  . Cancer [facility])    colon cancer  . Colon cancer ([place])   . GIB (gastrointestinal bleeding)   . High cholesterol   . History of colon cancer 01/23/2021  . HTN (hypertension)     Past Surgical History:  Procedure Laterality Date  . COLON RESECTION    . HEMICOLECTOMY Right   . HERNIA REPAIR      Social History   Tobacco Use  . Smoking status: Former Research scientist (life sciences)  . Smokeless tobacco: Never Used  Vaping Use  . Vaping Use: Never used  Substance Use Topics  . Alcohol use: No  . Drug use: No    Family History  Problem Relation Age of Onset  . Hypertension Mother   . Hypertension Sister   . Hypertension Brother   . Heart attack Cousin   . Cancer Father     PE: Vitals:   01/23/21 0001 01/23/21 0333 01/23/21 0400 01/23/21 0718  BP:  (!) 144/83 132/81 (!) 141/81  Pulse:  97 93 93  Resp:  20 (!) 22 (!) 21  Temp:  98.7 F (37.1 C) 98.7 F (37.1 C) 98.3 F (36.8 C)  TempSrc:  Oral Oral Oral  SpO2:  96% 97% 96%  Weight: 83.4 kg     Height:       Unable to perform  Recent Labs    01/23/21 0048  WBC 10.1  HGB 9.9*  HCT 30.2*   Recent Labs    01/23/21 0048  NA 138  K 4.7  CL 105  CO2 23  GLUCOSE 103*  BUN 14  CREATININE 1.53*  CALCIUM 8.7*   Recent Labs    01/23/21 0048  INR 1.2   No results for input(s): LABURIN in the last 72 hours. No results found for this or any previous visit.  Imaging: Outside imaging from [facility] chest CT 12 x 9 cm heterogeneous left renal mass concerning for renal cell carcinoma with extension into the renal vein and incompletely imaged  Imp: 55 year old man with large 12 x 9 cm heterogeneous left renal mass with extension into renal vein concerning for renal cell carcinoma.  Recommendations: -Patient was off of the floor when attempted to go see him today -Recommend CT of the abdomen pelvis with and without contrast to further image renal mass -Based  on CT of abdomen pelvis results we will discuss surgical intervention with patient which will be coordinated as an outpatient  Thank you for involving me in this patient's care.  Please page with any further questions or concerns. Tait Balistreri D Brayton Baumgartner

## 2021-01-23 NOTE — Procedures (Signed)
PROCEDURE SUMMARY:  Successful US guided right thoracentesis. Yielded 2.5 L of bloody fluid. Patient tolerated procedure well. No immediate complications. EBL = trace  Specimen was sent for labs.  Post procedure chest X-ray pending.  Simra Fiebig S Abriel Geesey PA-C 01/23/2021 11:04 AM

## 2021-01-23 NOTE — Progress Notes (Signed)
PROGRESS NOTE                                                                                                                                                                                                             Patient Demographics:    [name], is a 55 y.o. male, DOB - 05-02-1966, GNF:[id number]  Outpatient Primary MD for the patient is Glenda Chroman, MD    LOS - 1  Admit date - 01/22/2021    No chief complaint on file.      Brief Narrative (HPI from H&P) - 55 year old male with past medical history of colon cancer (2015 S/P hemicolectomy), hyperlipidemia, hypertension, he presented with progressive SOB to [facility] emergency department for evaluation, workup showed CT imaging the chest which not only redemonstrated the large right pleural effusion but also identified a large heterogenous mass of the upper pole of the left kidney concerning for renal cell carcinoma with extension into the left renal vein.  Additionally there is evidence of a mild to moderate pericardial effusion, nodal lesions concerning for metastases in the mediastinum as well as bilateral hilar regions in addition to evidence of bilateral pleural mets and moderate pericardial effusion.  He was subsequently transferred to [facility] for further care.   Subjective:    [name] today has, No headache, No chest pain, No abdominal pain - No Nausea, No new weakness tingling or numbness,    Assessment  & Plan :     1. Acute Hypoxic Resp. Failure due to large right-sided pleural effusion suspicious for malignant with new diagnosis of left kidney mass suspicious of renal cell cancer and possible mild to moderate pericardial effusion.  He will be getting ultrasound-guided therapeutic and diagnostic thoracentesis on 01/23/2021 by IR, appropriate labs and cytology have been ordered, have also discussed the case with urologist Dr. Wynelle Cleveland who will  see the patient shortly, likely outpatient malignancy work-up.  Echo awaited.  For now continue supportive care with oxygen.    2.  Mild to moderate pericardial effusion.  Echocardiogram pending.  Monitor.  3. HTN.  On Norvasc.  4. AKI - hydrate and monitor, check Bladder scan & Ur. electrolytes.          Condition - Extremely Guarded  Family Communication  : Mother [name] [phone]  on 01/23/2021  Code Status :  Full  Consults  :  IR, Urology  PUD Prophylaxis : None   Procedures  :     [place] right thoracentesis on 01/23/2021 by IR.      Disposition Plan  :    Status is: Inpatient  Remains inpatient appropriate because:IV treatments appropriate due to intensity of illness or inability to take PO   Dispo: The patient is from: Home              Anticipated d/c is to: Home              Patient currently is not medically stable to d/c.   Difficult to place patient No   DVT Prophylaxis  :   Heparin    Lab Results  Component Value Date   PLT 487 (H) 01/23/2021    Diet :  Diet Order            Diet NPO time specified  Diet effective ____                  Inpatient Medications  Scheduled Meds: . amLODipine  5 mg Oral Daily  . gabapentin  400 mg Oral BID  . [START ON 01/24/2021] mouth rinse  15 mL Mouth Rinse BID  . senna-docusate  1 tablet Oral Daily  . topiramate  25 mg Oral BID   Continuous Infusions: . lactated ringers 125 mL/hr at 01/23/21 0345   PRN Meds:.acetaminophen **OR** [DISCONTINUED] acetaminophen, cyclobenzaprine, hydrALAZINE, loratadine, [DISCONTINUED] ondansetron **OR** ondansetron (ZOFRAN) IV, polyethylene glycol  Antibiotics  :    Anti-infectives (From admission, onward)   None       Time Spent in minutes  30   Lala Lund M.D on 01/23/2021 at 9:57 AM  To page go to [email or web address]   [facility] -  Office  [phone]    See all Orders from today for further details    Objective:   Vitals:   01/23/21 0001  01/23/21 0333 01/23/21 0400 01/23/21 0718  BP:  (!) 144/83 132/81 (!) 141/81  Pulse:  97 93 93  Resp:  20 (!) 22 (!) 21  Temp:  98.7 F (37.1 C) 98.7 F (37.1 C) 98.3 F (36.8 C)  TempSrc:  Oral Oral Oral  SpO2:  96% 97% 96%  Weight: 83.4 kg     Height:        Wt Readings from Last 3 Encounters:  01/23/21 83.4 kg  12/23/20 88.5 kg  11/09/20 93.1 kg     Intake/Output Summary (Last 24 hours) at 01/23/2021 0957 Last data filed at 01/23/2021 0720 Gross per 24 hour  Intake 349.44 ml  Output 225 ml  Net 124.44 ml     Physical Exam  Awake Alert, No new F.N deficits, Normal affect [place].AT,PERRAL Supple Neck,No JVD, No cervical lymphadenopathy appriciated.  Symmetrical Chest wall movement, Good air movement bilaterally, reduced R. Breath sounds RRR,No Gallops,Rubs or new Murmurs, No Parasternal Heave +ve B.Sounds, Abd Soft, No tenderness, No organomegaly appriciated, No rebound - guarding or rigidity. No Cyanosis, Clubbing or edema, No new Rash or bruise     Data Review:    CBC Recent Labs  Lab 01/23/21 0048  WBC 10.1  HGB 9.9*  HCT 30.2*  PLT 487*  MCV 82.1  MCH 26.9  MCHC 32.8  RDW 14.6  LYMPHSABS 1.3  MONOABS 0.8  EOSABS 0.1  BASOSABS 0.1    Recent Labs  Lab 01/23/21 0048 01/23/21 0111  NA 138  --   K 4.7  --  CL 105  --   CO2 23  --   GLUCOSE 103*  --   BUN 14  --   CREATININE 1.53*  --   CALCIUM 8.7*  --   AST 23  --   ALT 26  --   ALKPHOS 45  --   BILITOT 0.8  --   ALBUMIN 2.3*  --   MG 2.1  --   CRP  --  12.9*  LATICACIDVEN 1.7  --   INR 1.2  --   TSH  --  1.965  BNP 39.7  --     ------------------------------------------------------------------------------------------------------------------ No results for input(s): CHOL, HDL, LDLCALC, TRIG, CHOLHDL, LDLDIRECT in the last 72 hours.  No results found for:  HGBA1C ------------------------------------------------------------------------------------------------------------------ Recent Labs    01/23/21 0111  TSH 1.965    Cardiac Enzymes No results for input(s): CKMB, TROPONINI, MYOGLOBIN in the last 168 hours.  Invalid input(s): CK ------------------------------------------------------------------------------------------------------------------    Component Value Date/Time   BNP 39.7 01/23/2021 0048    Micro Results No results found for this or any previous visit (from the past 240 hour(s)).  Radiology Reports DG Chest 1 View  Result Date: 01/23/2021 CLINICAL DATA:  Evaluate pleural effusion EXAM: CHEST  1 VIEW COMPARISON:  11/27/2017 FINDINGS: Cardiac shadow is stable. Left lung is well aerated. Some fullness in the left suprahilar region is noted. Complete opacification of the right hemithorax is noted likely related to a large effusion and right lung consolidation. CT would be helpful for further evaluation. IMPRESSION: Complete opacification of the right hemithorax consistent with a large right-sided effusion. CT may be helpful for further evaluation. Mild fullness in the left suprahilar region. This could also be evaluated on CT examination. Electronically Signed   By: Inez Catalina M.D.   On: 01/23/2021 01:09

## 2021-01-23 NOTE — Progress Notes (Signed)
Initial Nutrition Assessment  DOCUMENTATION CODES:   Not applicable  INTERVENTION:    Ensure Enlive po TID, each supplement provides 350 kcal and 20 grams of protein  MVI with minerals daily  NUTRITION DIAGNOSIS:   Increased nutrient needs related to acute illness (newly found kidney mass, suspected cancer) as evidenced by estimated needs.  GOAL:   Patient will meet greater than or equal to 90% of their needs  MONITOR:   PO intake,Supplement acceptance  REASON FOR ASSESSMENT:   Malnutrition Screening Tool    ASSESSMENT:   55 yo male admitted with progressive SOB, large R pleural effusion, large kidney mass, moderate pericardial effusion. PMH includes colon cancer s/p hemicolectomy 2015, HLD, HTN.   Patient in ultrasound this AM.  Currently on a soft diet. Meal intakes not recorded.  On admission, patient reported recent poor intake and weight loss. Weight encounters reviewed.  Patient with severe weight loss: 14% within the past 6 months.   Suspect patient is malnourished; unable to obtain enough information at this time for identification of malnutrition.   Labs reviewed. Medications reviewed and include Senokot-S daily. IVF: LR at 75 ml/h  NUTRITION - FOCUSED PHYSICAL EXAM:  unable to complete  Diet Order:   Diet Order            DIET SOFT Room service appropriate? Yes; Fluid consistency: Thin  Diet effective now                 EDUCATION NEEDS:   Not appropriate for education at this time  Skin:  Skin Assessment: Reviewed RN Assessment  Last BM:  3/24  Height:   Ht Readings from Last 1 Encounters:  01/22/21 6' (1.829 m)    Weight:   Wt Readings from Last 1 Encounters:  01/23/21 83.4 kg    Ideal Body Weight:  80.9 kg  BMI:  Body mass index is 24.94 kg/m.  Estimated Nutritional Needs:   Kcal:  2400-2600  Protein:  115-130 gm  Fluid:  >/= 2.4 L    Lucas Mallow, RD, LDN, CNSC Please refer to Amion for contact information.

## 2021-01-23 NOTE — H&P (Signed)
History and Physical    [name] DZH:[id number] DOB: 1966-09-15 DOA: 01/22/2021  PCP: Glenda Chroman, MD  Patient coming from: [facility]   Chief Complaint: shortness of breath   HPI:   55 year old male with past medical history of colon cancer (2015 S/P hemicolectomy), hyperlipidemia, hypertension who presents to [facility] as a transfer from [facility] emergency department due to acute hypoxic respiratory failure.  Patient explains that approximately 3 weeks ago he began to develop a cough.  Initially his cough is mild in intensity but has continued to worsen it occasionally became productive with clear sputum.  As patient's cough worsened patient also began to develop associated shortness of breath.  Shortness of breath initially was mild in intensity but progressively became more more severe.  Shortness of breath is worse with exertion and improved with rest.  Patient is also began to develop paroxysmal nocturnal dyspnea and pillow orthopnea.  Patient denies any associated peripheral edema or increasing abdominal girth.  Patient does also complain of intermittent mild left-sided chest tightness that is not associated with exertion.  Upon further questioning, patient reports a 15 pound nonintentional weight loss in the past several weeks in addition to decreasing oral intake due to lack of appetite and generalized weakness. Patient denies fevers, denies sick contacts, denies recent travel and denies any confirmed contact with COVID-19 infection.  Patient eventually presented to [facility] emergency department for evaluation.  Upon arrival, patient was found to be hypoxic requiring initiation of supplemental oxygen therapy.  At that point, chest x-ray revealed a large right-sided pleural effusion.  This was followed up with CT imaging the chest which not only redemonstrated the large right pleural effusion but also identified a large heterogenous mass of the upper pole  of the left kidney concerning for renal cell carcinoma with extension into the left renal vein.  Additionally there is evidence of a mild to moderate pericardial effusion, nodal lesions concerning for metastases in the mediastinum as well as bilateral hilar regions in addition to evidence of bilateral pleural mets.  COVID-19 PCR testing was performed and was found to be negative.  Due to the fact that [facility] does not have interventional radiology noted to have urology or pulmonology [facility] was contacted and patient was accepted for transfer for continued medical care.  Review of Systems:   Review of Systems  Constitutional: Positive for weight loss.  Respiratory: Positive for cough, sputum production and shortness of breath.   Cardiovascular: Positive for chest pain, orthopnea and PND.  Neurological: Positive for weakness.  All other systems reviewed and are negative.   Past Medical History:  Diagnosis Date   Cancer [facility])    colon cancer   Colon cancer ([place])    GIB (gastrointestinal bleeding)    High cholesterol    History of colon cancer 01/23/2021   HTN (hypertension)     Past Surgical History:  Procedure Laterality Date   COLON RESECTION     HEMICOLECTOMY Right    HERNIA REPAIR       reports that he has quit smoking. He has never used smokeless tobacco. He reports that he does not drink alcohol and does not use drugs.  No Known Allergies  Family History  Problem Relation Age of Onset   Hypertension Mother    Hypertension Sister    Hypertension Brother    Heart attack Cousin    Cancer Father      Prior to Admission medications  Medication Sig Start Date End Date Taking? Authorizing Provider  meloxicam (MOBIC) 7.5 MG tablet TAKE 1 TABLET BY MOUTH DAILY AS NEEDED FOR PAIN 01/07/21   Raulkar, Clide Deutscher, MD  amLODipine (NORVASC) 5 MG tablet Take 1 tablet (5 mg total) by mouth daily. 03/18/20 06/16/20  Verta Ellen., NP   cyclobenzaprine (FLEXERIL) 5 MG tablet Take 5 mg by mouth 2 (two) times daily as needed for muscle spasms.    [provider]  gabapentin (NEURONTIN) 800 MG tablet Take 800 mg by mouth 2 (two) times daily.    [provider]  lidocaine (LIDODERM) 5 % Place 1 patch onto the skin every 12 (twelve) hours. Remove & Discard patch within 12 hours or as directed by MD 01/13/21 01/13/22  Raulkar, Clide Deutscher, MD  loratadine (CLARITIN) 10 MG tablet Take 10 mg by mouth daily as needed for allergies.    [provider]  meclizine (ANTIVERT) 12.5 MG tablet Take 1 tablet (12.5 mg total) by mouth 3 (three) times daily as needed for dizziness. 03/17/20   Raulkar, Clide Deutscher, MD  senna-docusate (SENOKOT-S) 8.6-50 MG tablet Take 1 tablet by mouth daily. 05/24/17   [provider]  topiramate (TOPAMAX) 25 MG tablet Take 1 tablet (25 mg total) by mouth 2 (two) times daily. 02/14/20   Izora Ribas, MD    Physical Exam: Vitals:   01/22/21 2240 01/22/21 2304 01/22/21 2342 01/23/21 0001  BP:   137/79   Pulse: 100  97   Resp: (!) 22  20   Temp:   98.7 F (37.1 C)   TempSrc: Oral  Oral   SpO2: 98%  97%   Weight:    83.4 kg  Height:  6' (1.829 m)      Constitutional: Awake alert and oriented x3, no associated distress.   Skin: no rashes, no lesions, good skin turgor noted. Eyes: Pupils are equally reactive to light.  No evidence of scleral icterus or conjunctival pallor.  ENMT: Moist mucous membranes noted.  Posterior pharynx clear of any exudate or lesions.   Neck: normal, supple, no masses, no thyromegaly.  No evidence of jugular venous distension.   Respiratory: Markedly diminished breath sounds throughout the right lung fields with increased dullness to percussion.  No significant evidence of rales or wheezing.  Increased respiratory effort without evidence of accessory muscle use. Cardiovascular: Regular rate and rhythm, no murmurs / rubs / gallops. No extremity edema. 2+  pedal pulses. No carotid bruits.  Chest:   Nontender without crepitus or deformity.   Back:   Nontender without crepitus or deformity. Abdomen: Left-sided abdominal tenderness noted.  Abdomen is soft however.  Notable left upper quadrant abdominal fullness on palpation. Positive bowel sounds noted in all quadrants.   Musculoskeletal: No joint deformity upper and lower extremities. Good ROM, no contractures. Normal muscle tone.  Neurologic: CN 2-12 grossly intact. Sensation intact.  Patient moving all 4 extremities spontaneously.  Patient is following all commands.  Patient is responsive to verbal stimuli.   Psychiatric: Patient exhibits normal mood with appropriate affect.  Patient seems to possess insight as to their current situation.     Labs on Admission: I have personally reviewed following labs and imaging studies -   CBC: No results for input(s): WBC, NEUTROABS, HGB, HCT, MCV, PLT in the last 168 hours. Basic Metabolic Panel: No results for input(s): NA, K, CL, CO2, GLUCOSE, BUN, CREATININE, CALCIUM, MG, PHOS in the last 168 hours. GFR: CrCl cannot be  calculated (Patient's most recent lab result is older than the maximum 21 days allowed.). Liver Function Tests: No results for input(s): AST, ALT, ALKPHOS, BILITOT, PROT, ALBUMIN in the last 168 hours. No results for input(s): LIPASE, AMYLASE in the last 168 hours. No results for input(s): AMMONIA in the last 168 hours. Coagulation Profile: No results for input(s): INR, PROTIME in the last 168 hours. Cardiac Enzymes: No results for input(s): CKTOTAL, CKMB, CKMBINDEX, TROPONINI in the last 168 hours. BNP (last 3 results) No results for input(s): PROBNP in the last 8760 hours. HbA1C: No results for input(s): HGBA1C in the last 72 hours. CBG: No results for input(s): GLUCAP in the last 168 hours. Lipid Profile: No results for input(s): CHOL, HDL, LDLCALC, TRIG, CHOLHDL, LDLDIRECT in the last 72 hours. Thyroid Function Tests: No  results for input(s): TSH, T4TOTAL, FREET4, T3FREE, THYROIDAB in the last 72 hours. Anemia Panel: No results for input(s): VITAMINB12, FOLATE, FERRITIN, TIBC, IRON, RETICCTPCT in the last 72 hours. Urine analysis: No results found for: COLORURINE, APPEARANCEUR, LABSPEC, [place], GLUCOSEU, HGBUR, BILIRUBINUR, KETONESUR, PROTEINUR, UROBILINOGEN, NITRITE, LEUKOCYTESUR  Radiological Exams on Admission - Personally Reviewed: DG Chest 1 View  Result Date: 01/23/2021 CLINICAL DATA:  Evaluate pleural effusion EXAM: CHEST  1 VIEW COMPARISON:  11/27/2017 FINDINGS: Cardiac shadow is stable. Left lung is well aerated. Some fullness in the left suprahilar region is noted. Complete opacification of the right hemithorax is noted likely related to a large effusion and right lung consolidation. CT would be helpful for further evaluation. IMPRESSION: Complete opacification of the right hemithorax consistent with a large right-sided effusion. CT may be helpful for further evaluation. Mild fullness in the left suprahilar region. This could also be evaluated on CT examination. Electronically Signed   By: Inez Catalina M.D.   On: 01/23/2021 01:09    Telemetry: Personally reviewed.  Normal sinus rhythm 95 bpm.  Assessment/Plan Principal Problem:   Acute respiratory failure with hypoxia (HCC)   Thought to be secondary to large right-sided pleural effusion  Pleural effusion felt to be secondary to malignancy/metastatic renal cell carcinoma  Patient to undergo IR consultation in the morning with thoracentesis which should prove to be diagnostic and therapeutic.  Cytology can be performed on the fluid which can be used to identify malignant cells and confirm the diagnosis of metastatic renal cell carcinoma.  Providing patient with supplemental oxygen in the meantime  I do not believe that administration of intravenous would be effective at this time    Active Problems:   Pleural effusion on right   Please  see assessment and plan above    Metastatic renal cell carcinoma, left (HCC)   Notable large heterogenous mass the upper pole of the left kidney concerning for renal cell carcinoma.  This is particularly convincing considering the concurrent evidence of metastatic disease in the mediastinum with additional involvement of the hilar regions and pleural metastases.  Will review cytology results status post thoracentesis to identify if malignant cells are present that would confirm renal cell carcinoma  Patient would also benefit from discussion with urology in the AM  - they can help decide whether workup / surgical intervention can be pursued as an inpatient versus outpatient.  Pericardial effusion   Evidence of mild to moderate pericardial effusion on CT imaging of the chest  No clinical evidence of tamponade  Echocardiogram ordered for the morning    Essential hypertension   Continue home regimen of amlodipine as blood pressure tolerates   Code Status:  Full code Family Communication: deferred   Status is: Inpatient  Remains inpatient appropriate because:Ongoing diagnostic testing needed not appropriate for outpatient work up, IV treatments appropriate due to intensity of illness or inability to take PO and Inpatient level of care appropriate due to severity of illness   Dispo: The patient is from: Home              Anticipated d/c is to: Home              Patient currently is not medically stable to d/c.   Difficult to place patient No        Vernelle Emerald MD [facility] Pager [phone]  If 7PM-7AM, please contact night-coverage [email or web address] Use universal [place] password for that web site. If you do not have the password, please call the hospital operator.  01/23/2021, 1:17 AM

## 2021-01-24 ENCOUNTER — Inpatient Hospital Stay (HOSPITAL_COMMUNITY): Payer: BC Managed Care – PPO

## 2021-01-24 DIAGNOSIS — I313 Pericardial effusion (noninflammatory): Secondary | ICD-10-CM | POA: Diagnosis not present

## 2021-01-24 LAB — ECHOCARDIOGRAM COMPLETE
Area-P 1/2: 2.91 cm2
Height: 72 in
S' Lateral: 2.3 cm
Weight: 2941.82 oz

## 2021-01-24 LAB — CBC WITH DIFFERENTIAL/PLATELET
Abs Immature Granulocytes: 0.03 10*3/uL (ref 0.00–0.07)
Basophils Absolute: 0.1 10*3/uL (ref 0.0–0.1)
Basophils Relative: 1 %
Eosinophils Absolute: 0.2 10*3/uL (ref 0.0–0.5)
Eosinophils Relative: 2 %
HCT: 30 % — ABNORMAL LOW (ref 39.0–52.0)
Hemoglobin: 9.5 g/dL — ABNORMAL LOW (ref 13.0–17.0)
Immature Granulocytes: 0 %
Lymphocytes Relative: 15 %
Lymphs Abs: 1.4 10*3/uL (ref 0.7–4.0)
MCH: 26.5 pg (ref 26.0–34.0)
MCHC: 31.7 g/dL (ref 30.0–36.0)
MCV: 83.6 fL (ref 80.0–100.0)
Monocytes Absolute: 0.8 10*3/uL (ref 0.1–1.0)
Monocytes Relative: 9 %
Neutro Abs: 6.9 10*3/uL (ref 1.7–7.7)
Neutrophils Relative %: 73 %
Platelets: 471 10*3/uL — ABNORMAL HIGH (ref 150–400)
RBC: 3.59 MIL/uL — ABNORMAL LOW (ref 4.22–5.81)
RDW: 14.6 % (ref 11.5–15.5)
WBC: 9.3 10*3/uL (ref 4.0–10.5)
nRBC: 0 % (ref 0.0–0.2)

## 2021-01-24 LAB — COMPREHENSIVE METABOLIC PANEL
ALT: 25 U/L (ref 0–44)
AST: 21 U/L (ref 15–41)
Albumin: 2.1 g/dL — ABNORMAL LOW (ref 3.5–5.0)
Alkaline Phosphatase: 52 U/L (ref 38–126)
Anion gap: 9 (ref 5–15)
BUN: 15 mg/dL (ref 6–20)
CO2: 24 mmol/L (ref 22–32)
Calcium: 8.6 mg/dL — ABNORMAL LOW (ref 8.9–10.3)
Chloride: 103 mmol/L (ref 98–111)
Creatinine, Ser: 1.55 mg/dL — ABNORMAL HIGH (ref 0.61–1.24)
GFR, Estimated: 53 mL/min — ABNORMAL LOW (ref >60)
Glucose, Bld: 101 mg/dL — ABNORMAL HIGH (ref 70–99)
Potassium: 4.5 mmol/L (ref 3.5–5.1)
Sodium: 136 mmol/L (ref 135–145)
Total Bilirubin: 0.6 mg/dL (ref 0.3–1.2)
Total Protein: 6 g/dL — ABNORMAL LOW (ref 6.5–8.1)

## 2021-01-24 LAB — LACTATE DEHYDROGENASE: LDH: 196 U/L — ABNORMAL HIGH (ref 98–192)

## 2021-01-24 LAB — BRAIN NATRIURETIC PEPTIDE: B Natriuretic Peptide: 40 pg/mL (ref 0.0–100.0)

## 2021-01-24 LAB — C-REACTIVE PROTEIN: CRP: 15 mg/dL — ABNORMAL HIGH (ref <1.0)

## 2021-01-24 LAB — MAGNESIUM: Magnesium: 2.2 mg/dL (ref 1.7–2.4)

## 2021-01-24 NOTE — Progress Notes (Signed)
PROGRESS NOTE                                                                                                                                                                                                             Patient Demographics:    [name], is a 55 y.o. male, DOB - February 17, 1966, YSA:[id number]  Outpatient Primary MD for the patient is Glenda Chroman, MD    LOS - 2  Admit date - 01/22/2021    No chief complaint on file.      Brief Narrative (HPI from H&P) - 55 year old male with past medical history of colon cancer (2015 S/P hemicolectomy), hyperlipidemia, hypertension, he presented with progressive SOB to [facility] emergency department for evaluation, workup showed CT imaging the chest which not only redemonstrated the large right pleural effusion but also identified a large heterogenous mass of the upper pole of the left kidney concerning for renal cell carcinoma with extension into the left renal vein.  Additionally there is evidence of a mild to moderate pericardial effusion, nodal lesions concerning for metastases in the mediastinum as well as bilateral hilar regions in addition to evidence of bilateral pleural mets and moderate pericardial effusion.  He was subsequently transferred to [facility] for further care.   Subjective:   Patient in bed, appears comfortable, denies any headache, no fever, no chest pain or pressure, improved shortness of breath , no abdominal pain. No focal weakness.    Assessment  & Plan :     1. Acute Hypoxic Resp. Failure due to large Right-Sided pleural effusion suspicious for malignant with new diagnosis of left kidney mass suspicious of renal cell cancer and possible mild to moderate pericardial effusion.  He underwent ultrasound-guided therapeutic and diagnostic R. thoracentesis on 01/23/2021 by IR, he had 2.5 L of exudative fluid, cytology pending urology and oncology  consulted and are following.  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  Still has large residual pleural effusion on the right side may require another thoracentesis.    2.  Mild to moderate pericardial effusion.  Echocardiogram pending.  Monitor.  3. HTN.  On Norvasc.  4. AKI - renal function has somewhat stabilized abdominal monitor.          Condition - Extremely Guarded  Family Communication  : Mother [name] [phone]  on 01/23/2021  Code Status : Full  Consults  :  IR, Urology, Oncology  PUD Prophylaxis : None   Procedures  :     [place] right thoracentesis on 01/23/2021 by IR - 2.5lit exudative fluid removed.  CT - 15.1 cm left upper pole renal mass, compatible with advanced renal cell carcinoma. Expansile tumor thrombus in the left renal vein. Associated left para-aortic nodal metastases. Small hepatic metastases. Bilateral pulmonary metastases, incompletely visualized. Extensive pleural metastases in the right hemithorax with a moderate to large pleural effusion. Thoracic nodal metastases, incompletely visualized      Disposition Plan  :    Status is: Inpatient  Remains inpatient appropriate because:IV treatments appropriate due to intensity of illness or inability to take PO   Dispo: The patient is from: Home              Anticipated d/c is to: Home              Patient currently is not medically stable to d/c.   Difficult to place patient No   DVT Prophylaxis  :   Heparin    Lab Results  Component Value Date   PLT 471 (H) 01/24/2021    Diet :  Diet Order            Diet regular Room service appropriate? Yes; Fluid consistency: Thin  Diet effective now                  Inpatient Medications  Scheduled Meds: . amLODipine  5 mg Oral Daily  . feeding supplement  237 mL Oral TID BM  . heparin injection (subcutaneous)  5,000 Units Subcutaneous Q8H  . mouth rinse  15 mL Mouth Rinse BID  . multivitamin with minerals  1 tablet Oral  Daily  . senna-docusate  1 tablet Oral Daily   Continuous Infusions: . lactated ringers 75 mL/hr at 01/23/21 1140   PRN Meds:.acetaminophen **OR** [DISCONTINUED] acetaminophen, cyclobenzaprine, hydrALAZINE, loratadine, [DISCONTINUED] ondansetron **OR** ondansetron (ZOFRAN) IV, polyethylene glycol  Antibiotics  :    Anti-infectives (From admission, onward)   None       Time Spent in minutes  30   Lala Lund M.D on 01/24/2021 at 10:35 AM  To page go to [email or web address]   [facility] -  Office  [phone]    See all Orders from today for further details    Objective:   Vitals:   01/23/21 2016 01/24/21 0505 01/24/21 0920 01/24/21 0922  BP: 129/75 127/74    Pulse: (!) 110 96    Resp: 18 19    Temp: 98.8 F (37.1 C) 98.6 F (37 C)    TempSrc: Oral Oral    SpO2: 95% 93% (!) 88% 92%  Weight:      Height:        Wt Readings from Last 3 Encounters:  01/23/21 83.4 kg  12/23/20 88.5 kg  11/09/20 93.1 kg     Intake/Output Summary (Last 24 hours) at 01/24/2021 1035 Last data filed at 01/23/2021 1950 Gross per 24 hour  Intake 622.38 ml  Output 1225 ml  Net -602.62 ml     Physical Exam  Awake Alert, No new F.N deficits, Normal affect [place].AT,PERRAL Supple Neck,No JVD, No cervical lymphadenopathy appriciated.  Symmetrical Chest wall movement, Good air movement bilaterally, CTAB RRR,No Gallops, Rubs or new Murmurs, No Parasternal Heave +ve B.Sounds, Abd Soft, No tenderness, No organomegaly appriciated, No rebound - guarding or rigidity. No Cyanosis, Clubbing or edema, No  new Rash or bruise     Data Review:    CBC Recent Labs  Lab 01/23/21 0048 01/24/21 0211  WBC 10.1 9.3  HGB 9.9* 9.5*  HCT 30.2* 30.0*  PLT 487* 471*  MCV 82.1 83.6  MCH 26.9 26.5  MCHC 32.8 31.7  RDW 14.6 14.6  LYMPHSABS 1.3 1.4  MONOABS 0.8 0.8  EOSABS 0.1 0.2  BASOSABS 0.1 0.1    Recent Labs  Lab 01/23/21 0048 01/23/21 0111 01/24/21 0211  NA 138  --  136  K 4.7   --  4.5  CL 105  --  103  CO2 23  --  24  GLUCOSE 103*  --  101*  BUN 14  --  15  CREATININE 1.53*  --  1.55*  CALCIUM 8.7*  --  8.6*  AST 23  --  21  ALT 26  --  25  ALKPHOS 45  --  52  BILITOT 0.8  --  0.6  ALBUMIN 2.3*  --  2.1*  MG 2.1  --  2.2  CRP  --  12.9* 15.0*  LATICACIDVEN 1.7  --   --   INR 1.2  --   --   TSH  --  1.965  --   BNP 39.7  --  40.0    ------------------------------------------------------------------------------------------------------------------ No results for input(s): CHOL, HDL, LDLCALC, TRIG, CHOLHDL, LDLDIRECT in the last 72 hours.  No results found for: HGBA1C ------------------------------------------------------------------------------------------------------------------ Recent Labs    01/23/21 0111  TSH 1.965    Cardiac Enzymes No results for input(s): CKMB, TROPONINI, MYOGLOBIN in the last 168 hours.  Invalid input(s): CK ------------------------------------------------------------------------------------------------------------------    Component Value Date/Time   BNP 40.0 01/24/2021 0211    Micro Results Recent Results (from the past 240 hour(s))  Culture, body fluid w Gram Stain-bottle     Status: None (Preliminary result)   Collection Time: 01/23/21 11:21 AM   Specimen: Pleura  Result Value Ref Range Status   Specimen Description PLEURAL  Final   Special Requests FLUID  Final   Culture   Final    NO GROWTH <12 HOURS Performed at [facility], [address] [place], [place] [address]    Report Status PENDING  Incomplete  Gram stain     Status: None   Collection Time: 01/23/21 11:21 AM   Specimen: Pleura  Result Value Ref Range Status   Specimen Description PLEURAL  Final   Special Requests FLUID  Final   Gram Stain   Final    RARE WBC PRESENT, PREDOMINANTLY PMN NO ORGANISMS SEEN Performed at [facility], [place] [address], [place],  [address]    Report Status 01/23/2021 FINAL  Final     Radiology Reports CT ABDOMEN PELVIS W WO CONTRAST  Result Date: 01/24/2021 CLINICAL DATA:  Renal mass EXAM: CT ABDOMEN AND PELVIS WITHOUT AND WITH CONTRAST TECHNIQUE: Multidetector CT imaging of the abdomen and pelvis was performed following the standard protocol before and following the bolus administration of intravenous contrast. CONTRAST:  158mL OMNIPAQUE IOHEXOL 300 MG/ML  SOLN COMPARISON:  None FINDINGS: Lower chest: Moderate to large right pleural effusion with numerous pleural-based metastases throughout the right hemithorax. For example, a 3.2 x 5.6 cm mass abuts the right heart border (series 7/image 21) and a 3.0 x 5.7 cm mass abuts the posterior hemidiaphragm (series 7/image 40). Associated right middle and right lower lobe atelectasis. Small left pleural effusion with lower lobe atelectasis. Multiple small parenchymal and pleural pulmonary  nodules bilaterally, incompletely visualized, measuring up to 8 mm in the right middle lobe (series 6/image 8). Thoracic nodal metastases, incompletely visualized, including a dominant 2.7 cm subcarinal node (series 7/image 6). Hepatobiliary: 14 mm metastasis in segment 8 (series 11/image 54). Additional lesions may be present in the central left hepatic lobe (series 11/image 42) and central right hepatic lobe (series 11/image 63), indeterminate. Layering small gallstones (series 11/image 78), without associated inflammatory changes. No intrahepatic or extrahepatic ductal dilatation. Pancreas: Within normal limits. Spleen: Within normal limits. Adrenals/Urinary Tract: Right adrenal gland is within normal limits. Left adrenal gland is displaced anteriorly (series 7/image 58) but likely within normal limits. Right kidney is within normal limits.  No hydronephrosis. Large left upper pole renal mass, measuring at least 13.8 x 10.0 x 15.1 cm (series 7/image 72), extending beyond Gerota's fascia. This is compatible with advanced renal cell carcinoma. No  hydronephrosis. Bladder is within normal limits. Stomach/Bowel: Stomach is within normal limits. No evidence of bowel obstruction. Suspected right hemicolectomy with appendectomy. Vascular/Lymphatic: No evidence of abdominal aortic aneurysm. Atherosclerotic calcifications of the abdominal aorta and branch vessels. Expansile tumor thrombus in the left renal vein (series 7/image 70). Left para-aortic nodal metastases measuring up to 2.3 cm short axis (series 7/image 81). Reproductive: Prostate is unremarkable. Other: Trace pelvic ascites (series 11/image 139). No free air. Musculoskeletal: Old deformity involving the right posterior pelvis. No findings suspicious for lytic metastases. IMPRESSION: 15.1 cm left upper pole renal mass, compatible with advanced renal cell carcinoma. Expansile tumor thrombus in the left renal vein. Associated left para-aortic nodal metastases. Small hepatic metastases. Bilateral pulmonary metastases, incompletely visualized. Extensive pleural metastases in the right hemithorax with a moderate to large pleural effusion. Thoracic nodal metastases, incompletely visualized. Additional ancillary findings as above. Electronically Signed   By: Julian Hy M.D.   On: 01/24/2021 05:33   DG Chest 1 View  Result Date: 01/23/2021 CLINICAL DATA:  RIGHT pleural effusion post thoracentesis EXAM: CHEST  1 VIEW COMPARISON:  Exam at 1104 hours compared to earlier study of 01/01 hours FINDINGS: Enlargement of cardiac silhouette. Decrease in RIGHT pleural effusion since prior exam with aeration of the RIGHT upper lobe now identified. Persistent atelectasis versus consolidation of RIGHT middle and RIGHT lower lobes. LEFT lung clear. No pneumothorax. IMPRESSION: No pneumothorax following RIGHT thoracentesis. Electronically Signed   By: Lavonia Dana M.D.   On: 01/23/2021 13:14   DG Chest 1 View  Result Date: 01/23/2021 CLINICAL DATA:  Evaluate pleural effusion EXAM: CHEST  1 VIEW COMPARISON:   11/27/2017 FINDINGS: Cardiac shadow is stable. Left lung is well aerated. Some fullness in the left suprahilar region is noted. Complete opacification of the right hemithorax is noted likely related to a large effusion and right lung consolidation. CT would be helpful for further evaluation. IMPRESSION: Complete opacification of the right hemithorax consistent with a large right-sided effusion. CT may be helpful for further evaluation. Mild fullness in the left suprahilar region. This could also be evaluated on CT examination. Electronically Signed   By: Inez Catalina M.D.   On: 01/23/2021 01:09   DG Chest Port 1 View  Result Date: 01/24/2021 CLINICAL DATA:  Shortness of breath, right pleural effusion EXAM: PORTABLE CHEST 1 VIEW COMPARISON:  01/23/2021 FINDINGS: Moderate to large right pleural effusion. Associated right lung opacity, presumably compressive atelectasis, although superimposed pleural-based metastases are evident on CT. Left lung is clear. The heart is normal in size. IMPRESSION: Moderate to large right pleural effusion. Electronically Signed  By: Julian Hy M.D.   On: 01/24/2021 08:22   US THORACENTESIS ASP PLEURAL SPACE W/IMG GUIDE  Result Date: 01/23/2021 INDICATION: History of colon cancer with worsening shortness of breath and large right pleural effusion. Request for diagnostic and therapeutic thoracentesis. EXAM: ULTRASOUND GUIDED RIGHT THORACENTESIS MEDICATIONS: 1% lidocaine 10 mL COMPLICATIONS: None immediate. PROCEDURE: An ultrasound guided thoracentesis was thoroughly discussed with the patient and questions answered. The benefits, risks, alternatives and complications were also discussed. The patient understands and wishes to proceed with the procedure. Written consent was obtained. Ultrasound was performed to localize and mark an adequate pocket of fluid in the right chest. The area was then prepped and draped in the normal sterile fashion. 1% Lidocaine was used for local  anesthesia. Under ultrasound guidance a 6 Fr Safe-T-Centesis catheter was introduced. Thoracentesis was performed. The catheter was removed and a dressing applied. FINDINGS: A total of approximately 2.5 L of bloody fluid was removed. Samples were sent to the laboratory as requested by the clinical team. IMPRESSION: Successful ultrasound guided right thoracentesis yielding 2.5 L of pleural fluid. No pneumothorax on post-procedure chest x-ray. Read by: Gareth Eagle, PA-C Electronically Signed   By: Markus Daft M.D.   On: 01/23/2021 12:19

## 2021-01-24 NOTE — Progress Notes (Signed)
Urology Inpatient Progress Report   Intv/Subj: Unable to see patient yesterday due to him being off the floor for thoracentesis.  Subsequent CT of the abdomen pelvis showed a 15 cm left heterogeneous renal mass with extension into renal vein and pleural metastases and thoracic nodal metastases consistent with advanced renal cell carcinoma.  Patient denies any gross hematuria or family history of renal cell carcinoma.  Urinalysis negative for microscopic hematuria.  Patient lives in [place], [place].  He lives with his brother and aunt.     Principal Problem:   Acute respiratory failure with hypoxia (HCC) Active Problems:   Pleural effusion on right   Essential hypertension   Metastatic renal cell carcinoma, left (HCC)   Pericardial effusion  Current Facility-Administered Medications  Medication Dose Route Frequency Provider Last Rate Last Admin  . acetaminophen (TYLENOL) tablet 650 mg  650 mg Oral Q6H PRN Vernelle Emerald, MD   650 mg at 01/23/21 2131  . amLODipine (NORVASC) tablet 5 mg  5 mg Oral Daily Shalhoub, Sherryll Burger, MD   5 mg at 01/23/21 1227  . cyclobenzaprine (FLEXERIL) tablet 5 mg  5 mg Oral BID PRN Shalhoub, Sherryll Burger, MD      . feeding supplement (ENSURE ENLIVE / ENSURE PLUS) liquid 237 mL  237 mL Oral TID BM Thurnell Lose, MD   237 mL at 01/23/21 1943  . heparin injection 5,000 Units  5,000 Units Subcutaneous Q8H Thurnell Lose, MD   5,000 Units at 01/24/21 0540  . hydrALAZINE (APRESOLINE) injection 10 mg  10 mg Intravenous Q6H PRN Shalhoub, Sherryll Burger, MD      . lactated ringers infusion   Intravenous Continuous Thurnell Lose, MD 75 mL/hr at 01/23/21 1140 New Bag at 01/23/21 1140  . loratadine (CLARITIN) tablet 10 mg  10 mg Oral Daily PRN Vernelle Emerald, MD      . MEDLINE mouth rinse  15 mL Mouth Rinse BID Shalhoub, Sherryll Burger, MD      . multivitamin with minerals tablet 1 tablet  1 tablet Oral Daily Thurnell Lose, MD   1 tablet at 01/23/21 1226  .  ondansetron (ZOFRAN) injection 4 mg  4 mg Intravenous Q6H PRN Shalhoub, Sherryll Burger, MD   4 mg at 01/23/21 0345  . polyethylene glycol (MIRALAX / GLYCOLAX) packet 17 g  17 g Oral Daily PRN Shalhoub, Sherryll Burger, MD      . senna-docusate (Senokot-S) tablet 1 tablet  1 tablet Oral Daily Vernelle Emerald, MD         Objective: Vital: Vitals:   01/23/21 1624 01/23/21 2000 01/23/21 2016 01/24/21 0505  BP: 134/86  129/75 127/74  Pulse: 100 (!) 118 (!) 110 96  Resp: 19  18 19   Temp: 98.6 F (37 C)  98.8 F (37.1 C) 98.6 F (37 C)  TempSrc: Oral  Oral Oral  SpO2: 94% 94% 95% 93%  Weight:      Height:       I/Os: I/O last 3 completed shifts: In: 971.8 [P.O.:540; I.V.:431.8] Out: 1450 [Urine:1450]  Physical Exam:  General: Patient is in no apparent distress Lungs: Normal respiratory effort, chest expands symmetrically, 3L O2 [place] [facility]:  The abdomen is soft and nontender, well-healed midline incision and smaller left lower quadrant port site incisions GU: no CVA ttp Ext: lower extremities symmetric, no lee  Lab Results: Recent Labs    01/23/21 0048 01/24/21 0211  WBC 10.1 9.3  HGB 9.9* 9.5*  HCT 30.2* 30.0*  Recent Labs    01/23/21 0048 01/24/21 0211  NA 138 136  K 4.7 4.5  CL 105 103  CO2 23 24  GLUCOSE 103* 101*  BUN 14 15  CREATININE 1.53* 1.55*  CALCIUM 8.7* 8.6*   Recent Labs    01/23/21 0048  INR 1.2   No results for input(s): LABURIN in the last 72 hours. Results for orders placed or performed during the hospital encounter of 01/22/21  Culture, body fluid w Gram Stain-bottle     Status: None (Preliminary result)   Collection Time: 01/23/21 11:21 AM   Specimen: Pleura  Result Value Ref Range Status   Specimen Description PLEURAL  Final   Special Requests FLUID  Final   Culture   Final    NO GROWTH <12 HOURS Performed at [facility], [address], [place], [place] [address]    Report Status PENDING  Incomplete  Gram stain     Status: None    Collection Time: 01/23/21 11:21 AM   Specimen: Pleura  Result Value Ref Range Status   Specimen Description PLEURAL  Final   Special Requests FLUID  Final   Gram Stain   Final    RARE WBC PRESENT, PREDOMINANTLY PMN NO ORGANISMS SEEN Performed at [facility], [place] [address] [place], [place] [address]    Report Status 01/23/2021 FINAL  Final    Studies/Results: CT ABDOMEN PELVIS W WO CONTRAST  Result Date: 01/24/2021 CLINICAL DATA:  Renal mass EXAM: CT ABDOMEN AND PELVIS WITHOUT AND WITH CONTRAST TECHNIQUE: Multidetector CT imaging of the abdomen and pelvis was performed following the standard protocol before and following the bolus administration of intravenous contrast. CONTRAST:  122mL OMNIPAQUE IOHEXOL 300 MG/ML  SOLN COMPARISON:  None FINDINGS: Lower chest: Moderate to large right pleural effusion with numerous pleural-based metastases throughout the right hemithorax. For example, a 3.2 x 5.6 cm mass abuts the right heart border (series 7/image 21) and a 3.0 x 5.7 cm mass abuts the posterior hemidiaphragm (series 7/image 40). Associated right middle and right lower lobe atelectasis. Small left pleural effusion with lower lobe atelectasis. Multiple small parenchymal and pleural pulmonary nodules bilaterally, incompletely visualized, measuring up to 8 mm in the right middle lobe (series 6/image 8). Thoracic nodal metastases, incompletely visualized, including a dominant 2.7 cm subcarinal node (series 7/image 6). Hepatobiliary: 14 mm metastasis in segment 8 (series 11/image 54). Additional lesions may be present in the central left hepatic lobe (series 11/image 42) and central right hepatic lobe (series 11/image 63), indeterminate. Layering small gallstones (series 11/image 78), without associated inflammatory changes. No intrahepatic or extrahepatic ductal dilatation. Pancreas: Within normal limits. Spleen: Within normal limits. Adrenals/Urinary Tract: Right adrenal gland is within normal  limits. Left adrenal gland is displaced anteriorly (series 7/image 58) but likely within normal limits. Right kidney is within normal limits.  No hydronephrosis. Large left upper pole renal mass, measuring at least 13.8 x 10.0 x 15.1 cm (series 7/image 72), extending beyond Gerota's fascia. This is compatible with advanced renal cell carcinoma. No hydronephrosis. Bladder is within normal limits. Stomach/Bowel: Stomach is within normal limits. No evidence of bowel obstruction. Suspected right hemicolectomy with appendectomy. Vascular/Lymphatic: No evidence of abdominal aortic aneurysm. Atherosclerotic calcifications of the abdominal aorta and branch vessels. Expansile tumor thrombus in the left renal vein (series 7/image 70). Left para-aortic nodal metastases measuring up to 2.3 cm short axis (series 7/image 81). Reproductive: Prostate is unremarkable. Other: Trace pelvic ascites (series 11/image 139). No free  air. Musculoskeletal: Old deformity involving the right posterior pelvis. No findings suspicious for lytic metastases. IMPRESSION: 15.1 cm left upper pole renal mass, compatible with advanced renal cell carcinoma. Expansile tumor thrombus in the left renal vein. Associated left para-aortic nodal metastases. Small hepatic metastases. Bilateral pulmonary metastases, incompletely visualized. Extensive pleural metastases in the right hemithorax with a moderate to large pleural effusion. Thoracic nodal metastases, incompletely visualized. Additional ancillary findings as above. Electronically Signed   By: Julian Hy M.D.   On: 01/24/2021 05:33   DG Chest 1 View  Result Date: 01/23/2021 CLINICAL DATA:  RIGHT pleural effusion post thoracentesis EXAM: CHEST  1 VIEW COMPARISON:  Exam at 1104 hours compared to earlier study of 01/01 hours FINDINGS: Enlargement of cardiac silhouette. Decrease in RIGHT pleural effusion since prior exam with aeration of the RIGHT upper lobe now identified. Persistent atelectasis  versus consolidation of RIGHT middle and RIGHT lower lobes. LEFT lung clear. No pneumothorax. IMPRESSION: No pneumothorax following RIGHT thoracentesis. Electronically Signed   By: Lavonia Dana M.D.   On: 01/23/2021 13:14   DG Chest 1 View  Result Date: 01/23/2021 CLINICAL DATA:  Evaluate pleural effusion EXAM: CHEST  1 VIEW COMPARISON:  11/27/2017 FINDINGS: Cardiac shadow is stable. Left lung is well aerated. Some fullness in the left suprahilar region is noted. Complete opacification of the right hemithorax is noted likely related to a large effusion and right lung consolidation. CT would be helpful for further evaluation. IMPRESSION: Complete opacification of the right hemithorax consistent with a large right-sided effusion. CT may be helpful for further evaluation. Mild fullness in the left suprahilar region. This could also be evaluated on CT examination. Electronically Signed   By: Inez Catalina M.D.   On: 01/23/2021 01:09   US THORACENTESIS ASP PLEURAL SPACE W/IMG GUIDE  Result Date: 01/23/2021 INDICATION: History of colon cancer with worsening shortness of breath and large right pleural effusion. Request for diagnostic and therapeutic thoracentesis. EXAM: ULTRASOUND GUIDED RIGHT THORACENTESIS MEDICATIONS: 1% lidocaine 10 mL COMPLICATIONS: None immediate. PROCEDURE: An ultrasound guided thoracentesis was thoroughly discussed with the patient and questions answered. The benefits, risks, alternatives and complications were also discussed. The patient understands and wishes to proceed with the procedure. Written consent was obtained. Ultrasound was performed to localize and mark an adequate pocket of fluid in the right chest. The area was then prepped and draped in the normal sterile fashion. 1% Lidocaine was used for local anesthesia. Under ultrasound guidance a 6 Fr Safe-T-Centesis catheter was introduced. Thoracentesis was performed. The catheter was removed and a dressing applied. FINDINGS: A total of  approximately 2.5 L of bloody fluid was removed. Samples were sent to the laboratory as requested by the clinical team. IMPRESSION: Successful ultrasound guided right thoracentesis yielding 2.5 L of pleural fluid. No pneumothorax on post-procedure chest x-ray. Read by: Gareth Eagle, PA-C Electronically Signed   By: Markus Daft M.D.   On: 01/23/2021 12:19    Assessment/Plan: 55 year old man with newly diagnosed advanced metastatic renal cell carcinoma currently admitted with shortness of breath due to large right pleural effusion.  -discussed CT scan findings with patient which show advanced metastatic renal cell carcinoma -we talked about treatment options for advanced renal cell carcinoma which sometimes include a cytoreductive nephrectomy followed by adjuvant immunotherapy -recommend oncology consultation so that this can treated together with uro/onc -Would need to assess pulmonary function prior to seeing if patient was a good surgical candidate due to increased oxygen requirement and extensive pulmonary metastasis  Jacalyn Lefevre, MD Urology 01/24/2021, 7:36 AM

## 2021-01-24 NOTE — Progress Notes (Signed)
Occupational Therapy Evaluation Patient Details Name: [name] MRN: [id number] DOB: Mar 08, 1966 Today's Date: 01/24/2021    History of Present Illness 55 year old male with PMH of colon cancer (2015 S/P hemicolectomy), hyperlipidemia, and HTN. He presented to [facility] ED for evaluation of progressive SOB. 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 Pt transferred to [facility] for further medical management. He underwent R thoracentesis 3/26.   Clinical Impression   Prior to hospitalization, pt was living with his wife in a 1-level house with 2 STE. Pt reports performing his ADLs/ADL mobility/IADLs independently and no assistive device. Pt reports working, driving, and staying active at baseline. Today, pt received semi-reclined in bed, pt agreeable to OT eval. Pt presents with intact strength/ROM to all four extremities. Pt currently requires supplemental O2 via [place] (2L) during functional activity 2/2 low activity tolerance. Educated pt on monitoring SPO2 levels at home using a pulse ox and applying 1-2L of supplemental oxygen as needed to stay within the 90s. Pt is currently performing all ADLs/ADL mobility with Mod I and no AD. Additionally educated pt on energy conservation, activity pacing, PLB, and safety awareness. Pt is cleared for home from an acute care OT perspective and does not require post-acute OT services at this time. See recs below. OT signing off, please re-consult OT if pt's current functional status changes.     Follow Up Recommendations  No OT follow up;Supervision - Intermittent    Equipment Recommendations  Other (comment) (supplemental O2 (if care team agreeable))    Recommendations for Other Services Other (comment) (None)     Precautions / Restrictions Precautions Precautions: Other (comment) Precaution  Comments: watch sats and wean O2 as able Restrictions Weight Bearing Restrictions: No      Mobility Bed Mobility Overal bed mobility: Modified Independent   General bed mobility comments: HOB elevated    Transfers Overall transfer level: Modified independent Equipment used: None Transfers: Sit to/from Stand Sit to Stand: Modified independent (Device/Increase time)         General transfer comment: Mod I for functional mobility within room for ADLs    Balance Overall balance assessment: No apparent balance deficits (not formally assessed)       ADL either performed or assessed with clinical judgement   ADL Overall ADL's : Modified independent (using supplemental O2 (2L))     General ADL Comments: Mod I for all functional mobility/ADLs without AD, using 2L of O2     Vision Baseline Vision/History: Wears glasses Wears Glasses: At all times Patient Visual Report: No change from baseline Vision Assessment?: No apparent visual deficits     Perception Perception Perception Tested?: No   Praxis Praxis Praxis tested?: Not tested    Pertinent Vitals/Pain Pain Assessment: No/denies pain     Hand Dominance Right   Extremity/Trunk Assessment Upper Extremity Assessment Upper Extremity Assessment: Overall WFL for tasks assessed   Lower Extremity Assessment Lower Extremity Assessment: Overall WFL for tasks assessed   Cervical / Trunk Assessment Cervical / Trunk Assessment: Normal   Communication Communication Communication: No difficulties   Cognition Arousal/Alertness: Awake/alert Behavior During Therapy: WFL for tasks assessed/performed Overall Cognitive Status: Within Functional Limits for tasks assessed     General Comments  skin intact, no tingling or numbness, wearing 2L of O2 via [place]            Home  Living Family/patient expects to be discharged to:: Private residence Living Arrangements: Spouse/significant other Available Help at Discharge:  Family;Available 24 hours/day Type of Home: House Home Access: Stairs to enter [employer] of Steps: 2 Entrance Stairs-Rails: None Home Layout: One level;Other (Comment) (does not need to access basement)     Bathroom Shower/Tub: Teacher, early years/pre: Standard Bathroom Accessibility: Yes How Accessible: Accessible via walker Home Equipment: None          Prior Functioning/Environment Level of Independence: Independent        Comments: no O2 needs, Independent with ADLs/ADL mobility/IADLs and no assistive device, reports working        OT Problem List: Decreased activity tolerance;Cardiopulmonary status limiting activity      OT Treatment/Interventions:   N/A; pt cleared from an acute care OT perspective   OT Goals(Current goals can be found in the care plan section) Acute Rehab OT Goals Patient Stated Goal: home OT Goal Formulation: With patient Time For Goal Achievement: 02/07/21 Potential to Achieve Goals: Good  OT Frequency: Other (comment) (Cleared from an acute OT perspective)    AM-PAC OT "6 Clicks" Daily Activity     Outcome Measure Help from another person eating meals?: None Help from another person taking care of personal grooming?: None Help from another person toileting, which includes using toliet, bedpan, or urinal?: None Help from another person bathing (including washing, rinsing, drying)?: A Little Help from another person to put on and taking off regular upper body clothing?: None Help from another person to put on and taking off regular lower body clothing?: None 6 Click Score: 23   End of Session Nurse Communication: Mobility status;Other (comment) (need for supplemental oxygen)  Activity Tolerance: Patient tolerated treatment well Patient left: in chair;with call bell/phone within reach  OT Visit Diagnosis: Muscle weakness (generalized) (M62.81)                Time: 8657-8469 OT Time Calculation (min): 27  min Charges:  OT General Charges $OT Visit: 1 Visit OT Evaluation $OT Eval Low Complexity: 1 Low OT Treatments $Therapeutic Activity: 8-22 mins  Michel Bickers, OTR/L [facility] [phone]   Francesca Jewett 01/24/2021, 5:27 PM

## 2021-01-24 NOTE — Plan of Care (Signed)

## 2021-01-24 NOTE — Progress Notes (Signed)
  Echocardiogram 2D Echocardiogram has been performed.  [name] 01/24/2021, 10:33 AM

## 2021-01-25 ENCOUNTER — Encounter (HOSPITAL_COMMUNITY): Payer: Self-pay | Admitting: Internal Medicine

## 2021-01-25 ENCOUNTER — Inpatient Hospital Stay (HOSPITAL_COMMUNITY): Payer: BC Managed Care – PPO

## 2021-01-25 DIAGNOSIS — J9601 Acute respiratory failure with hypoxia: Secondary | ICD-10-CM

## 2021-01-25 HISTORY — PX: IR THORACENTESIS ASP PLEURAL SPACE W/IMG GUIDE: IMG5380

## 2021-01-25 LAB — CBC WITH DIFFERENTIAL/PLATELET
Abs Immature Granulocytes: 0.05 10*3/uL (ref 0.00–0.07)
Basophils Absolute: 0 10*3/uL (ref 0.0–0.1)
Basophils Relative: 0 %
Eosinophils Absolute: 0.2 10*3/uL (ref 0.0–0.5)
Eosinophils Relative: 2 %
HCT: 29.7 % — ABNORMAL LOW (ref 39.0–52.0)
Hemoglobin: 9.5 g/dL — ABNORMAL LOW (ref 13.0–17.0)
Immature Granulocytes: 1 %
Lymphocytes Relative: 13 %
Lymphs Abs: 1.3 10*3/uL (ref 0.7–4.0)
MCH: 26.5 pg (ref 26.0–34.0)
MCHC: 32 g/dL (ref 30.0–36.0)
MCV: 82.7 fL (ref 80.0–100.0)
Monocytes Absolute: 0.8 10*3/uL (ref 0.1–1.0)
Monocytes Relative: 7 %
Neutro Abs: 8.3 10*3/uL — ABNORMAL HIGH (ref 1.7–7.7)
Neutrophils Relative %: 77 %
Platelets: 412 10*3/uL — ABNORMAL HIGH (ref 150–400)
RBC: 3.59 MIL/uL — ABNORMAL LOW (ref 4.22–5.81)
RDW: 14.5 % (ref 11.5–15.5)
WBC: 10.7 10*3/uL — ABNORMAL HIGH (ref 4.0–10.5)
nRBC: 0 % (ref 0.0–0.2)

## 2021-01-25 LAB — COMPREHENSIVE METABOLIC PANEL
ALT: 29 U/L (ref 0–44)
AST: 25 U/L (ref 15–41)
Albumin: 2.1 g/dL — ABNORMAL LOW (ref 3.5–5.0)
Alkaline Phosphatase: 55 U/L (ref 38–126)
Anion gap: 6 (ref 5–15)
BUN: 17 mg/dL (ref 6–20)
CO2: 25 mmol/L (ref 22–32)
Calcium: 8.3 mg/dL — ABNORMAL LOW (ref 8.9–10.3)
Chloride: 104 mmol/L (ref 98–111)
Creatinine, Ser: 1.54 mg/dL — ABNORMAL HIGH (ref 0.61–1.24)
GFR, Estimated: 53 mL/min — ABNORMAL LOW (ref >60)
Glucose, Bld: 116 mg/dL — ABNORMAL HIGH (ref 70–99)
Potassium: 4.4 mmol/L (ref 3.5–5.1)
Sodium: 135 mmol/L (ref 135–145)
Total Bilirubin: 0.3 mg/dL (ref 0.3–1.2)
Total Protein: 6.2 g/dL — ABNORMAL LOW (ref 6.5–8.1)

## 2021-01-25 LAB — BRAIN NATRIURETIC PEPTIDE: B Natriuretic Peptide: 30.3 pg/mL (ref 0.0–100.0)

## 2021-01-25 LAB — PH, BODY FLUID: pH, Body Fluid: 7.4

## 2021-01-25 LAB — C-REACTIVE PROTEIN: CRP: 14 mg/dL — ABNORMAL HIGH (ref <1.0)

## 2021-01-25 LAB — MAGNESIUM: Magnesium: 2.2 mg/dL (ref 1.7–2.4)

## 2021-01-25 MED ORDER — LIDOCAINE HCL 1 % IJ SOLN
INTRAMUSCULAR | Status: AC
  Filled 2021-01-25: qty 20

## 2021-01-25 MED ORDER — LIDOCAINE HCL (PF) 1 % IJ SOLN
INTRAMUSCULAR | Status: DC | PRN
  Administered 2021-01-25: 10 mL

## 2021-01-25 NOTE — Consult Note (Addendum)
[place]  Telephone:([phone]) [phone] [phone]   MEDICAL ONCOLOGY - INITIAL CONSULTATION  Referral MD: Dr. Lala Lund  Reason for Referral: Left renal mass with large right pleural effusion  HPI: [name] is a 55 year old male with a past medical history significant for colon cancer (status post hemicolectomy in 2015 -previously followed by Dr. Jacquiline Doe at [place]), hyperlipidemia, hypertension.  The patient was transferred from [facility] emergency department to [facility] due to acute hypoxic respiratory failure.  The patient has a 3-week history of cough which progressively worsened.  He subsequently developed shortness of breath.  He has a recent 15 pound unintentional weight loss in the past few weeks and decreased oral intake due to lack of appetite and generalized weakness.  CT chest with contrast was performed on 01/22/2021 at [facility] and reviewed through care everywhere.  This showed a large solid heterogeneous mass arising off the upper pole of the left kidney consistent with primary renal cell carcinoma, tumor extension into the left renal vein identified, left retroperitoneal metastatic adenopathy, extensive nodal metastasis within the mediastinum and bilateral hilar regions, bilateral pleural metastasis with large malignant right pleural effusion, multiple pulmonary nodules identified compatible with pulmonary metastasis, small to moderate pericardial effusion.  The patient had a CT abdomen/pelvis performed 01/23/2021 which showed a 15.1 cm left upper pole renal mass compatible with advanced renal cell carcinoma, expansile tumor thrombus the left renal vein, so see the left para-aortic nodal metastases, small hepatic metastases, bilateral pulmonary metastases, extensive pleural metastases in the right hemithorax with moderate to large pleural effusion, thoracic nodal metastases.  The patient had ultrasound-guided thoracentesis performed 01/23/2021  with 2.5 L of fluid removed and a repeat thoracentesis performed earlier today with 2 L of fluid removed. Fluid from thoracentesis performed today was sent for cytology.  The patient has been seen by urology who discussed treatment options including cytoreductive nephrectomy followed by adjuvant immunotherapy and advised medical oncology consultation.  The patient was seen in his hospital room.  No family at the bedside.  The patient resides in in [place], [place].  The patient reports that he has had a decreased appetite and 10 to 15 pound weight loss recently.  He developed a nonproductive cough with shortness of breath at home.  The symptoms have improved following ultrasound-guided thoracentesis x2.  He is not having any other complaints today such as fevers, chills, headaches, dizziness, chest pain, abdominal pain, nausea, vomiting, constipation, diarrhea.  He has not noticed any flank or back pain.  Denies hematuria or other evidence of bleeding.  The patient is single.  He has 2 children.  He states that he lives with his mother, brother, and sister.  He denies history of alcohol use.  He has a history of smoking cigarettes and quit about 15 years ago.  Only history significant for a father with prostate cancer.  Medical oncology was asked see the patient for recommendations regarding his left renal mass.   Past Medical History:  Diagnosis Date  . Cancer [facility])    colon cancer  . Colon cancer ([place])   . GIB (gastrointestinal bleeding)   . High cholesterol   . History of colon cancer 01/23/2021  . HTN (hypertension)   :  Past Surgical History:  Procedure Laterality Date  . COLON RESECTION    . HEMICOLECTOMY Right   . HERNIA REPAIR    :  Current Facility-Administered Medications  Medication Dose Route Frequency Provider Last Rate Last Admin  .  acetaminophen (TYLENOL) tablet 650 mg  650 mg Oral Q6H PRN Vernelle Emerald, MD   650 mg at 01/23/21 2131  . amLODipine (NORVASC) tablet 5  mg  5 mg Oral Daily Shalhoub, Sherryll Burger, MD   5 mg at 01/25/21 0939  . cyclobenzaprine (FLEXERIL) tablet 5 mg  5 mg Oral BID PRN Shalhoub, Sherryll Burger, MD      . feeding supplement (ENSURE ENLIVE / ENSURE PLUS) liquid 237 mL  237 mL Oral TID BM Thurnell Lose, MD   237 mL at 01/25/21 0944  . heparin injection 5,000 Units  5,000 Units Subcutaneous Q8H Thurnell Lose, MD   5,000 Units at 01/25/21 [phone]  . hydrALAZINE (APRESOLINE) injection 10 mg  10 mg Intravenous Q6H PRN Shalhoub, Sherryll Burger, MD      . lidocaine (PF) (XYLOCAINE) 1 % injection   Infiltration PRN Louk, Alexandra M, PA-C   10 mL at 01/25/21 1030  . lidocaine (XYLOCAINE) 1 % (with pres) injection           . loratadine (CLARITIN) tablet 10 mg  10 mg Oral Daily PRN Vernelle Emerald, MD      . MEDLINE mouth rinse  15 mL Mouth Rinse BID Shalhoub, Sherryll Burger, MD   15 mL at 01/25/21 0944  . multivitamin with minerals tablet 1 tablet  1 tablet Oral Daily Thurnell Lose, MD   1 tablet at 01/24/21 [phone]  . ondansetron (ZOFRAN) injection 4 mg  4 mg Intravenous Q6H PRN Shalhoub, Sherryll Burger, MD   4 mg at 01/23/21 0345  . polyethylene glycol (MIRALAX / GLYCOLAX) packet 17 g  17 g Oral Daily PRN Shalhoub, Sherryll Burger, MD      . senna-docusate (Senokot-S) tablet 1 tablet  1 tablet Oral Daily Shalhoub, Sherryll Burger, MD   1 tablet at 01/25/21 [phone]     No Known Allergies:  Family History  Problem Relation Age of Onset  . Hypertension Mother   . Hypertension Sister   . Hypertension Brother   . Heart attack Cousin   . Cancer Father   :  Social History   Socioeconomic History  . Marital status: Single    Spouse name: Not on file  . Number of children: Not on file  . Years of education: Not on file  . Highest education level: Not on file  Occupational History  . Not on file  Tobacco Use  . Smoking status: Former Research scientist (life sciences)  . Smokeless tobacco: Never Used  Vaping Use  . Vaping Use: Never used  Substance and Sexual Activity  . Alcohol use: No   . Drug use: No  . Sexual activity: Not Currently  Other Topics Concern  . Not on file  Social History Narrative  . Not on file   Social Determinants of Health   Financial Resource Strain: Not on file  Food Insecurity: Not on file  Transportation Needs: Not on file  Physical Activity: Not on file  Stress: Not on file  Social Connections: Not on file  Intimate Partner Violence: Not on file  :  Review of Systems: A comprehensive 14 point review of systems was negative except as noted in the HPI.  Exam: Patient Vitals for the past 24 hrs:  BP Temp Temp src Pulse Resp SpO2 Weight  01/25/21 1043 125/69 - - - - - -  01/25/21 1029 123/75 - - - - - -  01/25/21 9371 137/86 - - - - - -  01/25/21 0800 - - -  94 17 95 % -  01/25/21 0350 124/83 98.9 F (37.2 C) Oral 97 (!) 22 94 % 83.9 kg  01/24/21 2113 129/76 98.2 F (36.8 C) Oral 95 18 94 % -  01/24/21 2028 135/75 98.8 F (37.1 C) Oral 100 (!) 28 94 % -  01/24/21 1336 123/73 98.6 F (37 C) Oral 89 16 94 % -    General: Awake and alert, no distress. Eyes:  no scleral icterus.   ENT:  There were no oropharyngeal lesions.   Lymphatics:  Negative cervical, supraclavicular or axillary adenopathy.   Respiratory: Diminished breath sounds bilaterally. Cardiovascular:  Regular rate and rhythm, S1/S2, without murmur, rub or gallop.  There was no pedal edema.   GI:  abdomen was soft, flat, nontender, nondistended, without organomegaly.   Musculoskeletal: Strength symmetrical in the upper and lower extremities. Skin exam was without echymosis, petichae.   Neuro exam was nonfocal. Patient was alert and oriented.  Attention was good.   Language was appropriate.  Mood was normal without depression.  Speech was not pressured.  Thought content was not tangential.     Lab Results  Component Value Date   WBC 10.7 (H) 01/25/2021   HGB 9.5 (L) 01/25/2021   HCT 29.7 (L) 01/25/2021   PLT 412 (H) 01/25/2021   GLUCOSE 116 (H) 01/25/2021   ALT 29  01/25/2021   AST 25 01/25/2021   NA 135 01/25/2021   K 4.4 01/25/2021   CL 104 01/25/2021   CREATININE 1.54 (H) 01/25/2021   BUN 17 01/25/2021   CO2 25 01/25/2021    CT ABDOMEN PELVIS W WO CONTRAST  Result Date: 01/24/2021 CLINICAL DATA:  Renal mass EXAM: CT ABDOMEN AND PELVIS WITHOUT AND WITH CONTRAST TECHNIQUE: Multidetector CT imaging of the abdomen and pelvis was performed following the standard protocol before and following the bolus administration of intravenous contrast. CONTRAST:  1103mL OMNIPAQUE IOHEXOL 300 MG/ML  SOLN COMPARISON:  None FINDINGS: Lower chest: Moderate to large right pleural effusion with numerous pleural-based metastases throughout the right hemithorax. For example, a 3.2 x 5.6 cm mass abuts the right heart border (series 7/image 21) and a 3.0 x 5.7 cm mass abuts the posterior hemidiaphragm (series 7/image 40). Associated right middle and right lower lobe atelectasis. Small left pleural effusion with lower lobe atelectasis. Multiple small parenchymal and pleural pulmonary nodules bilaterally, incompletely visualized, measuring up to 8 mm in the right middle lobe (series 6/image 8). Thoracic nodal metastases, incompletely visualized, including a dominant 2.7 cm subcarinal node (series 7/image 6). Hepatobiliary: 14 mm metastasis in segment 8 (series 11/image 54). Additional lesions may be present in the central left hepatic lobe (series 11/image 42) and central right hepatic lobe (series 11/image 63), indeterminate. Layering small gallstones (series 11/image 78), without associated inflammatory changes. No intrahepatic or extrahepatic ductal dilatation. Pancreas: Within normal limits. Spleen: Within normal limits. Adrenals/Urinary Tract: Right adrenal gland is within normal limits. Left adrenal gland is displaced anteriorly (series 7/image 58) but likely within normal limits. Right kidney is within normal limits.  No hydronephrosis. Large left upper pole renal mass, measuring at  least 13.8 x 10.0 x 15.1 cm (series 7/image 72), extending beyond Gerota's fascia. This is compatible with advanced renal cell carcinoma. No hydronephrosis. Bladder is within normal limits. Stomach/Bowel: Stomach is within normal limits. No evidence of bowel obstruction. Suspected right hemicolectomy with appendectomy. Vascular/Lymphatic: No evidence of abdominal aortic aneurysm. Atherosclerotic calcifications of the abdominal aorta and branch vessels. Expansile tumor thrombus in the left renal  vein (series 7/image 70). Left para-aortic nodal metastases measuring up to 2.3 cm short axis (series 7/image 81). Reproductive: Prostate is unremarkable. Other: Trace pelvic ascites (series 11/image 139). No free air. Musculoskeletal: Old deformity involving the right posterior pelvis. No findings suspicious for lytic metastases. IMPRESSION: 15.1 cm left upper pole renal mass, compatible with advanced renal cell carcinoma. Expansile tumor thrombus in the left renal vein. Associated left para-aortic nodal metastases. Small hepatic metastases. Bilateral pulmonary metastases, incompletely visualized. Extensive pleural metastases in the right hemithorax with a moderate to large pleural effusion. Thoracic nodal metastases, incompletely visualized. Additional ancillary findings as above. Electronically Signed   By: Julian Hy M.D.   On: 01/24/2021 05:33   DG Chest 1 View  Result Date: 01/25/2021 CLINICAL DATA:  Status post thoracentesis EXAM: CHEST  1 VIEW COMPARISON:  01/24/2021 FINDINGS: Small right pleural effusion with interval decreased compared with 01/24/2021. Right basilar atelectasis. Trace left pleural effusion. No pneumothorax. Stable cardiomediastinal silhouette. No aggressive osseous lesion. IMPRESSION: 1. Small right pleural effusion with interval decreased compared with 01/24/2021. No pneumothorax. Electronically Signed   By: Kathreen Devoid   On: 01/25/2021 11:10   DG Chest 1 View  Result Date:  01/23/2021 CLINICAL DATA:  RIGHT pleural effusion post thoracentesis EXAM: CHEST  1 VIEW COMPARISON:  Exam at 1104 hours compared to earlier study of 01/01 hours FINDINGS: Enlargement of cardiac silhouette. Decrease in RIGHT pleural effusion since prior exam with aeration of the RIGHT upper lobe now identified. Persistent atelectasis versus consolidation of RIGHT middle and RIGHT lower lobes. LEFT lung clear. No pneumothorax. IMPRESSION: No pneumothorax following RIGHT thoracentesis. Electronically Signed   By: Lavonia Dana M.D.   On: 01/23/2021 13:14   DG Chest 1 View  Result Date: 01/23/2021 CLINICAL DATA:  Evaluate pleural effusion EXAM: CHEST  1 VIEW COMPARISON:  11/27/2017 FINDINGS: Cardiac shadow is stable. Left lung is well aerated. Some fullness in the left suprahilar region is noted. Complete opacification of the right hemithorax is noted likely related to a large effusion and right lung consolidation. CT would be helpful for further evaluation. IMPRESSION: Complete opacification of the right hemithorax consistent with a large right-sided effusion. CT may be helpful for further evaluation. Mild fullness in the left suprahilar region. This could also be evaluated on CT examination. Electronically Signed   By: Inez Catalina M.D.   On: 01/23/2021 01:09   DG Chest Port 1 View  Result Date: 01/24/2021 CLINICAL DATA:  Shortness of breath, right pleural effusion EXAM: PORTABLE CHEST 1 VIEW COMPARISON:  01/23/2021 FINDINGS: Moderate to large right pleural effusion. Associated right lung opacity, presumably compressive atelectasis, although superimposed pleural-based metastases are evident on CT. Left lung is clear. The heart is normal in size. IMPRESSION: Moderate to large right pleural effusion. Electronically Signed   By: Julian Hy M.D.   On: 01/24/2021 08:22   ECHOCARDIOGRAM COMPLETE  Result Date: 01/24/2021    ECHOCARDIOGRAM REPORT   Patient Name:   [name] Date of Exam: 01/24/2021  Medical Rec #:  [id number]       Height:       72.0 in Accession #:    [id number]      Weight:       183.9 lb Date of Birth:  05/15/1966       BSA:          2.056 m Patient Age:    57 years        BP:  116/57 mmHg Patient Gender: M               HR:           105 bpm. Exam Location:  Inpatient Procedure: 2D Echo, Cardiac Doppler and Color Doppler Indications:    Pericardial effusion I31.3  History:        Patient has no prior history of Echocardiogram examinations.                 Risk Factors:Hypertension.  Sonographer:    Bernadene Person RDCS Referring Phys: [id number] [place]  1. 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 There is no outflow tract obstruction. Left ventricular ejection fraction, by estimation, is >75%. The left ventricle has hyperdynamic function. The left ventricle has no regional wall motion abnormalities. There is moderate asymmetric left ventricular hypertrophy of the apical segment. Left ventricular diastolic parameters are consistent with Grade I diastolic dysfunction (impaired relaxation). The average left ventricular global longitudinal strain is -20.1 %. The global longitudinal strain is normal.  2. Right ventricular systolic function is hyperdynamic. The right ventricular size is normal. There is moderately elevated pulmonary artery systolic pressure.  3. A small pericardial effusion is present. The pericardial effusion is circumferential. There is no evidence of cardiac tamponade.  4. The mitral valve is normal in structure. No evidence of mitral valve regurgitation.  5. Tricuspid valve regurgitation is moderate.  6. The aortic valve is tricuspid. Aortic valve regurgitation is not visualized. No aortic stenosis is present.  7. Aortic dilatation noted. There is mild dilatation of the aortic root, measuring 41 mm. There is mild dilatation of the ascending aorta, measuring 40 mm.  8. No thrombus/mass is seen in the inferior  vena cava. The inferior vena cava is normal in size with greater than 50% respiratory variability, suggesting right atrial pressure of 3 mmHg. 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 There is no outflow tract obstruction. Left ventricular ejection fraction, by estimation, is >75%. The left ventricle has hyperdynamic function. The left ventricle has no regional wall motion abnormalities. The average left ventricular global longitudinal strain is -20.1 %. The global longitudinal strain is normal. The left ventricular internal cavity size was normal in size. There is moderate asymmetric left ventricular hypertrophy of the apical segment. Left ventricular diastolic parameters are consistent with Grade I diastolic dysfunction (impaired relaxation). Normal left ventricular filling pressure. Right Ventricle: The right ventricular size is normal. No increase in right ventricular wall thickness. Right ventricular systolic function is hyperdynamic. There is moderately elevated pulmonary artery systolic pressure. The tricuspid regurgitant velocity is 3.63 m/s, and with an assumed right atrial pressure of 3 mmHg, the estimated right ventricular systolic pressure is 13.0 mmHg. Left Atrium: Left atrial size was normal in size. Right Atrium: Right atrial size was normal in size. Pericardium: A small pericardial effusion is present. The pericardial effusion is circumferential. There is no evidence of cardiac tamponade. Mitral Valve: The mitral valve is normal in structure. No evidence of mitral valve regurgitation. Tricuspid Valve: The tricuspid valve is normal in structure. Tricuspid valve regurgitation is moderate. Aortic Valve: The aortic valve is tricuspid. Aortic valve regurgitation is not visualized. No aortic stenosis is present. Pulmonic Valve: The pulmonic valve was normal in structure. Pulmonic valve regurgitation is not visualized. Aorta: Aortic  dilatation noted. There is mild dilatation of the aortic root, measuring 41 mm. There is mild dilatation of the ascending aorta, measuring 40 mm. Venous:  No thrombus/mass is seen in the inferior vena cava. The inferior vena cava is normal in size with greater than 50% respiratory variability, suggesting right atrial pressure of 3 mmHg. IAS/Shunts: There is redundancy of the interatrial septum. No atrial level shunt detected by color flow Doppler.  LEFT VENTRICLE PLAX 2D LVIDd:         4.20 cm  Diastology LVIDs:         2.30 cm  LV e' medial:    5.55 cm/s LV PW:         1.50 cm  LV E/e' medial:  9.6 LV IVS:        1.50 cm  LV e' lateral:   7.83 cm/s LVOT diam:     2.50 cm  LV E/e' lateral: 6.8 LV SV:         133 LV SV Index:   64       2D Longitudinal Strain LVOT Area:     4.91 cm 2D Strain GLS Avg:     -20.1 %  RIGHT VENTRICLE RV S prime:     17.90 cm/s TAPSE (M-mode): 2.6 cm LEFT ATRIUM             Index       RIGHT ATRIUM           Index LA diam:        2.60 cm 1.26 cm/m  RA Area:     14.60 cm LA Vol (A2C):   47.1 ml 22.91 ml/m RA Volume:   36.80 ml  17.90 ml/m LA Vol (A4C):   29.0 ml 14.11 ml/m LA Biplane Vol: 38.6 ml 18.78 ml/m  AORTIC VALVE LVOT Vmax:   180.00 cm/s LVOT Vmean:  117.000 cm/s LVOT VTI:    0.270 m  AORTA Ao Root diam: 4.10 cm Ao Asc diam:  4.00 cm MITRAL VALVE               TRICUSPID VALVE MV Area (PHT): 2.91 cm    TR Peak grad:   52.7 mmHg MV Decel Time: 261 msec    TR Vmax:        363.00 cm/s MV E velocity: 53.50 cm/s MV A velocity: 76.10 cm/s  SHUNTS MV E/A ratio:  0.70        Systemic VTI:  0.27 m                            Systemic Diam: 2.50 cm Dani Gobble Croitoru MD Electronically signed by Sanda Klein MD Signature Date/Time: 01/24/2021/12:07:29 PM    Final    US THORACENTESIS ASP PLEURAL SPACE W/IMG GUIDE  Result Date: 01/23/2021 INDICATION: History of colon cancer with worsening shortness of breath and large right pleural effusion. Request for diagnostic and therapeutic  thoracentesis. EXAM: ULTRASOUND GUIDED RIGHT THORACENTESIS MEDICATIONS: 1% lidocaine 10 mL COMPLICATIONS: None immediate. PROCEDURE: An ultrasound guided thoracentesis was thoroughly discussed with the patient and questions answered. The benefits, risks, alternatives and complications were also discussed. The patient understands and wishes to proceed with the procedure. Written consent was obtained. Ultrasound was performed to localize and mark an adequate pocket of fluid in the right chest. The area was then prepped and draped in the normal sterile fashion. 1% Lidocaine was used for local anesthesia. Under ultrasound guidance a 6 Fr Safe-T-Centesis catheter was introduced. Thoracentesis was performed. The catheter was removed and a dressing applied. FINDINGS: A total of approximately 2.5 L of bloody fluid  was removed. Samples were sent to the laboratory as requested by the clinical team. IMPRESSION: Successful ultrasound guided right thoracentesis yielding 2.5 L of pleural fluid. No pneumothorax on post-procedure chest x-ray. Read by: Gareth Eagle, PA-C Electronically Signed   By: Markus Daft M.D.   On: 01/23/2021 12:19     CT ABDOMEN PELVIS W WO CONTRAST  Result Date: 01/24/2021 CLINICAL DATA:  Renal mass EXAM: CT ABDOMEN AND PELVIS WITHOUT AND WITH CONTRAST TECHNIQUE: Multidetector CT imaging of the abdomen and pelvis was performed following the standard protocol before and following the bolus administration of intravenous contrast. CONTRAST:  111mL OMNIPAQUE IOHEXOL 300 MG/ML  SOLN COMPARISON:  None FINDINGS: Lower chest: Moderate to large right pleural effusion with numerous pleural-based metastases throughout the right hemithorax. For example, a 3.2 x 5.6 cm mass abuts the right heart border (series 7/image 21) and a 3.0 x 5.7 cm mass abuts the posterior hemidiaphragm (series 7/image 40). Associated right middle and right lower lobe atelectasis. Small left pleural effusion with lower lobe atelectasis.  Multiple small parenchymal and pleural pulmonary nodules bilaterally, incompletely visualized, measuring up to 8 mm in the right middle lobe (series 6/image 8). Thoracic nodal metastases, incompletely visualized, including a dominant 2.7 cm subcarinal node (series 7/image 6). Hepatobiliary: 14 mm metastasis in segment 8 (series 11/image 54). Additional lesions may be present in the central left hepatic lobe (series 11/image 42) and central right hepatic lobe (series 11/image 63), indeterminate. Layering small gallstones (series 11/image 78), without associated inflammatory changes. No intrahepatic or extrahepatic ductal dilatation. Pancreas: Within normal limits. Spleen: Within normal limits. Adrenals/Urinary Tract: Right adrenal gland is within normal limits. Left adrenal gland is displaced anteriorly (series 7/image 58) but likely within normal limits. Right kidney is within normal limits.  No hydronephrosis. Large left upper pole renal mass, measuring at least 13.8 x 10.0 x 15.1 cm (series 7/image 72), extending beyond Gerota's fascia. This is compatible with advanced renal cell carcinoma. No hydronephrosis. Bladder is within normal limits. Stomach/Bowel: Stomach is within normal limits. No evidence of bowel obstruction. Suspected right hemicolectomy with appendectomy. Vascular/Lymphatic: No evidence of abdominal aortic aneurysm. Atherosclerotic calcifications of the abdominal aorta and branch vessels. Expansile tumor thrombus in the left renal vein (series 7/image 70). Left para-aortic nodal metastases measuring up to 2.3 cm short axis (series 7/image 81). Reproductive: Prostate is unremarkable. Other: Trace pelvic ascites (series 11/image 139). No free air. Musculoskeletal: Old deformity involving the right posterior pelvis. No findings suspicious for lytic metastases. IMPRESSION: 15.1 cm left upper pole renal mass, compatible with advanced renal cell carcinoma. Expansile tumor thrombus in the left renal vein.  Associated left para-aortic nodal metastases. Small hepatic metastases. Bilateral pulmonary metastases, incompletely visualized. Extensive pleural metastases in the right hemithorax with a moderate to large pleural effusion. Thoracic nodal metastases, incompletely visualized. Additional ancillary findings as above. Electronically Signed   By: Julian Hy M.D.   On: 01/24/2021 05:33   DG Chest 1 View  Result Date: 01/25/2021 CLINICAL DATA:  Status post thoracentesis EXAM: CHEST  1 VIEW COMPARISON:  01/24/2021 FINDINGS: Small right pleural effusion with interval decreased compared with 01/24/2021. Right basilar atelectasis. Trace left pleural effusion. No pneumothorax. Stable cardiomediastinal silhouette. No aggressive osseous lesion. IMPRESSION: 1. Small right pleural effusion with interval decreased compared with 01/24/2021. No pneumothorax. Electronically Signed   By: Kathreen Devoid   On: 01/25/2021 11:10   DG Chest 1 View  Result Date: 01/23/2021 CLINICAL DATA:  RIGHT pleural effusion post thoracentesis EXAM: CHEST  1 VIEW COMPARISON:  Exam at 1104 hours compared to earlier study of 01/01 hours FINDINGS: Enlargement of cardiac silhouette. Decrease in RIGHT pleural effusion since prior exam with aeration of the RIGHT upper lobe now identified. Persistent atelectasis versus consolidation of RIGHT middle and RIGHT lower lobes. LEFT lung clear. No pneumothorax. IMPRESSION: No pneumothorax following RIGHT thoracentesis. Electronically Signed   By: Lavonia Dana M.D.   On: 01/23/2021 13:14   DG Chest 1 View  Result Date: 01/23/2021 CLINICAL DATA:  Evaluate pleural effusion EXAM: CHEST  1 VIEW COMPARISON:  11/27/2017 FINDINGS: Cardiac shadow is stable. Left lung is well aerated. Some fullness in the left suprahilar region is noted. Complete opacification of the right hemithorax is noted likely related to a large effusion and right lung consolidation. CT would be helpful for further evaluation. IMPRESSION:  Complete opacification of the right hemithorax consistent with a large right-sided effusion. CT may be helpful for further evaluation. Mild fullness in the left suprahilar region. This could also be evaluated on CT examination. Electronically Signed   By: Inez Catalina M.D.   On: 01/23/2021 01:09   DG Chest Port 1 View  Result Date: 01/24/2021 CLINICAL DATA:  Shortness of breath, right pleural effusion EXAM: PORTABLE CHEST 1 VIEW COMPARISON:  01/23/2021 FINDINGS: Moderate to large right pleural effusion. Associated right lung opacity, presumably compressive atelectasis, although superimposed pleural-based metastases are evident on CT. Left lung is clear. The heart is normal in size. IMPRESSION: Moderate to large right pleural effusion. Electronically Signed   By: Julian Hy M.D.   On: 01/24/2021 08:22   ECHOCARDIOGRAM COMPLETE  Result Date: 01/24/2021    ECHOCARDIOGRAM REPORT   Patient Name:   [name] Date of Exam: 01/24/2021 Medical Rec #:  [id number]       Height:       72.0 in Accession #:    [id number]      Weight:       183.9 lb Date of Birth:  [birth year]       BSA:          2.056 m Patient Age:    56 years        BP:           116/57 mmHg Patient Gender: M               HR:           105 bpm. Exam Location:  Inpatient Procedure: 2D Echo, Cardiac Doppler and Color Doppler Indications:    Pericardial effusion I31.3  History:        Patient has no prior history of Echocardiogram examinations.                 Risk Factors:Hypertension.  Sonographer:    Bernadene Person RDCS Referring Phys: [id number] [place]  1. 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 There is no outflow tract obstruction. Left ventricular ejection fraction, by estimation, is >75%. The left ventricle has hyperdynamic function. The left ventricle has no regional wall motion abnormalities. There is moderate asymmetric left ventricular hypertrophy of the apical segment.  Left ventricular diastolic parameters are consistent with Grade I diastolic dysfunction (impaired relaxation). The average left ventricular global longitudinal strain is -20.1 %. The global longitudinal strain is normal.  2. Right ventricular systolic function is hyperdynamic. The right ventricular size is normal. There is moderately elevated pulmonary artery systolic pressure.  3. A small pericardial effusion is present. The pericardial effusion is  circumferential. There is no evidence of cardiac tamponade.  4. The mitral valve is normal in structure. No evidence of mitral valve regurgitation.  5. Tricuspid valve regurgitation is moderate.  6. The aortic valve is tricuspid. Aortic valve regurgitation is not visualized. No aortic stenosis is present.  7. Aortic dilatation noted. There is mild dilatation of the aortic root, measuring 41 mm. There is mild dilatation of the ascending aorta, measuring 40 mm.  8. No thrombus/mass is seen in the inferior vena cava. The inferior vena cava is normal in size with greater than 50% respiratory variability, suggesting right atrial pressure of 3 mmHg. 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 There is no outflow tract obstruction. Left ventricular ejection fraction, by estimation, is >75%. The left ventricle has hyperdynamic function. The left ventricle has no regional wall motion abnormalities. The average left ventricular global longitudinal strain is -20.1 %. The global longitudinal strain is normal. The left ventricular internal cavity size was normal in size. There is moderate asymmetric left ventricular hypertrophy of the apical segment. Left ventricular diastolic parameters are consistent with Grade I diastolic dysfunction (impaired relaxation). Normal left ventricular filling pressure. Right Ventricle: The right ventricular size is normal. No increase in right ventricular wall thickness. Right ventricular  systolic function is hyperdynamic. There is moderately elevated pulmonary artery systolic pressure. The tricuspid regurgitant velocity is 3.63 m/s, and with an assumed right atrial pressure of 3 mmHg, the estimated right ventricular systolic pressure is 41.6 mmHg. Left Atrium: Left atrial size was normal in size. Right Atrium: Right atrial size was normal in size. Pericardium: A small pericardial effusion is present. The pericardial effusion is circumferential. There is no evidence of cardiac tamponade. Mitral Valve: The mitral valve is normal in structure. No evidence of mitral valve regurgitation. Tricuspid Valve: The tricuspid valve is normal in structure. Tricuspid valve regurgitation is moderate. Aortic Valve: The aortic valve is tricuspid. Aortic valve regurgitation is not visualized. No aortic stenosis is present. Pulmonic Valve: The pulmonic valve was normal in structure. Pulmonic valve regurgitation is not visualized. Aorta: Aortic dilatation noted. There is mild dilatation of the aortic root, measuring 41 mm. There is mild dilatation of the ascending aorta, measuring 40 mm. Venous: No thrombus/mass is seen in the inferior vena cava. The inferior vena cava is normal in size with greater than 50% respiratory variability, suggesting right atrial pressure of 3 mmHg. IAS/Shunts: There is redundancy of the interatrial septum. No atrial level shunt detected by color flow Doppler.  LEFT VENTRICLE PLAX 2D LVIDd:         4.20 cm  Diastology LVIDs:         2.30 cm  LV e' medial:    5.55 cm/s LV PW:         1.50 cm  LV E/e' medial:  9.6 LV IVS:        1.50 cm  LV e' lateral:   7.83 cm/s LVOT diam:     2.50 cm  LV E/e' lateral: 6.8 LV SV:         133 LV SV Index:   64       2D Longitudinal Strain LVOT Area:     4.91 cm 2D Strain GLS Avg:     -20.1 %  RIGHT VENTRICLE RV S prime:     17.90 cm/s TAPSE (M-mode): 2.6 cm LEFT ATRIUM             Index  RIGHT ATRIUM           Index LA diam:        2.60 cm 1.26 cm/m  RA  Area:     14.60 cm LA Vol (A2C):   47.1 ml 22.91 ml/m RA Volume:   36.80 ml  17.90 ml/m LA Vol (A4C):   29.0 ml 14.11 ml/m LA Biplane Vol: 38.6 ml 18.78 ml/m  AORTIC VALVE LVOT Vmax:   180.00 cm/s LVOT Vmean:  117.000 cm/s LVOT VTI:    0.270 m  AORTA Ao Root diam: 4.10 cm Ao Asc diam:  4.00 cm MITRAL VALVE               TRICUSPID VALVE MV Area (PHT): 2.91 cm    TR Peak grad:   52.7 mmHg MV Decel Time: 261 msec    TR Vmax:        363.00 cm/s MV E velocity: 53.50 cm/s MV A velocity: 76.10 cm/s  SHUNTS MV E/A ratio:  0.70        Systemic VTI:  0.27 m                            Systemic Diam: 2.50 cm Dani Gobble Croitoru MD Electronically signed by Sanda Klein MD Signature Date/Time: 01/24/2021/12:07:29 PM    Final    US THORACENTESIS ASP PLEURAL SPACE W/IMG GUIDE  Result Date: 01/23/2021 INDICATION: History of colon cancer with worsening shortness of breath and large right pleural effusion. Request for diagnostic and therapeutic thoracentesis. EXAM: ULTRASOUND GUIDED RIGHT THORACENTESIS MEDICATIONS: 1% lidocaine 10 mL COMPLICATIONS: None immediate. PROCEDURE: An ultrasound guided thoracentesis was thoroughly discussed with the patient and questions answered. The benefits, risks, alternatives and complications were also discussed. The patient understands and wishes to proceed with the procedure. Written consent was obtained. Ultrasound was performed to localize and mark an adequate pocket of fluid in the right chest. The area was then prepped and draped in the normal sterile fashion. 1% Lidocaine was used for local anesthesia. Under ultrasound guidance a 6 Fr Safe-T-Centesis catheter was introduced. Thoracentesis was performed. The catheter was removed and a dressing applied. FINDINGS: A total of approximately 2.5 L of bloody fluid was removed. Samples were sent to the laboratory as requested by the clinical team. IMPRESSION: Successful ultrasound guided right thoracentesis yielding 2.5 L of pleural fluid. No  pneumothorax on post-procedure chest x-ray. Read by: Gareth Eagle, PA-C Electronically Signed   By: Markus Daft M.D.   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 -Reviewed recommendations as previously outlined by urology. -Follow recommendations per Dr. Chryl Heck.   Acute hypoxic respiratory failure secondary to right pleural effusion -Status post thoracentesis x2. -Cytology pending on pleural fluid. -If malignant, may need to consider placement of Pleurx catheter.  Pericardial effusion without evidence of tamponade -Pericardial effusion noted on CT scan. -Echo performed and shows no evidence of tamponade. -Monitor for now.  Anemia -This is likely due to underlying malignancy as well as CKD. -Monitor hemoglobin closely. -No need for transfusion at this time.  Hypertension -Blood pressure overall well controlled on amlodipine. -Management per hospitalist.  Acute on chronic kidney disease -Monitor renal function closely.  Thank you for this referral.   Mikey Bussing, DNP, AGPCNP-BC, AOCNP  Attending Note  I personally saw the patient, reviewed the chart and examined the patient. The plan of care was discussed with the patient and the admitting team. I agree with the assessment and plan as documented above. Thank you very much for the consultation. I agree that the findings are concerning for metastatic RCC. I discussed this with the patient and discussed that if this is indeed metastatic RCC, intent of treatment is palliative. We will await pathology results from pleural fluid. If negative, we may consider biopsy from a metastatic site to know histology if clear cell or non clear cell. There is a CT chest done at OSH, will benefit if we can obtain images, this can be done  outpatient as well. Once diagnosis is confirmed, given the amount of tumor burden, I would recommend upfront systemic therapy with axitinib and pembrolizumab, mostly because he is a poor risk by MSKCC/IMDC criteria. We do consider nephrectomy upfront at times but predominantly for lung only metastatic disease and small metastatic burden. If he has excellent response to systemic therapy we can consider nephrectomy in the future. He is agreeable to all recommendations.

## 2021-01-25 NOTE — Progress Notes (Addendum)
PROGRESS NOTE                                                                                                                                                                                                             Patient Demographics:    [name], is a 55 y.o. male, DOB - 02-03-1966, HXT:[id number]  Outpatient Primary MD for the patient is Glenda Chroman, MD    LOS - 3  Admit date - 01/22/2021    No chief complaint on file.      Brief Narrative (HPI from H&P) - 55 year old male with past medical history of colon cancer (2015 S/P hemicolectomy), hyperlipidemia, hypertension, he presented with progressive SOB to [facility] emergency department for evaluation, workup showed CT imaging the chest which not only redemonstrated the large right pleural effusion but also identified a large heterogenous mass of the upper pole of the left kidney concerning for renal cell carcinoma with extension into the left renal vein.  Additionally there is evidence of a mild to moderate pericardial effusion, nodal lesions concerning for metastases in the mediastinum as well as bilateral hilar regions in addition to evidence of bilateral pleural mets and moderate pericardial effusion.  He was subsequently transferred to [facility] for further care.   Subjective:   Patient in bed, appears comfortable, denies any headache, no fever, no chest pain or pressure, improved shortness of breath , no abdominal pain. No focal weakness.    Assessment  & Plan :     1. Acute Hypoxic Resp. Failure due to large Right-Sided pleural effusion suspicious for malignant with new diagnosis of left kidney mass suspicious of renal cell cancer and possible mild to moderate pericardial effusion.  He underwent ultrasound-guided therapeutic and diagnostic R. thoracentesis on 01/23/2021 by IR, he had 2.5 L of exudative fluid, cytology pending urology and oncology  consulted and are following.  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  Still has large residual pleural effusion on the right side will order ultrasound-guided therapeutic thoracentesis on 01/25/2021.  If stable likely discharge home in 1 to 2 days with outpatient follow-up with Dr. Alen Blew and Many.    2.  Mild pericardial effusion.  Echocardiogram noted no tamponade.  3. HTN.  On Norvasc.  4. 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          Condition - Extremely Guarded  Family Communication  : Mother [name] [phone]  on 01/23/2021  Code Status : Full  Consults  :  IR, Urology, Oncology  PUD Prophylaxis : None   Procedures  :     TTE -   1. 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 There is no outflow tract obstruction. Left ventricular ejection fraction, by estimation, is >75%. The left ventricle has hyperdynamic function. The left ventricle has no regional wall motion abnormalities. There is moderate asymmetric left ventricular hypertrophy of the apical segment. Left ventricular diastolic parameters are consistent with Grade I diastolic dysfunction (impaired relaxation). The average left ventricular global longitudinal strain is -20.1 %. The global longitudinal strain is normal.   2. Right ventricular systolic function is hyperdynamic. The right ventricular size is normal. There is moderately elevated pulmonary artery systolic pressure.  3. A small pericardial effusion is present. The pericardial effusion is circumferential. There is no evidence of cardiac tamponade.  4. The mitral valve is normal in structure. No evidence of mitral valve regurgitation.  5. Tricuspid valve regurgitation is moderate.  6. The aortic valve is tricuspid. Aortic valve regurgitation is not visualized. No aortic stenosis is present.  7. Aortic dilatation noted. There is mild dilatation of the aortic  root, measuring 41 mm. There is mild dilatation of the ascending aorta, measuring 40 mm.  8. No thrombus/mass is seen in the inferior vena cava. The inferior vena cava is normal in size with greater than 50% respiratory variability, suggesting right atrial pressure of 3 mmHg.   [place] right thoracentesis on 01/23/2021 by IR - 2.5lit exudative fluid removed.  CT - 15.1 cm left upper pole renal mass, compatible with advanced renal cell carcinoma. Expansile tumor thrombus in the left renal vein. Associated left para-aortic nodal metastases. Small hepatic metastases. Bilateral pulmonary metastases, incompletely visualized. Extensive pleural metastases in the right hemithorax with a moderate to large pleural effusion. Thoracic nodal metastases, incompletely visualized      Disposition Plan  :    Status is: Inpatient  Remains inpatient appropriate because:IV treatments appropriate due to intensity of illness or inability to take PO   Dispo: The patient is from: Home              Anticipated d/c is to: Home              Patient currently is not medically stable to d/c.   Difficult to place patient No   DVT Prophylaxis  :   Heparin    Lab Results  Component Value Date   PLT 412 (H) 01/25/2021    Diet :  Diet Order            Diet regular Room service appropriate? Yes; Fluid consistency: Thin  Diet effective now                  Inpatient Medications  Scheduled Meds: . amLODipine  5 mg Oral Daily  . feeding supplement  237 mL Oral TID BM  . heparin injection (subcutaneous)  5,000 Units Subcutaneous Q8H  . mouth rinse  15 mL Mouth Rinse BID  . multivitamin with minerals  1 tablet Oral Daily  . senna-docusate  1 tablet Oral Daily   Continuous Infusions:  PRN Meds:.acetaminophen **OR** [DISCONTINUED] acetaminophen, cyclobenzaprine, hydrALAZINE, loratadine, [DISCONTINUED] ondansetron **OR** ondansetron (ZOFRAN) IV, polyethylene glycol  Antibiotics  :    Anti-infectives (From  admission, onward)  None       Time Spent in minutes  30   Lala Lund M.D on 01/25/2021 at 9:31 AM  To page go to [email or web address]   [facility] -  Office  [phone]    See all Orders from today for further details    Objective:   Vitals:   01/24/21 2028 01/24/21 2113 01/25/21 0350 01/25/21 0800  BP: 135/75 129/76 124/83   Pulse: 100 95 97 94  Resp: (!) 28 18 (!) 22 17  Temp: 98.8 F (37.1 C) 98.2 F (36.8 C) 98.9 F (37.2 C)   TempSrc: Oral Oral Oral   SpO2: 94% 94% 94% 95%  Weight:   83.9 kg   Height:        Wt Readings from Last 3 Encounters:  01/25/21 83.9 kg  12/23/20 88.5 kg  11/09/20 93.1 kg     Intake/Output Summary (Last 24 hours) at 01/25/2021 0931 Last data filed at 01/25/2021 0353 Gross per 24 hour  Intake --  Output 250 ml  Net -250 ml     Physical Exam  Awake Alert, No new F.N deficits, Normal affect [place].AT,PERRAL Supple Neck,No JVD, No cervical lymphadenopathy appriciated.  Symmetrical Chest wall movement, diminished breath sounds at the right base  RRR,No Gallops, Rubs or new Murmurs, No Parasternal Heave +ve B.Sounds, Abd Soft, No tenderness, No organomegaly appriciated, No rebound - guarding or rigidity. No Cyanosis, Clubbing or edema, No new Rash or bruise    Data Review:    CBC Recent Labs  Lab 01/23/21 0048 01/24/21 0211 01/25/21 0223  WBC 10.1 9.3 10.7*  HGB 9.9* 9.5* 9.5*  HCT 30.2* 30.0* 29.7*  PLT 487* 471* 412*  MCV 82.1 83.6 82.7  MCH 26.9 26.5 26.5  MCHC 32.8 31.7 32.0  RDW 14.6 14.6 14.5  LYMPHSABS 1.3 1.4 1.3  MONOABS 0.8 0.8 0.8  EOSABS 0.1 0.2 0.2  BASOSABS 0.1 0.1 0.0    Recent Labs  Lab 01/23/21 0048 01/23/21 0111 01/24/21 0211 01/25/21 0223  NA 138  --  136 135  K 4.7  --  4.5 4.4  CL 105  --  103 104  CO2 23  --  24 25  GLUCOSE 103*  --  101* 116*  BUN 14  --  15 17  CREATININE 1.53*  --  1.55* 1.54*  CALCIUM 8.7*  --  8.6* 8.3*  AST 23  --  21 25  ALT 26  --  25 29   ALKPHOS 45  --  52 55  BILITOT 0.8  --  0.6 0.3  ALBUMIN 2.3*  --  2.1* 2.1*  MG 2.1  --  2.2 2.2  CRP  --  12.9* 15.0* 14.0*  LATICACIDVEN 1.7  --   --   --   INR 1.2  --   --   --   TSH  --  1.965  --   --   BNP 39.7  --  40.0 30.3    ------------------------------------------------------------------------------------------------------------------ No results for input(s): CHOL, HDL, LDLCALC, TRIG, CHOLHDL, LDLDIRECT in the last 72 hours.  No results found for: HGBA1C ------------------------------------------------------------------------------------------------------------------ Recent Labs    01/23/21 0111  TSH 1.965    Cardiac Enzymes No results for input(s): CKMB, TROPONINI, MYOGLOBIN in the last 168 hours.  Invalid input(s): CK ------------------------------------------------------------------------------------------------------------------    Component Value Date/Time   BNP 30.3 01/25/2021 0223    Micro Results Recent Results (from the past 240 hour(s))  Culture, body fluid w Gram Stain-bottle  Status: None (Preliminary result)   Collection Time: 01/23/21 11:21 AM   Specimen: Pleura  Result Value Ref Range Status   Specimen Description PLEURAL  Final   Special Requests FLUID  Final   Culture   Final    NO GROWTH 2 DAYS Performed at [facility], [address], [place], [place] [address]    Report Status PENDING  Incomplete  Gram stain     Status: None   Collection Time: 01/23/21 11:21 AM   Specimen: Pleura  Result Value Ref Range Status   Specimen Description PLEURAL  Final   Special Requests FLUID  Final   Gram Stain   Final    RARE WBC PRESENT, PREDOMINANTLY PMN NO ORGANISMS SEEN Performed at [facility], [place] [address], [place], [place] [address]    Report Status 01/23/2021 FINAL  Final    Radiology Reports CT ABDOMEN PELVIS W WO CONTRAST  Result Date: 01/24/2021 CLINICAL DATA:  Renal mass EXAM: CT ABDOMEN AND PELVIS WITHOUT  AND WITH CONTRAST TECHNIQUE: Multidetector CT imaging of the abdomen and pelvis was performed following the standard protocol before and following the bolus administration of intravenous contrast. CONTRAST:  193mL OMNIPAQUE IOHEXOL 300 MG/ML  SOLN COMPARISON:  None FINDINGS: Lower chest: Moderate to large right pleural effusion with numerous pleural-based metastases throughout the right hemithorax. For example, a 3.2 x 5.6 cm mass abuts the right heart border (series 7/image 21) and a 3.0 x 5.7 cm mass abuts the posterior hemidiaphragm (series 7/image 40). Associated right middle and right lower lobe atelectasis. Small left pleural effusion with lower lobe atelectasis. Multiple small parenchymal and pleural pulmonary nodules bilaterally, incompletely visualized, measuring up to 8 mm in the right middle lobe (series 6/image 8). Thoracic nodal metastases, incompletely visualized, including a dominant 2.7 cm subcarinal node (series 7/image 6). Hepatobiliary: 14 mm metastasis in segment 8 (series 11/image 54). Additional lesions may be present in the central left hepatic lobe (series 11/image 42) and central right hepatic lobe (series 11/image 63), indeterminate. Layering small gallstones (series 11/image 78), without associated inflammatory changes. No intrahepatic or extrahepatic ductal dilatation. Pancreas: Within normal limits. Spleen: Within normal limits. Adrenals/Urinary Tract: Right adrenal gland is within normal limits. Left adrenal gland is displaced anteriorly (series 7/image 58) but likely within normal limits. Right kidney is within normal limits.  No hydronephrosis. Large left upper pole renal mass, measuring at least 13.8 x 10.0 x 15.1 cm (series 7/image 72), extending beyond Gerota's fascia. This is compatible with advanced renal cell carcinoma. No hydronephrosis. Bladder is within normal limits. Stomach/Bowel: Stomach is within normal limits. No evidence of bowel obstruction. Suspected right  hemicolectomy with appendectomy. Vascular/Lymphatic: No evidence of abdominal aortic aneurysm. Atherosclerotic calcifications of the abdominal aorta and branch vessels. Expansile tumor thrombus in the left renal vein (series 7/image 70). Left para-aortic nodal metastases measuring up to 2.3 cm short axis (series 7/image 81). Reproductive: Prostate is unremarkable. Other: Trace pelvic ascites (series 11/image 139). No free air. Musculoskeletal: Old deformity involving the right posterior pelvis. No findings suspicious for lytic metastases. IMPRESSION: 15.1 cm left upper pole renal mass, compatible with advanced renal cell carcinoma. Expansile tumor thrombus in the left renal vein. Associated left para-aortic nodal metastases. Small hepatic metastases. Bilateral pulmonary metastases, incompletely visualized. Extensive pleural metastases in the right hemithorax with a moderate to large pleural effusion. Thoracic nodal metastases, incompletely visualized. Additional ancillary findings as above. Electronically Signed   By: Julian Hy M.D.   On: 01/24/2021  05:33   DG Chest 1 View  Result Date: 01/23/2021 CLINICAL DATA:  RIGHT pleural effusion post thoracentesis EXAM: CHEST  1 VIEW COMPARISON:  Exam at 1104 hours compared to earlier study of 01/01 hours FINDINGS: Enlargement of cardiac silhouette. Decrease in RIGHT pleural effusion since prior exam with aeration of the RIGHT upper lobe now identified. Persistent atelectasis versus consolidation of RIGHT middle and RIGHT lower lobes. LEFT lung clear. No pneumothorax. IMPRESSION: No pneumothorax following RIGHT thoracentesis. Electronically Signed   By: Lavonia Dana M.D.   On: 01/23/2021 13:14   DG Chest 1 View  Result Date: 01/23/2021 CLINICAL DATA:  Evaluate pleural effusion EXAM: CHEST  1 VIEW COMPARISON:  11/27/2017 FINDINGS: Cardiac shadow is stable. Left lung is well aerated. Some fullness in the left suprahilar region is noted. Complete opacification of  the right hemithorax is noted likely related to a large effusion and right lung consolidation. CT would be helpful for further evaluation. IMPRESSION: Complete opacification of the right hemithorax consistent with a large right-sided effusion. CT may be helpful for further evaluation. Mild fullness in the left suprahilar region. This could also be evaluated on CT examination. Electronically Signed   By: Inez Catalina M.D.   On: 01/23/2021 01:09   DG Chest Port 1 View  Result Date: 01/24/2021 CLINICAL DATA:  Shortness of breath, right pleural effusion EXAM: PORTABLE CHEST 1 VIEW COMPARISON:  01/23/2021 FINDINGS: Moderate to large right pleural effusion. Associated right lung opacity, presumably compressive atelectasis, although superimposed pleural-based metastases are evident on CT. Left lung is clear. The heart is normal in size. IMPRESSION: Moderate to large right pleural effusion. Electronically Signed   By: Julian Hy M.D.   On: 01/24/2021 08:22   ECHOCARDIOGRAM COMPLETE  Result Date: 01/24/2021    ECHOCARDIOGRAM REPORT   Patient Name:   [name] Date of Exam: 01/24/2021 Medical Rec #:  [id number]       Height:       72.0 in Accession #:    [id number]      Weight:       183.9 lb Date of Birth:  09-28-1966       BSA:          2.056 m Patient Age:    56 years        BP:           116/57 mmHg Patient Gender: M               HR:           105 bpm. Exam Location:  Inpatient Procedure: 2D Echo, Cardiac Doppler and Color Doppler Indications:    Pericardial effusion I31.3  History:        Patient has no prior history of Echocardiogram examinations.                 Risk Factors:Hypertension.  Sonographer:    Bernadene Person RDCS Referring Phys: [id number] [place]  1. 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 There is no outflow tract obstruction. Left ventricular ejection fraction, by estimation, is >75%. The left ventricle has hyperdynamic  function. The left ventricle has no regional wall motion abnormalities. There is moderate asymmetric left ventricular hypertrophy of the apical segment. Left ventricular diastolic parameters are consistent with Grade I diastolic dysfunction (impaired relaxation). The average left ventricular global longitudinal strain is -20.1 %. The global longitudinal strain is normal.  2. Right ventricular systolic function is hyperdynamic. The right ventricular size  is normal. There is moderately elevated pulmonary artery systolic pressure.  3. A small pericardial effusion is present. The pericardial effusion is circumferential. There is no evidence of cardiac tamponade.  4. The mitral valve is normal in structure. No evidence of mitral valve regurgitation.  5. Tricuspid valve regurgitation is moderate.  6. The aortic valve is tricuspid. Aortic valve regurgitation is not visualized. No aortic stenosis is present.  7. Aortic dilatation noted. There is mild dilatation of the aortic root, measuring 41 mm. There is mild dilatation of the ascending aorta, measuring 40 mm.  8. No thrombus/mass is seen in the inferior vena cava. The inferior vena cava is normal in size with greater than 50% respiratory variability, suggesting right atrial pressure of 3 mmHg. 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 There is no outflow tract obstruction. Left ventricular ejection fraction, by estimation, is >75%. The left ventricle has hyperdynamic function. The left ventricle has no regional wall motion abnormalities. The average left ventricular global longitudinal strain is -20.1 %. The global longitudinal strain is normal. The left ventricular internal cavity size was normal in size. There is moderate asymmetric left ventricular hypertrophy of the apical segment. Left ventricular diastolic parameters are consistent with Grade I diastolic dysfunction (impaired relaxation). Normal left  ventricular filling pressure. Right Ventricle: The right ventricular size is normal. No increase in right ventricular wall thickness. Right ventricular systolic function is hyperdynamic. There is moderately elevated pulmonary artery systolic pressure. The tricuspid regurgitant velocity is 3.63 m/s, and with an assumed right atrial pressure of 3 mmHg, the estimated right ventricular systolic pressure is 37.6 mmHg. Left Atrium: Left atrial size was normal in size. Right Atrium: Right atrial size was normal in size. Pericardium: A small pericardial effusion is present. The pericardial effusion is circumferential. There is no evidence of cardiac tamponade. Mitral Valve: The mitral valve is normal in structure. No evidence of mitral valve regurgitation. Tricuspid Valve: The tricuspid valve is normal in structure. Tricuspid valve regurgitation is moderate. Aortic Valve: The aortic valve is tricuspid. Aortic valve regurgitation is not visualized. No aortic stenosis is present. Pulmonic Valve: The pulmonic valve was normal in structure. Pulmonic valve regurgitation is not visualized. Aorta: Aortic dilatation noted. There is mild dilatation of the aortic root, measuring 41 mm. There is mild dilatation of the ascending aorta, measuring 40 mm. Venous: No thrombus/mass is seen in the inferior vena cava. The inferior vena cava is normal in size with greater than 50% respiratory variability, suggesting right atrial pressure of 3 mmHg. IAS/Shunts: There is redundancy of the interatrial septum. No atrial level shunt detected by color flow Doppler.  LEFT VENTRICLE PLAX 2D LVIDd:         4.20 cm  Diastology LVIDs:         2.30 cm  LV e' medial:    5.55 cm/s LV PW:         1.50 cm  LV E/e' medial:  9.6 LV IVS:        1.50 cm  LV e' lateral:   7.83 cm/s LVOT diam:     2.50 cm  LV E/e' lateral: 6.8 LV SV:         133 LV SV Index:   64       2D Longitudinal Strain LVOT Area:     4.91 cm 2D Strain GLS Avg:     -20.1 %  RIGHT VENTRICLE RV  S prime:     17.90 cm/s TAPSE (  M-mode): 2.6 cm LEFT ATRIUM             Index       RIGHT ATRIUM           Index LA diam:        2.60 cm 1.26 cm/m  RA Area:     14.60 cm LA Vol (A2C):   47.1 ml 22.91 ml/m RA Volume:   36.80 ml  17.90 ml/m LA Vol (A4C):   29.0 ml 14.11 ml/m LA Biplane Vol: 38.6 ml 18.78 ml/m  AORTIC VALVE LVOT Vmax:   180.00 cm/s LVOT Vmean:  117.000 cm/s LVOT VTI:    0.270 m  AORTA Ao Root diam: 4.10 cm Ao Asc diam:  4.00 cm MITRAL VALVE               TRICUSPID VALVE MV Area (PHT): 2.91 cm    TR Peak grad:   52.7 mmHg MV Decel Time: 261 msec    TR Vmax:        363.00 cm/s MV E velocity: 53.50 cm/s MV A velocity: 76.10 cm/s  SHUNTS MV E/A ratio:  0.70        Systemic VTI:  0.27 m                            Systemic Diam: 2.50 cm Dani Gobble Croitoru MD Electronically signed by Sanda Klein MD Signature Date/Time: 01/24/2021/12:07:29 PM    Final    US THORACENTESIS ASP PLEURAL SPACE W/IMG GUIDE  Result Date: 01/23/2021 INDICATION: History of colon cancer with worsening shortness of breath and large right pleural effusion. Request for diagnostic and therapeutic thoracentesis. EXAM: ULTRASOUND GUIDED RIGHT THORACENTESIS MEDICATIONS: 1% lidocaine 10 mL COMPLICATIONS: None immediate. PROCEDURE: An ultrasound guided thoracentesis was thoroughly discussed with the patient and questions answered. The benefits, risks, alternatives and complications were also discussed. The patient understands and wishes to proceed with the procedure. Written consent was obtained. Ultrasound was performed to localize and mark an adequate pocket of fluid in the right chest. The area was then prepped and draped in the normal sterile fashion. 1% Lidocaine was used for local anesthesia. Under ultrasound guidance a 6 Fr Safe-T-Centesis catheter was introduced. Thoracentesis was performed. The catheter was removed and a dressing applied. FINDINGS: A total of approximately 2.5 L of bloody fluid was removed. Samples were sent to  the laboratory as requested by the clinical team. IMPRESSION: Successful ultrasound guided right thoracentesis yielding 2.5 L of pleural fluid. No pneumothorax on post-procedure chest x-ray. Read by: Gareth Eagle, PA-C Electronically Signed   By: Markus Daft M.D.   On: 01/23/2021 12:19

## 2021-01-25 NOTE — Progress Notes (Signed)
Physical Therapy Treatment Patient Details Name: [name] MRN: [id number] DOB: 1966/01/03 Today's Date: 01/25/2021    History of Present Illness Pt is a 55 y.o. M who presented to [facility] ED for evaluation of progressive SOB. 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 Pt transferred to [facility] for further medical management. He underwent R thoracentesis 3/26 and 3/28. PMH of colon cancer (2015 S/P hemicolectomy), hyperlipidemia, and HTN.    PT Comments    Pt assessed s/p thoracentesis; progressing steadily towards his physical therapy goals. Desaturation to 89% on RA at rest; increased to 2-3L during mobility to maintain saturations > 90%. Pt ambulating x 400 feet with no assistive device without physical difficulty. Education provided regarding endurance conservation techniques and activity recommendations. Will continue to follow acutely to promote cardiopulmonary endurance.     Follow Up Recommendations  No PT follow up     Equipment Recommendations  None recommended by PT    Recommendations for Other Services       Precautions / Restrictions Precautions Precautions: Other (comment) Precaution Comments: watch sats Restrictions Weight Bearing Restrictions: No    Mobility  Bed Mobility Overal bed mobility: Modified Independent             General bed mobility comments: HOB elevated    Transfers Overall transfer level: Independent Equipment used: None                Ambulation/Gait Ambulation/Gait assistance: Supervision Gait Distance (Feet): 400 Feet Assistive device: None Gait Pattern/deviations: WFL(Within Functional Limits)         Stairs             Wheelchair Mobility    Modified Rankin (Stroke Patients Only)       Balance Overall balance assessment: No apparent balance  deficits (not formally assessed)                                          Cognition Arousal/Alertness: Awake/alert Behavior During Therapy: WFL for tasks assessed/performed Overall Cognitive Status: Within Functional Limits for tasks assessed                                        Exercises General Exercises - Lower Extremity Long Arc Quad: Both;15 reps;Seated Hip Flexion/Marching: Both;10 reps;Seated    General Comments        Pertinent Vitals/Pain Pain Assessment: No/denies pain    Home Living                      Prior Function            PT Goals (current goals can now be found in the care plan section) Acute Rehab PT Goals Patient Stated Goal: home Potential to Achieve Goals: Good Progress towards PT goals: Progressing toward goals    Frequency    Min 3X/week      PT Plan Current plan remains appropriate    Co-evaluation              AM-PAC PT "6 Clicks" Mobility   Outcome Measure  Help needed turning from your back to your side while in a  flat bed without using bedrails?: None Help needed moving from lying on your back to sitting on the side of a flat bed without using bedrails?: None Help needed moving to and from a bed to a chair (including a wheelchair)?: None Help needed standing up from a chair using your arms (e.g., wheelchair or bedside chair)?: None Help needed to walk in hospital room?: A Little Help needed climbing 3-5 steps with a railing? : A Little 6 Click Score: 22    End of Session Equipment Utilized During Treatment: Oxygen Activity Tolerance: Patient tolerated treatment well Patient left: in bed;with call bell/phone within reach Nurse Communication: Mobility status PT Visit Diagnosis: Difficulty in walking, not elsewhere classified (R26.2)     Time: 2671-2458 PT Time Calculation (min) (ACUTE ONLY): 21 min  Charges:  $Therapeutic Activity: 8-22 mins                      Wyona Almas, PT, DPT Acute Rehabilitation Services Pager [phone] Office [phone]     Deno Etienne 01/25/2021, 4:45 PM

## 2021-01-25 NOTE — Procedures (Addendum)
PROCEDURE SUMMARY:  Successful image-guided right thoracentesis. Yielded 2.0 liters of dark red fluid. Procedure was stopped after 2.0 liters per patient's request secondary to patient's symptoms (chest pain, coughing). Patient tolerated procedure well. No immediate complications. EBL < 1 mL.  Specimen was not sent for labs. CXR ordered.  Please see imaging section of Epic for full dictation.   Claris Pong Kynsie Falkner PA-C 01/25/2021 10:50 AM

## 2021-01-26 ENCOUNTER — Inpatient Hospital Stay (HOSPITAL_COMMUNITY): Payer: BC Managed Care – PPO

## 2021-01-26 ENCOUNTER — Telehealth: Payer: Self-pay | Admitting: Hematology and Oncology

## 2021-01-26 LAB — COMPREHENSIVE METABOLIC PANEL
ALT: 38 U/L (ref 0–44)
AST: 41 U/L (ref 15–41)
Albumin: 2.1 g/dL — ABNORMAL LOW (ref 3.5–5.0)
Alkaline Phosphatase: 54 U/L (ref 38–126)
Anion gap: 10 (ref 5–15)
BUN: 18 mg/dL (ref 6–20)
CO2: 22 mmol/L (ref 22–32)
Calcium: 8.4 mg/dL — ABNORMAL LOW (ref 8.9–10.3)
Chloride: 105 mmol/L (ref 98–111)
Creatinine, Ser: 1.39 mg/dL — ABNORMAL HIGH (ref 0.61–1.24)
GFR, Estimated: 60 mL/min — ABNORMAL LOW (ref >60)
Glucose, Bld: 97 mg/dL (ref 70–99)
Potassium: 4.3 mmol/L (ref 3.5–5.1)
Sodium: 137 mmol/L (ref 135–145)
Total Bilirubin: 0.5 mg/dL (ref 0.3–1.2)
Total Protein: 6 g/dL — ABNORMAL LOW (ref 6.5–8.1)

## 2021-01-26 LAB — CBC WITH DIFFERENTIAL/PLATELET
Abs Immature Granulocytes: 0.05 10*3/uL (ref 0.00–0.07)
Basophils Absolute: 0 10*3/uL (ref 0.0–0.1)
Basophils Relative: 0 %
Eosinophils Absolute: 0.1 10*3/uL (ref 0.0–0.5)
Eosinophils Relative: 2 %
HCT: 29.3 % — ABNORMAL LOW (ref 39.0–52.0)
Hemoglobin: 9.3 g/dL — ABNORMAL LOW (ref 13.0–17.0)
Immature Granulocytes: 1 %
Lymphocytes Relative: 13 %
Lymphs Abs: 1.2 10*3/uL (ref 0.7–4.0)
MCH: 26.7 pg (ref 26.0–34.0)
MCHC: 31.7 g/dL (ref 30.0–36.0)
MCV: 84.2 fL (ref 80.0–100.0)
Monocytes Absolute: 0.7 10*3/uL (ref 0.1–1.0)
Monocytes Relative: 7 %
Neutro Abs: 7.3 10*3/uL (ref 1.7–7.7)
Neutrophils Relative %: 77 %
Platelets: 456 10*3/uL — ABNORMAL HIGH (ref 150–400)
RBC: 3.48 MIL/uL — ABNORMAL LOW (ref 4.22–5.81)
RDW: 14.7 % (ref 11.5–15.5)
WBC: 9.4 10*3/uL (ref 4.0–10.5)
nRBC: 0 % (ref 0.0–0.2)

## 2021-01-26 LAB — MAGNESIUM: Magnesium: 2.1 mg/dL (ref 1.7–2.4)

## 2021-01-26 LAB — BRAIN NATRIURETIC PEPTIDE: B Natriuretic Peptide: 37 pg/mL (ref 0.0–100.0)

## 2021-01-26 LAB — C-REACTIVE PROTEIN: CRP: 12.9 mg/dL — ABNORMAL HIGH (ref <1.0)

## 2021-01-26 LAB — CYTOLOGY - NON PAP

## 2021-01-26 NOTE — Progress Notes (Signed)
Patient discharging home. Vital signs stable at time of discharge as reflected in discharge summary. Discharge instructions given and verbal understanding returned. Patient to follow with pcp and specialist within a week. No questions at this time.

## 2021-01-26 NOTE — Discharge Summary (Signed)
[name] LKG:[id number] DOB: Apr 18, 1966 DOA: 01/22/2021  PCP: Glenda Chroman, MD  Admit date: 01/22/2021  Discharge date: 01/26/2021  Admitted From: Home   Disposition:  Home   Recommendations for Outpatient Follow-up:   Follow up with PCP in 1-2 weeks  PCP Please obtain BMP/CBC, 2 view CXR in 1week,  (see Discharge instructions)   PCP Please follow up on the following pending results: Check BMP, Chest x-ray in 5 to 7days if Pl. Effusion re occurs then consider Pleurx catheter placement or outpatient Pulmonary follow-up.   Home Health: None Equipment/Devices: None  Consultations: Urology, Oncology Discharge Condition: Stable    CODE STATUS: Full    Diet Recommendation: Heart Healthy   Diet Order            Diet - low sodium heart healthy           Diet regular Room service appropriate? Yes; Fluid consistency: Thin  Diet effective now                  CC - SOB   Brief history of present illness from the day of admission and additional interim summary    55 year old male with past medical history of colon cancer(2015 S/P hemicolectomy),hyperlipidemia, hypertension, he presented with progressive SOB to [facility] emergency department for evaluation, workup showed CT imaging the chest which not only redemonstrated the large right pleural effusion but also identified a large heterogenous mass of the upper pole of the left kidney concerning for renal cell carcinoma with extension into the left renal vein. Additionally there is evidence of a mild to moderate pericardial effusion, nodal lesions concerning for metastases in the mediastinum as well as bilateral hilar regions in addition to evidence of bilateral pleural mets and moderate pericardial effusion.  He was subsequently transferred to [facility]  for further care.                                                                 Hospital Course   1. Acute Hypoxic Resp. Failure due to large Right-Sided pleural effusion suspicious for malignant with new diagnosis of left kidney mass suspicious of renal cell cancer and possible mild to moderate pericardial effusion.  He underwent ultrasound-guided therapeutic and diagnostic R. thoracentesis on 01/23/2021 by IR, he had 2.5 L of exudative fluid, cytology pending, seen by urology and oncology.  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  Still had large residual pleural effusion on the right side and had 2nd ultrasound-guided therapeutic thoracentesis on 01/25/2021 with another 2 lits of bloody fluid removed. Now stable on RA and symptom free, will DC home with outpatient follow-up with Oncology and Urology, if  Pl. Effusion re occurs then consider Pleurx catheter placement or outpatient Pulmonary follow-up.     2.  Mild pericardial effusion.  Echocardiogram noted no tamponade.  3. HTN.  On Norvasc.  4. 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      Discharge diagnosis     Principal Problem:   Acute respiratory failure with hypoxia (HCC) Active Problems:   Pleural effusion on right   Essential hypertension   Metastatic renal cell carcinoma, left (HCC)   Pericardial effusion    Discharge instructions    Discharge Instructions    Diet - low sodium heart healthy   Complete by: As directed    Discharge instructions   Complete by: As directed    Follow with Primary MD Jerene Bears B, MD in 7 days   Get CBC, CMP, 2 view Chest X ray -  checked next visit within 1 week by Primary MD    Activity: As tolerated with Full fall precautions use walker/cane & assistance as needed  Disposition Home   Diet: Heart Healthy   Special Instructions: If you have smoked or chewed Tobacco  in the last 2 yrs please stop  smoking, stop any regular Alcohol  and or any Recreational drug use.  On your next visit with your primary care physician please Get Medicines reviewed and adjusted.  Please request your Prim.MD to go over all Hospital Tests and Procedure/Radiological results at the follow up, please get all Hospital records sent to your Prim MD by signing hospital release before you go home.  If you experience worsening of your admission symptoms, develop shortness of breath, life threatening emergency, suicidal or homicidal thoughts you must seek medical attention immediately by calling 911 or calling your MD immediately  if symptoms less severe.  You Must read complete instructions/literature along with all the possible adverse reactions/side effects for all the Medicines you take and that have been prescribed to you. Take any new Medicines after you have completely understood and accpet all the possible adverse reactions/side effects.   Increase activity slowly   Complete by: As directed       Discharge Medications   Allergies as of 01/26/2021   No Known Allergies     Medication List    TAKE these medications   albuterol 108 (90 Base) MCG/ACT inhaler Commonly known as: VENTOLIN HFA Inhale 2 puffs into the lungs every 4 (four) hours as needed.   amLODipine 5 MG tablet Commonly known as: NORVASC Take 5 mg by mouth daily.        Follow-up Information    Vyas, Dhruv B, MD. Schedule an appointment as soon as possible for a visit in 1 week(s).   Specialty: Internal Medicine Contact information: [place] [address] [phone]        Arnoldo Lenis, MD .   Specialty: Cardiology Contact information: [address] [place] [address] [phone]        Alexis Frock, MD. Schedule an appointment as soon as possible for a visit in 1 week(s).   Specialty: Urology Why: Renal cancer Contact information: [place] [address] [phone]         Benay Pike, MD. Schedule an appointment as soon as possible for a visit in 1 week(s).   Specialty: Hematology and Oncology Contact information: [place] [address] [phone]               Major procedures and Radiology Reports - PLEASE review detailed and final reports thoroughly  -  CT ABDOMEN PELVIS W WO CONTRAST  Result Date: 01/24/2021 CLINICAL DATA:  Renal mass EXAM: CT ABDOMEN AND PELVIS WITHOUT AND WITH CONTRAST TECHNIQUE: Multidetector CT imaging of the abdomen and pelvis was performed following the standard protocol before and following the bolus administration of intravenous contrast. CONTRAST:  129mL OMNIPAQUE IOHEXOL 300 MG/ML  SOLN COMPARISON:  None FINDINGS: Lower chest: Moderate to large right pleural effusion with numerous pleural-based metastases throughout the right hemithorax. For example, a 3.2 x 5.6 cm mass abuts the right heart border (series 7/image 21) and a 3.0 x 5.7 cm mass abuts the posterior hemidiaphragm (series 7/image 40). Associated right middle and right lower lobe atelectasis. Small left pleural effusion with lower lobe atelectasis. Multiple small parenchymal and pleural pulmonary nodules bilaterally, incompletely visualized, measuring up to 8 mm in the right middle lobe (series 6/image 8). Thoracic nodal metastases, incompletely visualized, including a dominant 2.7 cm subcarinal node (series 7/image 6). Hepatobiliary: 14 mm metastasis in segment 8 (series 11/image 54). Additional lesions may be present in the central left hepatic lobe (series 11/image 42) and central right hepatic lobe (series 11/image 63), indeterminate. Layering small gallstones (series 11/image 78), without associated inflammatory changes. No intrahepatic or extrahepatic ductal dilatation. Pancreas: Within normal limits. Spleen: Within normal limits. Adrenals/Urinary Tract: Right adrenal gland is within normal limits. Left adrenal gland is displaced anteriorly  (series 7/image 58) but likely within normal limits. Right kidney is within normal limits.  No hydronephrosis. Large left upper pole renal mass, measuring at least 13.8 x 10.0 x 15.1 cm (series 7/image 72), extending beyond Gerota's fascia. This is compatible with advanced renal cell carcinoma. No hydronephrosis. Bladder is within normal limits. Stomach/Bowel: Stomach is within normal limits. No evidence of bowel obstruction. Suspected right hemicolectomy with appendectomy. Vascular/Lymphatic: No evidence of abdominal aortic aneurysm. Atherosclerotic calcifications of the abdominal aorta and branch vessels. Expansile tumor thrombus in the left renal vein (series 7/image 70). Left para-aortic nodal metastases measuring up to 2.3 cm short axis (series 7/image 81). Reproductive: Prostate is unremarkable. Other: Trace pelvic ascites (series 11/image 139). No free air. Musculoskeletal: Old deformity involving the right posterior pelvis. No findings suspicious for lytic metastases. IMPRESSION: 15.1 cm left upper pole renal mass, compatible with advanced renal cell carcinoma. Expansile tumor thrombus in the left renal vein. Associated left para-aortic nodal metastases. Small hepatic metastases. Bilateral pulmonary metastases, incompletely visualized. Extensive pleural metastases in the right hemithorax with a moderate to large pleural effusion. Thoracic nodal metastases, incompletely visualized. Additional ancillary findings as above. Electronically Signed   By: Julian Hy M.D.   On: 01/24/2021 05:33   DG Chest 1 View  Result Date: 01/25/2021 CLINICAL DATA:  Status post thoracentesis EXAM: CHEST  1 VIEW COMPARISON:  01/24/2021 FINDINGS: Small right pleural effusion with interval decreased compared with 01/24/2021. Right basilar atelectasis. Trace left pleural effusion. No pneumothorax. Stable cardiomediastinal silhouette. No aggressive osseous lesion. IMPRESSION: 1. Small right pleural effusion with interval  decreased compared with 01/24/2021. No pneumothorax. Electronically Signed   By: Kathreen Devoid   On: 01/25/2021 11:10   DG Chest 1 View  Result Date: 01/23/2021 CLINICAL DATA:  RIGHT pleural effusion post thoracentesis EXAM: CHEST  1 VIEW COMPARISON:  Exam at 1104 hours compared to earlier study of 01/01 hours FINDINGS: Enlargement of cardiac silhouette. Decrease in RIGHT pleural effusion since prior exam with aeration of the RIGHT upper lobe now identified. Persistent atelectasis versus consolidation of RIGHT middle and RIGHT lower lobes. LEFT lung clear. No pneumothorax. IMPRESSION:  No pneumothorax following RIGHT thoracentesis. Electronically Signed   By: Lavonia Dana M.D.   On: 01/23/2021 13:14   DG Chest 1 View  Result Date: 01/23/2021 CLINICAL DATA:  Evaluate pleural effusion EXAM: CHEST  1 VIEW COMPARISON:  11/27/2017 FINDINGS: Cardiac shadow is stable. Left lung is well aerated. Some fullness in the left suprahilar region is noted. Complete opacification of the right hemithorax is noted likely related to a large effusion and right lung consolidation. CT would be helpful for further evaluation. IMPRESSION: Complete opacification of the right hemithorax consistent with a large right-sided effusion. CT may be helpful for further evaluation. Mild fullness in the left suprahilar region. This could also be evaluated on CT examination. Electronically Signed   By: Inez Catalina M.D.   On: 01/23/2021 01:09   DG Chest Port 1 View  Result Date: 01/26/2021 CLINICAL DATA:  Shortness of breath. EXAM: PORTABLE CHEST 1 VIEW COMPARISON:  January 25, 2021. FINDINGS: Stable cardiomediastinal silhouette. No pneumothorax is noted. Stable right pleural effusion is noted with associated atelectasis or infiltrate. Bony thorax is unremarkable. IMPRESSION: Stable right pleural effusion with associated atelectasis or infiltrate. Electronically Signed   By: Marijo Conception M.D.   On: 01/26/2021 08:21   DG Chest Port 1  View  Result Date: 01/24/2021 CLINICAL DATA:  Shortness of breath, right pleural effusion EXAM: PORTABLE CHEST 1 VIEW COMPARISON:  01/23/2021 FINDINGS: Moderate to large right pleural effusion. Associated right lung opacity, presumably compressive atelectasis, although superimposed pleural-based metastases are evident on CT. Left lung is clear. The heart is normal in size. IMPRESSION: Moderate to large right pleural effusion. Electronically Signed   By: Julian Hy M.D.   On: 01/24/2021 08:22   ECHOCARDIOGRAM COMPLETE  Result Date: 01/24/2021    ECHOCARDIOGRAM REPORT   Patient Name:   [name] Date of Exam: 01/24/2021 Medical Rec #:  [id number]       Height:       72.0 in Accession #:    [id number]      Weight:       183.9 lb Date of Birth:  Sep 25, 1966       BSA:          2.056 m Patient Age:    79 years        BP:           116/57 mmHg Patient Gender: M               HR:           105 bpm. Exam Location:  Inpatient Procedure: 2D Echo, Cardiac Doppler and Color Doppler Indications:    Pericardial effusion I31.3  History:        Patient has no prior history of Echocardiogram examinations.                 Risk Factors:Hypertension.  Sonographer:    Bernadene Person RDCS Referring Phys: [id number] [place]  1. 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 There is no outflow tract obstruction. Left ventricular ejection fraction, by estimation, is >75%. The left ventricle has hyperdynamic function. The left ventricle has no regional wall motion abnormalities. There is moderate asymmetric left ventricular hypertrophy of the apical segment. Left ventricular diastolic parameters are consistent with Grade I diastolic dysfunction (impaired relaxation). The average left ventricular global longitudinal strain is -20.1 %. The global longitudinal strain is normal.  2. Right ventricular systolic function is hyperdynamic. The right ventricular size is normal.  There is moderately elevated pulmonary artery systolic pressure.  3. A small pericardial effusion is present. The pericardial effusion is circumferential. There is no evidence of cardiac tamponade.  4. The mitral valve is normal in structure. No evidence of mitral valve regurgitation.  5. Tricuspid valve regurgitation is moderate.  6. The aortic valve is tricuspid. Aortic valve regurgitation is not visualized. No aortic stenosis is present.  7. Aortic dilatation noted. There is mild dilatation of the aortic root, measuring 41 mm. There is mild dilatation of the ascending aorta, measuring 40 mm.  8. No thrombus/mass is seen in the inferior vena cava. The inferior vena cava is normal in size with greater than 50% respiratory variability, suggesting right atrial pressure of 3 mmHg. 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 There is no outflow tract obstruction. Left ventricular ejection fraction, by estimation, is >75%. The left ventricle has hyperdynamic function. The left ventricle has no regional wall motion abnormalities. The average left ventricular global longitudinal strain is -20.1 %. The global longitudinal strain is normal. The left ventricular internal cavity size was normal in size. There is moderate asymmetric left ventricular hypertrophy of the apical segment. Left ventricular diastolic parameters are consistent with Grade I diastolic dysfunction (impaired relaxation). Normal left ventricular filling pressure. Right Ventricle: The right ventricular size is normal. No increase in right ventricular wall thickness. Right ventricular systolic function is hyperdynamic. There is moderately elevated pulmonary artery systolic pressure. The tricuspid regurgitant velocity is 3.63 m/s, and with an assumed right atrial pressure of 3 mmHg, the estimated right ventricular systolic pressure is 77.8 mmHg. Left Atrium: Left atrial size was normal in size.  Right Atrium: Right atrial size was normal in size. Pericardium: A small pericardial effusion is present. The pericardial effusion is circumferential. There is no evidence of cardiac tamponade. Mitral Valve: The mitral valve is normal in structure. No evidence of mitral valve regurgitation. Tricuspid Valve: The tricuspid valve is normal in structure. Tricuspid valve regurgitation is moderate. Aortic Valve: The aortic valve is tricuspid. Aortic valve regurgitation is not visualized. No aortic stenosis is present. Pulmonic Valve: The pulmonic valve was normal in structure. Pulmonic valve regurgitation is not visualized. Aorta: Aortic dilatation noted. There is mild dilatation of the aortic root, measuring 41 mm. There is mild dilatation of the ascending aorta, measuring 40 mm. Venous: No thrombus/mass is seen in the inferior vena cava. The inferior vena cava is normal in size with greater than 50% respiratory variability, suggesting right atrial pressure of 3 mmHg. IAS/Shunts: There is redundancy of the interatrial septum. No atrial level shunt detected by color flow Doppler.  LEFT VENTRICLE PLAX 2D LVIDd:         4.20 cm  Diastology LVIDs:         2.30 cm  LV e' medial:    5.55 cm/s LV PW:         1.50 cm  LV E/e' medial:  9.6 LV IVS:        1.50 cm  LV e' lateral:   7.83 cm/s LVOT diam:     2.50 cm  LV E/e' lateral: 6.8 LV SV:         133 LV SV Index:   64       2D Longitudinal Strain LVOT Area:     4.91 cm 2D Strain GLS Avg:     -20.1 %  RIGHT VENTRICLE RV S prime:     17.90 cm/s TAPSE (M-mode): 2.6 cm  LEFT ATRIUM             Index       RIGHT ATRIUM           Index LA diam:        2.60 cm 1.26 cm/m  RA Area:     14.60 cm LA Vol (A2C):   47.1 ml 22.91 ml/m RA Volume:   36.80 ml  17.90 ml/m LA Vol (A4C):   29.0 ml 14.11 ml/m LA Biplane Vol: 38.6 ml 18.78 ml/m  AORTIC VALVE LVOT Vmax:   180.00 cm/s LVOT Vmean:  117.000 cm/s LVOT VTI:    0.270 m  AORTA Ao Root diam: 4.10 cm Ao Asc diam:  4.00 cm MITRAL VALVE                TRICUSPID VALVE MV Area (PHT): 2.91 cm    TR Peak grad:   52.7 mmHg MV Decel Time: 261 msec    TR Vmax:        363.00 cm/s MV E velocity: 53.50 cm/s MV A velocity: 76.10 cm/s  SHUNTS MV E/A ratio:  0.70        Systemic VTI:  0.27 m                            Systemic Diam: 2.50 cm Dani Gobble Croitoru MD Electronically signed by Sanda Klein MD Signature Date/Time: 01/24/2021/12:07:29 PM    Final    IR THORACENTESIS ASP PLEURAL SPACE W/IMG GUIDE  Result Date: 01/25/2021 INDICATION: Patient with history of colon cancer, dyspnea, and recurrent right pleural effusion. Request is made for therapeutic right thoracentesis. EXAM: ULTRASOUND GUIDED THERAPEUTIC RIGHT THORACENTESIS MEDICATIONS: 10 mL 1% lidocaine COMPLICATIONS: None immediate. PROCEDURE: An ultrasound guided thoracentesis was thoroughly discussed with the patient and questions answered. The benefits, risks, alternatives and complications were also discussed. The patient understands and wishes to proceed with the procedure. Written consent was obtained. Ultrasound was performed to localize and mark an adequate pocket of fluid in the right chest. The area was then prepped and draped in the normal sterile fashion. 1% Lidocaine was used for local anesthesia. Under ultrasound guidance a 6 Fr Safe-T-Centesis catheter was introduced. Thoracentesis was performed. The catheter was removed and a dressing applied. FINDINGS: A total of approximately 2 L of dark red fluid was removed. Procedure was stopped after 2 L per patient request secondary to patient's symptoms (chest pain, coughing). IMPRESSION: Successful ultrasound guided right thoracentesis yielding 2 L of pleural fluid. Read by: Earley Abide, PA-C Electronically Signed   By: Miachel Roux M.D.   On: 01/25/2021 11:21   US THORACENTESIS ASP PLEURAL SPACE W/IMG GUIDE  Result Date: 01/23/2021 INDICATION: History of colon cancer with worsening shortness of breath and large right pleural effusion.  Request for diagnostic and therapeutic thoracentesis. EXAM: ULTRASOUND GUIDED RIGHT THORACENTESIS MEDICATIONS: 1% lidocaine 10 mL COMPLICATIONS: None immediate. PROCEDURE: An ultrasound guided thoracentesis was thoroughly discussed with the patient and questions answered. The benefits, risks, alternatives and complications were also discussed. The patient understands and wishes to proceed with the procedure. Written consent was obtained. Ultrasound was performed to localize and mark an adequate pocket of fluid in the right chest. The area was then prepped and draped in the normal sterile fashion. 1% Lidocaine was used for local anesthesia. Under ultrasound guidance a 6 Fr Safe-T-Centesis catheter was introduced. Thoracentesis was performed. The catheter was removed and a dressing applied. FINDINGS: A total of  approximately 2.5 L of bloody fluid was removed. Samples were sent to the laboratory as requested by the clinical team. IMPRESSION: Successful ultrasound guided right thoracentesis yielding 2.5 L of pleural fluid. No pneumothorax on post-procedure chest x-ray. Read by: Gareth Eagle, PA-C Electronically Signed   By: Markus Daft M.D.   On: 01/23/2021 12:19    Micro Results     Recent Results (from the past 240 hour(s))  Culture, body fluid w Gram Stain-bottle     Status: None (Preliminary result)   Collection Time: 01/23/21 11:21 AM   Specimen: Pleura  Result Value Ref Range Status   Specimen Description PLEURAL  Final   Special Requests FLUID  Final   Culture   Final    NO GROWTH 3 DAYS Performed at [facility], [address] [place], [place] [address]    Report Status PENDING  Incomplete  Gram stain     Status: None   Collection Time: 01/23/21 11:21 AM   Specimen: Pleura  Result Value Ref Range Status   Specimen Description PLEURAL  Final   Special Requests FLUID  Final   Gram Stain   Final    RARE WBC PRESENT, PREDOMINANTLY PMN NO ORGANISMS SEEN Performed at [facility], [place] [address], [place], [place] [address]    Report Status 01/23/2021 FINAL  Final    Today   Subjective    Taha Weekly today has no headache,no chest abdominal pain,no new weakness tingling or numbness, feels much better wants to go home today.    Objective   Blood pressure 128/69, pulse 92, temperature 98.7 F (37.1 C), resp. rate 17, height 6' (1.829 m), weight 83.9 kg, SpO2 92 %.   Intake/Output Summary (Last 24 hours) at 01/26/2021 1038 Last data filed at 01/25/2021 2000 Gross per 24 hour  Intake 240 ml  Output 2000 ml  Net -1760 ml    Exam  Awake Alert, No new F.N deficits, Normal affect [place].AT,PERRAL Supple Neck,No JVD, No cervical lymphadenopathy appriciated.  Symmetrical Chest wall movement, Good air movement bilaterally, CTAB RRR,No Gallops,Rubs or new Murmurs, No Parasternal Heave +ve B.Sounds, Abd Soft, Non tender, No organomegaly appriciated, No rebound -guarding or rigidity. No Cyanosis, Clubbing or edema, No new Rash or bruise   Data Review   CBC w Diff:  Lab Results  Component Value Date   WBC 9.4 01/26/2021   HGB 9.3 (L) 01/26/2021   HCT 29.3 (L) 01/26/2021   PLT 456 (H) 01/26/2021   LYMPHOPCT 13 01/26/2021   MONOPCT 7 01/26/2021   EOSPCT 2 01/26/2021   BASOPCT 0 01/26/2021    CMP:  Lab Results  Component Value Date   NA 137 01/26/2021   K 4.3 01/26/2021   CL 105 01/26/2021   CO2 22 01/26/2021   BUN 18 01/26/2021   CREATININE 1.39 (H) 01/26/2021   PROT 6.0 (L) 01/26/2021   ALBUMIN 2.1 (L) 01/26/2021   BILITOT 0.5 01/26/2021   ALKPHOS 54 01/26/2021   AST 41 01/26/2021   ALT 38 01/26/2021  .   Total Time in preparing paper work, data evaluation and todays exam - 43 minutes  Lala Lund M.D on 01/26/2021 at 10:38 AM  [facility]

## 2021-01-26 NOTE — Telephone Encounter (Signed)
Scheduled appt per 3/29 sch msg. Called pt, no answer. Left msg with appt date and time.

## 2021-01-26 NOTE — Progress Notes (Signed)
SATURATION QUALIFICATIONS: (This note is used to comply with regulatory documentation for home oxygen)  Patient Saturations on Room Air at Rest = 95%  Patient Saturations on Room Air while Ambulating = 92%   

## 2021-01-26 NOTE — Discharge Instructions (Signed)
Follow with Primary MD Jerene Bears B, MD in 7 days   Get CBC, CMP, 2 view Chest X ray -  checked next visit within 1 week by Primary MD    Activity: As tolerated with Full fall precautions use walker/cane & assistance as needed  Disposition Home   Diet: Heart Healthy   Special Instructions: If you have smoked or chewed Tobacco  in the last 2 yrs please stop smoking, stop any regular Alcohol  and or any Recreational drug use.  On your next visit with your primary care physician please Get Medicines reviewed and adjusted.  Please request your Prim.MD to go over all Hospital Tests and Procedure/Radiological results at the follow up, please get all Hospital records sent to your Prim MD by signing hospital release before you go home.  If you experience worsening of your admission symptoms, develop shortness of breath, life threatening emergency, suicidal or homicidal thoughts you must seek medical attention immediately by calling 911 or calling your MD immediately  if symptoms less severe.  You Must read complete instructions/literature along with all the possible adverse reactions/side effects for all the Medicines you take and that have been prescribed to you. Take any new Medicines after you have completely understood and accpet all the possible adverse reactions/side effects.

## 2021-01-27 ENCOUNTER — Telehealth: Payer: Self-pay

## 2021-01-27 NOTE — Telephone Encounter (Signed)
Returned patient's call and provided information regarding appointment time and address. Patient confirmed date and time and had no further questions or concerns. Patient knows to arrive 15 minutes prior to scheduled appointment time.

## 2021-01-28 ENCOUNTER — Ambulatory Visit: Payer: BC Managed Care – PPO | Admitting: Hematology and Oncology

## 2021-01-28 ENCOUNTER — Emergency Department (HOSPITAL_COMMUNITY): Payer: BC Managed Care – PPO

## 2021-01-28 ENCOUNTER — Encounter: Payer: Self-pay | Admitting: Hematology and Oncology

## 2021-01-28 ENCOUNTER — Encounter (HOSPITAL_COMMUNITY): Payer: Self-pay

## 2021-01-28 ENCOUNTER — Inpatient Hospital Stay: Payer: BC Managed Care – PPO | Attending: Hematology and Oncology | Admitting: Hematology and Oncology

## 2021-01-28 ENCOUNTER — Emergency Department (HOSPITAL_COMMUNITY)
Admission: EM | Admit: 2021-01-28 | Discharge: 2021-01-28 | Disposition: A | Discharge status: Home / Self Care | Payer: BC Managed Care – PPO | Source: Home / Self Care | Attending: Emergency Medicine | Admitting: Emergency Medicine

## 2021-01-28 ENCOUNTER — Other Ambulatory Visit: Payer: Self-pay

## 2021-01-28 VITALS — BP 136/87 | HR 104 | Temp 98.6°F | Resp 20 | Ht 72.0 in | Wt 182.7 lb

## 2021-01-28 DIAGNOSIS — C782 Secondary malignant neoplasm of pleura: Secondary | ICD-10-CM | POA: Diagnosis not present

## 2021-01-28 DIAGNOSIS — I10 Essential (primary) hypertension: Secondary | ICD-10-CM | POA: Insufficient documentation

## 2021-01-28 DIAGNOSIS — C778 Secondary and unspecified malignant neoplasm of lymph nodes of multiple regions: Secondary | ICD-10-CM | POA: Insufficient documentation

## 2021-01-28 DIAGNOSIS — E785 Hyperlipidemia, unspecified: Secondary | ICD-10-CM | POA: Insufficient documentation

## 2021-01-28 DIAGNOSIS — C787 Secondary malignant neoplasm of liver and intrahepatic bile duct: Secondary | ICD-10-CM | POA: Diagnosis not present

## 2021-01-28 DIAGNOSIS — Z87891 Personal history of nicotine dependence: Secondary | ICD-10-CM | POA: Insufficient documentation

## 2021-01-28 DIAGNOSIS — Z9049 Acquired absence of other specified parts of digestive tract: Secondary | ICD-10-CM | POA: Insufficient documentation

## 2021-01-28 DIAGNOSIS — I313 Pericardial effusion (noninflammatory): Secondary | ICD-10-CM | POA: Diagnosis not present

## 2021-01-28 DIAGNOSIS — C7802 Secondary malignant neoplasm of left lung: Secondary | ICD-10-CM | POA: Insufficient documentation

## 2021-01-28 DIAGNOSIS — Z85038 Personal history of other malignant neoplasm of large intestine: Secondary | ICD-10-CM | POA: Insufficient documentation

## 2021-01-28 DIAGNOSIS — J90 Pleural effusion, not elsewhere classified: Secondary | ICD-10-CM | POA: Insufficient documentation

## 2021-01-28 DIAGNOSIS — C642 Malignant neoplasm of left kidney, except renal pelvis: Secondary | ICD-10-CM | POA: Diagnosis not present

## 2021-01-28 DIAGNOSIS — R634 Abnormal weight loss: Secondary | ICD-10-CM | POA: Insufficient documentation

## 2021-01-28 DIAGNOSIS — J910 Malignant pleural effusion: Secondary | ICD-10-CM | POA: Diagnosis present

## 2021-01-28 DIAGNOSIS — Z808 Family history of malignant neoplasm of other organs or systems: Secondary | ICD-10-CM | POA: Diagnosis not present

## 2021-01-28 DIAGNOSIS — Z79899 Other long term (current) drug therapy: Secondary | ICD-10-CM | POA: Insufficient documentation

## 2021-01-28 DIAGNOSIS — C7801 Secondary malignant neoplasm of right lung: Secondary | ICD-10-CM | POA: Insufficient documentation

## 2021-01-28 DIAGNOSIS — C649 Malignant neoplasm of unspecified kidney, except renal pelvis: Secondary | ICD-10-CM | POA: Insufficient documentation

## 2021-01-28 DIAGNOSIS — C799 Secondary malignant neoplasm of unspecified site: Secondary | ICD-10-CM

## 2021-01-28 LAB — CULTURE, BODY FLUID W GRAM STAIN -BOTTLE: Culture: NO GROWTH

## 2021-01-28 MED ORDER — ONDANSETRON HCL 8 MG PO TABS: 8.0000 mg | ORAL_TABLET | Freq: Three times a day (TID) | ORAL | 3 refills | Status: DC | PRN

## 2021-01-28 NOTE — Assessment & Plan Note (Addendum)
This is a very pleasant 55 year old male patient with past medical history significant for colon cancer in 2015 status post hemicolectomy, hypertension, dyslipidemia who was admitted with chief complaint of worsening shortness of breath, unintentional weight loss from an outside hospital.  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 His pleural fluid cytology is negative although it is consistent with an exudative effusion.  Given his past history of colon cancer, I recommended biopsy of one of the metastatic sites, personally spoke to interventional radiology and they suggested biopsy of the pleural nodule.  I have placed order for CT-guided biopsy and engaged our nurse navigator to assist [place] with expediting this procedure. 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 Have discussed previously about the intent of treatment which is palliative in the metastatic nature of the disease.  We have previously discussed mechanism of action, adverse effects from this treatment, common side effects being diarrhea and high blood pressure.  He understands that 1 to 2% of patients can have serious fatal adverse effects from this treatment

## 2021-01-28 NOTE — Discharge Instructions (Signed)
Follow-up with your oncologist.  Schedule appointment with the thoracic surgeons to discuss the catheter procedure

## 2021-01-28 NOTE — Progress Notes (Signed)
[place] CONSULT NOTE  Patient Care Team: Glenda Chroman, MD as PCP - General (Internal Medicine) Arnoldo Lenis, MD as PCP - Cardiology (Cardiology)  CHIEF COMPLAINTS/PURPOSE OF CONSULTATION:  SOB  ASSESSMENT & PLAN:  Metastatic carcinoma [facility]) This is a very pleasant 55 year old male patient with past medical history significant for colon cancer in 2015 status post hemicolectomy, hypertension, dyslipidemia who was admitted with chief complaint of worsening shortness of breath, unintentional weight loss from an outside hospital.  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 His pleural fluid cytology is negative although it is consistent with an exudative effusion.  Given his past history of colon cancer, I recommended biopsy of one of the metastatic sites, personally spoke to interventional radiology and they suggested biopsy of the pleural nodule.  I have placed order for CT-guided biopsy and engaged our nurse navigator to assist [place] with expediting this procedure. 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 Have discussed previously about the intent of treatment which is palliative in the metastatic nature of the disease.  We have previously discussed mechanism of action, adverse effects from this treatment, common side effects being diarrhea and high blood pressure.  He understands that 1 to 2% of patients can have serious fatal adverse effects from this  treatment  Pleural effusion, malignant Large right pleural effusion highly suspicious for malignant pleural effusion Cytology negative however the sensitivity only approaches 65% for the first tap. If he is going to have the procedure today, I recommend sending it back to cytology. It also appears that he may need thoracentesis every 2 or 3 days which is practically very hard to schedule outpatient.  We have briefly discussed about Pleurx catheter especially since some of the Pleurx catheters can be removed later. He may benefit symptomatically from a Pleurx catheter because immunotherapy may take several weeks to work.  Unintentional weight loss of more than 10 pounds Patient states he almost lost around 40 pounds in the past 1-1 and half month because he has no appetite and he feels too tired. This is likely because of uncontrolled malignancy.  This may improve with treatment.  We will also consider nutrition referral once he is more stable.  Orders Placed This Encounter  Procedures  . CT BIOPSY    Standing Status:   Future    Standing Expiration Date:   01/28/2022    Order Specific Question:   Lab orders requested (DO NOT place separate lab orders, these will be automatically ordered during procedure specimen collection):    Answer:   Surgical Pathology    Order Specific Question:   Reason for Exam (SYMPTOM  OR DIAGNOSIS REQUIRED)    Answer:   Pleural nodule biopsy, suspect metastatic renal cell carcinoma.    Order Specific Question:   Preferred imaging location?    Answer:   [facility]    Order Specific Question:   Radiology Contrast Protocol - do NOT remove file path    Answer:   [email or web address]  . US Thoracentesis Asp Pleural space w/IMG guide  Standing Status:   Standing    Number of Occurrences:   30    Standing Expiration Date:   01/28/2022    Order Specific Question:   Are labs required for specimen collection?    Answer:   No    Order  Specific Question:   Reason for Exam (SYMPTOM  OR DIAGNOSIS REQUIRED)    Answer:   Malignant pleural effusion, treatment may take time to work.    Order Specific Question:   Preferred imaging location?    Answer:   [facility]     HISTORY OF PRESENTING ILLNESS:   [name] 55 y.o. male is here because of concern for metastatic renal cell carcinoma.  [name] is a 55 year old male with a past medical history significant for colon cancer (status post hemicolectomy in 2015 -previously followed by Dr. Jacquiline Doe at [place]), hyperlipidemia, hypertension.  The patient was transferred from [facility] emergency department to [facility] due to acute hypoxic respiratory failure.  The patient has a 3-week history of cough which progressively worsened.  He subsequently developed shortness of breath. He has a recent 15-20 pound unintentional weight loss in the past few weeks and decreased oral intake due to lack of appetite and generalized weakness.  CT chest with contrast was performed on 01/22/2021 at [facility] and reviewed through care everywhere.  This showed a large solid heterogeneous mass arising off the upper pole of the left kidney consistent with primary renal cell carcinoma, tumor extension into the left renal vein identified, left retroperitoneal metastatic adenopathy, extensive nodal metastasis within the mediastinum and bilateral hilar regions, bilateral pleural metastasis with large malignant right pleural effusion, multiple pulmonary nodules identified compatible with pulmonary metastasis, small to moderate pericardial effusion.  The patient had a CT abdomen/pelvis performed 01/23/2021 which showed a 15.1 cm left upper pole renal mass compatible with advanced renal cell carcinoma, expansile tumor thrombus the left renal vein, so see the left para-aortic nodal metastases, small hepatic metastases, bilateral pulmonary metastases, extensive pleural metastases in the right  hemithorax with moderate to large pleural effusion, thoracic nodal metastases.  The patient had ultrasound-guided thoracentesis performed 01/23/2021 with 2.5 L of fluid removed and a repeat thoracentesis performed earlier today with 2 L of fluid removed. Fluid from thoracentesis negative for malignant cells.  During initial visit in the hospital, I have discussed obtaining pathology and front line axitinib and pembrolizumab if this is indeed metastatic RCC. He is here for follow up after hospital discharge. He complains of worsening SOB, cough, cant sleep because of SOB. He is not sure if he can wait till tomorrow. He also complains of nausea, no appetite. No other pain. No other complaints.  Rest of the pertinent 10 point ROS reviewed and negative.  MEDICAL HISTORY:  Past Medical History:  Diagnosis Date  . Cancer [facility])    colon cancer  . Colon cancer ([place])   . GIB (gastrointestinal bleeding)   . High cholesterol   . History of colon cancer 01/23/2021  . HTN (hypertension)     SURGICAL HISTORY: Past Surgical History:  Procedure Laterality Date  . COLON RESECTION    . HEMICOLECTOMY Right   . HERNIA REPAIR    . IR THORACENTESIS ASP PLEURAL SPACE W/IMG GUIDE  01/25/2021    SOCIAL HISTORY: Social History   Socioeconomic History  . Marital status: Single    Spouse name: Not on file  . Number of children: Not on file  . Years of education: Not  on file  . Highest education level: Not on file  Occupational History  . Not on file  Tobacco Use  . Smoking status: Former Research scientist (life sciences)  . Smokeless tobacco: Never Used  Vaping Use  . Vaping Use: Never used  Substance and Sexual Activity  . Alcohol use: No  . Drug use: No  . Sexual activity: Not Currently  Other Topics Concern  . Not on file  Social History Narrative  . Not on file   Social Determinants of Health   Financial Resource Strain: Not on file  Food Insecurity: Not on file  Transportation Needs: Not on file  Physical  Activity: Not on file  Stress: Not on file  Social Connections: Not on file  Intimate Partner Violence: Not on file    FAMILY HISTORY: Family History  Problem Relation Age of Onset  . Hypertension Mother   . Hypertension Sister   . Hypertension Brother   . Heart attack Cousin   . Cancer Father     ALLERGIES:  has No Known Allergies.  MEDICATIONS:  Current Outpatient Medications  Medication Sig Dispense Refill  . ondansetron (ZOFRAN) 8 MG tablet Take 1 tablet (8 mg total) by mouth every 8 (eight) hours as needed for nausea. 30 tablet 3  . albuterol (VENTOLIN HFA) 108 (90 Base) MCG/ACT inhaler Inhale 2 puffs into the lungs every 4 (four) hours as needed.    Marland Kitchen amLODipine (NORVASC) 5 MG tablet Take 5 mg by mouth daily.     No current facility-administered medications for this visit.   PHYSICAL EXAMINATION:  ECOG PERFORMANCE STATUS: 0 - Asymptomatic  Vitals:   01/28/21 1529  BP: 136/87  Pulse: (!) 104  Resp: 20  Temp: 98.6 F (37 C)  SpO2: 93%   Filed Weights   01/28/21 1529  Weight: 182 lb 11.2 oz (82.9 kg)    GENERAL:alert, in mild respiratory distress SKIN: skin color, texture, turgor are normal, no rashes or significant lesions EYES: normal, conjunctiva are pink and non-injected, sclera clear OROPHARYNX:no exudate, no erythema and lips, buccal mucosa, and tongue normal  NECK: supple, thyroid normal size, non-tender, without nodularity LYMPH:  no palpable lymphadenopathy in the cervical, axillary or inguinal LUNGS: Right moderate pleural effusion HEART: regular rate & rhythm and no murmurs and no lower extremity edema ABDOMEN:abdomen soft, non-tender and normal bowel sounds, undefined palpable abnormality LUQ.NO hepatomegaly. Musculoskeletal:no cyanosis of digits and no clubbing  PSYCH: alert & oriented x 3 with fluent speech NEURO: no focal motor/sensory deficits  LABORATORY DATA:  I have reviewed the data as listed Lab Results  Component Value Date   WBC  9.4 01/26/2021   HGB 9.3 (L) 01/26/2021   HCT 29.3 (L) 01/26/2021   MCV 84.2 01/26/2021   PLT 456 (H) 01/26/2021     Chemistry      Component Value Date/Time   NA 137 01/26/2021 0119   K 4.3 01/26/2021 0119   CL 105 01/26/2021 0119   CO2 22 01/26/2021 0119   BUN 18 01/26/2021 0119   CREATININE 1.39 (H) 01/26/2021 0119      Component Value Date/Time   CALCIUM 8.4 (L) 01/26/2021 0119   ALKPHOS 54 01/26/2021 0119   AST 41 01/26/2021 0119   ALT 38 01/26/2021 0119   BILITOT 0.5 01/26/2021 0119       RADIOGRAPHIC STUDIES: I have personally reviewed the radiological images as listed and agreed with the findings in the report. CT ABDOMEN PELVIS W WO CONTRAST  Result Date: 01/24/2021 CLINICAL  DATA:  Renal mass EXAM: CT ABDOMEN AND PELVIS WITHOUT AND WITH CONTRAST TECHNIQUE: Multidetector CT imaging of the abdomen and pelvis was performed following the standard protocol before and following the bolus administration of intravenous contrast. CONTRAST:  126mL OMNIPAQUE IOHEXOL 300 MG/ML  SOLN COMPARISON:  None FINDINGS: Lower chest: Moderate to large right pleural effusion with numerous pleural-based metastases throughout the right hemithorax. For example, a 3.2 x 5.6 cm mass abuts the right heart border (series 7/image 21) and a 3.0 x 5.7 cm mass abuts the posterior hemidiaphragm (series 7/image 40). Associated right middle and right lower lobe atelectasis. Small left pleural effusion with lower lobe atelectasis. Multiple small parenchymal and pleural pulmonary nodules bilaterally, incompletely visualized, measuring up to 8 mm in the right middle lobe (series 6/image 8). Thoracic nodal metastases, incompletely visualized, including a dominant 2.7 cm subcarinal node (series 7/image 6). Hepatobiliary: 14 mm metastasis in segment 8 (series 11/image 54). Additional lesions may be present in the central left hepatic lobe (series 11/image 42) and central right hepatic lobe (series 11/image 63),  indeterminate. Layering small gallstones (series 11/image 78), without associated inflammatory changes. No intrahepatic or extrahepatic ductal dilatation. Pancreas: Within normal limits. Spleen: Within normal limits. Adrenals/Urinary Tract: Right adrenal gland is within normal limits. Left adrenal gland is displaced anteriorly (series 7/image 58) but likely within normal limits. Right kidney is within normal limits.  No hydronephrosis. Large left upper pole renal mass, measuring at least 13.8 x 10.0 x 15.1 cm (series 7/image 72), extending beyond Gerota's fascia. This is compatible with advanced renal cell carcinoma. No hydronephrosis. Bladder is within normal limits. Stomach/Bowel: Stomach is within normal limits. No evidence of bowel obstruction. Suspected right hemicolectomy with appendectomy. Vascular/Lymphatic: No evidence of abdominal aortic aneurysm. Atherosclerotic calcifications of the abdominal aorta and branch vessels. Expansile tumor thrombus in the left renal vein (series 7/image 70). Left para-aortic nodal metastases measuring up to 2.3 cm short axis (series 7/image 81). Reproductive: Prostate is unremarkable. Other: Trace pelvic ascites (series 11/image 139). No free air. Musculoskeletal: Old deformity involving the right posterior pelvis. No findings suspicious for lytic metastases. IMPRESSION: 15.1 cm left upper pole renal mass, compatible with advanced renal cell carcinoma. Expansile tumor thrombus in the left renal vein. Associated left para-aortic nodal metastases. Small hepatic metastases. Bilateral pulmonary metastases, incompletely visualized. Extensive pleural metastases in the right hemithorax with a moderate to large pleural effusion. Thoracic nodal metastases, incompletely visualized. Additional ancillary findings as above. Electronically Signed   By: Julian Hy M.D.   On: 01/24/2021 05:33   DG Chest 1 View  Result Date: 01/25/2021 CLINICAL DATA:  Status post thoracentesis EXAM:  CHEST  1 VIEW COMPARISON:  01/24/2021 FINDINGS: Small right pleural effusion with interval decreased compared with 01/24/2021. Right basilar atelectasis. Trace left pleural effusion. No pneumothorax. Stable cardiomediastinal silhouette. No aggressive osseous lesion. IMPRESSION: 1. Small right pleural effusion with interval decreased compared with 01/24/2021. No pneumothorax. Electronically Signed   By: Kathreen Devoid   On: 01/25/2021 11:10   DG Chest 1 View  Result Date: 01/23/2021 CLINICAL DATA:  RIGHT pleural effusion post thoracentesis EXAM: CHEST  1 VIEW COMPARISON:  Exam at 1104 hours compared to earlier study of 01/01 hours FINDINGS: Enlargement of cardiac silhouette. Decrease in RIGHT pleural effusion since prior exam with aeration of the RIGHT upper lobe now identified. Persistent atelectasis versus consolidation of RIGHT middle and RIGHT lower lobes. LEFT lung clear. No pneumothorax. IMPRESSION: No pneumothorax following RIGHT thoracentesis. Electronically Signed   By: Elta Guadeloupe  Thornton Papas M.D.   On: 01/23/2021 13:14   DG Chest 1 View  Result Date: 01/23/2021 CLINICAL DATA:  Evaluate pleural effusion EXAM: CHEST  1 VIEW COMPARISON:  11/27/2017 FINDINGS: Cardiac shadow is stable. Left lung is well aerated. Some fullness in the left suprahilar region is noted. Complete opacification of the right hemithorax is noted likely related to a large effusion and right lung consolidation. CT would be helpful for further evaluation. IMPRESSION: Complete opacification of the right hemithorax consistent with a large right-sided effusion. CT may be helpful for further evaluation. Mild fullness in the left suprahilar region. This could also be evaluated on CT examination. Electronically Signed   By: Inez Catalina M.D.   On: 01/23/2021 01:09   DG Chest Port 1 View  Result Date: 01/26/2021 CLINICAL DATA:  Shortness of breath. EXAM: PORTABLE CHEST 1 VIEW COMPARISON:  January 25, 2021. FINDINGS: Stable cardiomediastinal  silhouette. No pneumothorax is noted. Stable right pleural effusion is noted with associated atelectasis or infiltrate. Bony thorax is unremarkable. IMPRESSION: Stable right pleural effusion with associated atelectasis or infiltrate. Electronically Signed   By: Marijo Conception M.D.   On: 01/26/2021 08:21   DG Chest Port 1 View  Result Date: 01/24/2021 CLINICAL DATA:  Shortness of breath, right pleural effusion EXAM: PORTABLE CHEST 1 VIEW COMPARISON:  01/23/2021 FINDINGS: Moderate to large right pleural effusion. Associated right lung opacity, presumably compressive atelectasis, although superimposed pleural-based metastases are evident on CT. Left lung is clear. The heart is normal in size. IMPRESSION: Moderate to large right pleural effusion. Electronically Signed   By: Julian Hy M.D.   On: 01/24/2021 08:22   ECHOCARDIOGRAM COMPLETE  Result Date: 01/24/2021    ECHOCARDIOGRAM REPORT   Patient Name:   [name] Date of Exam: 01/24/2021 Medical Rec #:  [id number]       Height:       72.0 in Accession #:    [id number]      Weight:       183.9 lb Date of Birth:  22-Feb-1966       BSA:          2.056 m Patient Age:    39 years        BP:           116/57 mmHg Patient Gender: M               HR:           105 bpm. Exam Location:  Inpatient Procedure: 2D Echo, Cardiac Doppler and Color Doppler Indications:    Pericardial effusion I31.3  History:        Patient has no prior history of Echocardiogram examinations.                 Risk Factors:Hypertension.  Sonographer:    Bernadene Person RDCS Referring Phys: [id number] [place]  1. 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 There is no outflow tract obstruction. Left ventricular ejection fraction, by estimation, is >75%. The left ventricle has hyperdynamic function. The left ventricle has no regional wall motion abnormalities. There is moderate asymmetric left ventricular hypertrophy of the apical  segment. Left ventricular diastolic parameters are consistent with Grade I diastolic dysfunction (impaired relaxation). The average left ventricular global longitudinal strain is -20.1 %. The global longitudinal strain is normal.  2. Right ventricular systolic function is hyperdynamic. The right ventricular size is normal. There is moderately elevated pulmonary artery systolic pressure.  3. A  small pericardial effusion is present. The pericardial effusion is circumferential. There is no evidence of cardiac tamponade.  4. The mitral valve is normal in structure. No evidence of mitral valve regurgitation.  5. Tricuspid valve regurgitation is moderate.  6. The aortic valve is tricuspid. Aortic valve regurgitation is not visualized. No aortic stenosis is present.  7. Aortic dilatation noted. There is mild dilatation of the aortic root, measuring 41 mm. There is mild dilatation of the ascending aorta, measuring 40 mm.  8. No thrombus/mass is seen in the inferior vena cava. The inferior vena cava is normal in size with greater than 50% respiratory variability, suggesting right atrial pressure of 3 mmHg. 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 There is no outflow tract obstruction. Left ventricular ejection fraction, by estimation, is >75%. The left ventricle has hyperdynamic function. The left ventricle has no regional wall motion abnormalities. The average left ventricular global longitudinal strain is -20.1 %. The global longitudinal strain is normal. The left ventricular internal cavity size was normal in size. There is moderate asymmetric left ventricular hypertrophy of the apical segment. Left ventricular diastolic parameters are consistent with Grade I diastolic dysfunction (impaired relaxation). Normal left ventricular filling pressure. Right Ventricle: The right ventricular size is normal. No increase in right ventricular wall thickness. Right  ventricular systolic function is hyperdynamic. There is moderately elevated pulmonary artery systolic pressure. The tricuspid regurgitant velocity is 3.63 m/s, and with an assumed right atrial pressure of 3 mmHg, the estimated right ventricular systolic pressure is 57.3 mmHg. Left Atrium: Left atrial size was normal in size. Right Atrium: Right atrial size was normal in size. Pericardium: A small pericardial effusion is present. The pericardial effusion is circumferential. There is no evidence of cardiac tamponade. Mitral Valve: The mitral valve is normal in structure. No evidence of mitral valve regurgitation. Tricuspid Valve: The tricuspid valve is normal in structure. Tricuspid valve regurgitation is moderate. Aortic Valve: The aortic valve is tricuspid. Aortic valve regurgitation is not visualized. No aortic stenosis is present. Pulmonic Valve: The pulmonic valve was normal in structure. Pulmonic valve regurgitation is not visualized. Aorta: Aortic dilatation noted. There is mild dilatation of the aortic root, measuring 41 mm. There is mild dilatation of the ascending aorta, measuring 40 mm. Venous: No thrombus/mass is seen in the inferior vena cava. The inferior vena cava is normal in size with greater than 50% respiratory variability, suggesting right atrial pressure of 3 mmHg. IAS/Shunts: There is redundancy of the interatrial septum. No atrial level shunt detected by color flow Doppler.  LEFT VENTRICLE PLAX 2D LVIDd:         4.20 cm  Diastology LVIDs:         2.30 cm  LV e' medial:    5.55 cm/s LV PW:         1.50 cm  LV E/e' medial:  9.6 LV IVS:        1.50 cm  LV e' lateral:   7.83 cm/s LVOT diam:     2.50 cm  LV E/e' lateral: 6.8 LV SV:         133 LV SV Index:   64       2D Longitudinal Strain LVOT Area:     4.91 cm 2D Strain GLS Avg:     -20.1 %  RIGHT VENTRICLE RV S prime:     17.90 cm/s TAPSE (M-mode): 2.6 cm LEFT ATRIUM  Index       RIGHT ATRIUM           Index LA diam:        2.60 cm  1.26 cm/m  RA Area:     14.60 cm LA Vol (A2C):   47.1 ml 22.91 ml/m RA Volume:   36.80 ml  17.90 ml/m LA Vol (A4C):   29.0 ml 14.11 ml/m LA Biplane Vol: 38.6 ml 18.78 ml/m  AORTIC VALVE LVOT Vmax:   180.00 cm/s LVOT Vmean:  117.000 cm/s LVOT VTI:    0.270 m  AORTA Ao Root diam: 4.10 cm Ao Asc diam:  4.00 cm MITRAL VALVE               TRICUSPID VALVE MV Area (PHT): 2.91 cm    TR Peak grad:   52.7 mmHg MV Decel Time: 261 msec    TR Vmax:        363.00 cm/s MV E velocity: 53.50 cm/s MV A velocity: 76.10 cm/s  SHUNTS MV E/A ratio:  0.70        Systemic VTI:  0.27 m                            Systemic Diam: 2.50 cm Dani Gobble Croitoru MD Electronically signed by Sanda Klein MD Signature Date/Time: 01/24/2021/12:07:29 PM    Final    IR THORACENTESIS ASP PLEURAL SPACE W/IMG GUIDE  Result Date: 01/25/2021 INDICATION: Patient with history of colon cancer, dyspnea, and recurrent right pleural effusion. Request is made for therapeutic right thoracentesis. EXAM: ULTRASOUND GUIDED THERAPEUTIC RIGHT THORACENTESIS MEDICATIONS: 10 mL 1% lidocaine COMPLICATIONS: None immediate. PROCEDURE: An ultrasound guided thoracentesis was thoroughly discussed with the patient and questions answered. The benefits, risks, alternatives and complications were also discussed. The patient understands and wishes to proceed with the procedure. Written consent was obtained. Ultrasound was performed to localize and mark an adequate pocket of fluid in the right chest. The area was then prepped and draped in the normal sterile fashion. 1% Lidocaine was used for local anesthesia. Under ultrasound guidance a 6 Fr Safe-T-Centesis catheter was introduced. Thoracentesis was performed. The catheter was removed and a dressing applied. FINDINGS: A total of approximately 2 L of dark red fluid was removed. Procedure was stopped after 2 L per patient request secondary to patient's symptoms (chest pain, coughing). IMPRESSION: Successful ultrasound guided right  thoracentesis yielding 2 L of pleural fluid. Read by: Earley Abide, PA-C Electronically Signed   By: Miachel Roux M.D.   On: 01/25/2021 11:21   US THORACENTESIS ASP PLEURAL SPACE W/IMG GUIDE  Result Date: 01/23/2021 INDICATION: History of colon cancer with worsening shortness of breath and large right pleural effusion. Request for diagnostic and therapeutic thoracentesis. EXAM: ULTRASOUND GUIDED RIGHT THORACENTESIS MEDICATIONS: 1% lidocaine 10 mL COMPLICATIONS: None immediate. PROCEDURE: An ultrasound guided thoracentesis was thoroughly discussed with the patient and questions answered. The benefits, risks, alternatives and complications were also discussed. The patient understands and wishes to proceed with the procedure. Written consent was obtained. Ultrasound was performed to localize and mark an adequate pocket of fluid in the right chest. The area was then prepped and draped in the normal sterile fashion. 1% Lidocaine was used for local anesthesia. Under ultrasound guidance a 6 Fr Safe-T-Centesis catheter was introduced. Thoracentesis was performed. The catheter was removed and a dressing applied. FINDINGS: A total of approximately 2.5 L of bloody fluid was removed. Samples were sent to the laboratory  as requested by the clinical team. IMPRESSION: Successful ultrasound guided right thoracentesis yielding 2.5 L of pleural fluid. No pneumothorax on post-procedure chest x-ray. Read by: Gareth Eagle, PA-C Electronically Signed   By: Markus Daft M.D.   On: 01/23/2021 12:19   I reviewed cytology reports, negative.  All questions were answered. The patient knows to call the clinic with any problems, questions or concerns. I spent 40 minutes in the care of this patient including H and P, review of records, counseling and coordination of care. I called the ER and discussed the case with the ER doctor on call.    Benay Pike, MD 01/28/2021 4:45 PM

## 2021-01-28 NOTE — ED Triage Notes (Signed)
Patient c/o increased SOB x 1 1/2 days. Patient states, "My doctor sent me over to have fluid drained out of my lung. I have had this done 3 times in the last 7 days."

## 2021-01-28 NOTE — ED Provider Notes (Signed)
[facility] DEPT Provider Note   CSN: [id number] Arrival date & time: 01/28/21  1627     History Chief Complaint  Patient presents with  . Shortness of Breath    Romero Massi is a 55 y.o. male.  HPI   Patient presents to the ED for evaluation of increasing shortness of breath.  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  He has had recurrent pleural effusions and has required multiple thoracentesis procedures.  Patient had the procedure performed on the 26th as well as the 28th.  He went to see his oncologist today and was sent to the ED because he was having increasing shortness of breath and was told he needed to have another thoracentesis procedure  Past Medical History:  Diagnosis Date  . Cancer [facility])    colon cancer  . Colon cancer ([place])   . GIB (gastrointestinal bleeding)   . High cholesterol   . History of colon cancer 01/23/2021  . HTN (hypertension)     Patient Active Problem List   Diagnosis Date Noted  . Pleural effusion, malignant 01/28/2021  . Unintentional weight loss of more than 10 pounds 01/28/2021  . Pleural effusion on right 01/23/2021  . History of colon cancer 01/23/2021  . Essential hypertension 01/23/2021  . Metastatic carcinoma ([place]) 01/23/2021  . Acute respiratory failure with hypoxia ([place]) 01/23/2021  . Pericardial effusion 01/23/2021    Past Surgical History:  Procedure Laterality Date  . COLON RESECTION    . HEMICOLECTOMY Right   . HERNIA REPAIR    . IR THORACENTESIS ASP PLEURAL SPACE W/IMG GUIDE  01/25/2021       Family History  Problem Relation Age of Onset  . Hypertension Mother   . Hypertension Sister   . Hypertension Brother   . Heart attack Cousin   . Cancer Father     Social History   Tobacco Use  . Smoking status: Former Research scientist (life sciences)  . Smokeless tobacco: Never Used  Vaping Use  . Vaping Use: Never used  Substance Use Topics  . Alcohol use: No  . Drug use: No    Home  Medications Prior to Admission medications   Medication Sig Start Date End Date Taking? Authorizing Provider  albuterol (VENTOLIN HFA) 108 (90 Base) MCG/ACT inhaler Inhale 2 puffs into the lungs every 4 (four) hours as needed. 01/11/21   [provider]  amLODipine (NORVASC) 5 MG tablet Take 5 mg by mouth daily.    [provider]  ondansetron (ZOFRAN) 8 MG tablet Take 1 tablet (8 mg total) by mouth every 8 (eight) hours as needed for nausea. 01/28/21   Benay Pike, MD    Allergies    Patient has no known allergies.  Review of Systems   Review of Systems  All other systems reviewed and are negative.   Physical Exam Updated Vital Signs BP 128/82   Pulse (!) 102   Temp 98.6 F (37 C) (Oral)   Resp (!) 27   Ht 1.829 m (6')   Wt 82.6 kg   SpO2 92%   BMI 24.68 kg/m   Physical Exam Vitals and nursing note reviewed.  Constitutional:      General: He is not in acute distress.    Appearance: He is well-developed.  HENT:     Head: Normocephalic and atraumatic.     Right Ear: External ear normal.     Left Ear: External ear normal.  Eyes:     General: No scleral  icterus.       Right eye: No discharge.        Left eye: No discharge.     Conjunctiva/sclera: Conjunctivae normal.  Neck:     Trachea: No tracheal deviation.  Cardiovascular:     Rate and Rhythm: Normal rate and regular rhythm.  Pulmonary:     Effort: Pulmonary effort is normal. No respiratory distress.     Breath sounds: No stridor. Examination of the right-middle field reveals decreased breath sounds. Examination of the right-lower field reveals decreased breath sounds. Decreased breath sounds present. No wheezing or rales.  Abdominal:     General: Bowel sounds are normal. There is no distension.     Palpations: Abdomen is soft.     Tenderness: There is no abdominal tenderness. There is no guarding or rebound.  Musculoskeletal:        General: No tenderness.     Cervical back: Neck supple.   Skin:    General: Skin is warm and dry.     Findings: No rash.  Neurological:     Mental Status: He is alert.     Cranial Nerves: No cranial nerve deficit (no facial droop, extraocular movements intact, no slurred speech).     Sensory: No sensory deficit.     Motor: No abnormal muscle tone or seizure activity.     Coordination: Coordination normal.     ED Results / Procedures / Treatments   Labs (all labs ordered are listed, but only abnormal results are displayed) Labs Reviewed  CYTOLOGY - NON PAP    EKG None  Radiology DG Chest 2 View  Result Date: 01/28/2021 CLINICAL DATA:  Dyspnea EXAM: CHEST - 2 VIEW COMPARISON:  01/26/2021 FINDINGS: Moderate right pleural effusion increased compared to prior. Trace left pleural effusion. Airspace disease in the right middle lobe and right lower lobe. Normal heart size. IMPRESSION: Moderate right pleural effusion increased compared to prior. Trace left pleural effusion. Airspace disease in the right middle lobe and right lower lobe. Electronically Signed   By: Donavan Foil M.D.   On: 01/28/2021 17:41   DG Chest Portable 1 View  Result Date: 01/28/2021 CLINICAL DATA:  Status post thoracentesis EXAM: PORTABLE CHEST 1 VIEW COMPARISON:  Film from earlier in the same day. FINDINGS: Cardiac shadow remains enlarged. Right-sided thoracentesis has been performed with reduction in size of right-sided pleural effusion. A moderate residual effusion is noted. No pneumothorax is seen. There are changes suggestive of pleural base mass laterally on the right. No bony abnormality is seen. IMPRESSION: Reduction in right-sided pleural effusion following thoracentesis. No pneumothorax is noted. Changes suggestive of pleural based mass in the right lung. Electronically Signed   By: Inez Catalina M.D.   On: 01/28/2021 19:10    Procedures THORACENTESIS BEDSIDE  Date/Time: 01/28/2021 6:30 PM Performed by: Dorie Rank, MD Authorized by: Dorie Rank, MD   Consent:     Consent obtained:  Written   Consent given by:  Patient   Risks, benefits, and alternatives were discussed: yes     Risks discussed:  Bleeding, incomplete drainage, infection, nerve damage, pain and pneumothorax   Alternatives discussed:  Delayed treatment Universal protocol:    Procedure explained and questions answered to patient or proxy's satisfaction: yes     Imaging studies available: yes     Site/side marked: yes     Immediately prior to procedure, a time out was called: yes     Patient identity confirmed:  Verbally with patient Sedation:  Sedation type:  None Anesthesia:    Anesthesia method:  Local infiltration   Local anesthetic:  Lidocaine 1% w/o epi Procedure details:    Preparation: Patient was prepped and draped in usual sterile fashion     Patient position:  Sitting   Location:  R midscapular line   Puncture method:  Over-the-needle catheter   Ultrasound guidance: yes     Number of attempts:  1   Drainage characteristics:  Bloody Post-procedure details:    Chest x-ray performed: yes     Chest x-ray findings:  Pleural effusion improved   Procedure completion:  Tolerated well, no immediate complications Comments:     Approximately 1100 cc drained during the procedure     Medications Ordered in ED Medications - No data to display  ED Course  I have reviewed the triage vital signs and the nursing notes.  Pertinent labs & imaging results that were available during my care of the patient were reviewed by me and considered in my medical decision making (see chart for details).  Clinical Course as of 01/28/21 1928  Thu Jan 28, 2021  1705 D/w Dr Cyndia Bent.  Pt can follow up with thoracic surgery as outpatient for outpt arrangement of pelurx catheter. [UK]  0254 Discussed plan with patient.  Proceeded with the ED thoracentesis.  Plan will be for outpatient follow-up with thoracic surgery and likely will need Pleurx catheter considering his recurrent pleural effusions  [JK]  1924 Repeat chest x-ray shows improvement of pleural effusion.  No signs of pneumothorax. [JK]    Clinical Course User Index [JK] Dorie Rank, MD   MDM Rules/Calculators/A&P                          Patient presented with recurrent malignant pleural effusions.  Patient was sent from the oncology office to have the procedure done in the ED.  Patient did not have any other acute concerns or complications.  He tolerated the procedure without difficulty.  Patient noted some improvement.  Recommend outpatient follow-up with thoracic surgery for possible Pleurx catheter. Final Clinical Impression(s) / ED Diagnoses Final diagnoses:  Pleural effusion    Rx / DC Orders ED Discharge Orders    None       Dorie Rank, MD 01/28/21 1928

## 2021-01-28 NOTE — Assessment & Plan Note (Signed)
Large right pleural effusion highly suspicious for malignant pleural effusion Cytology negative however the sensitivity only approaches 65% for the first tap. If he is going to have the procedure today, I recommend sending it back to cytology. It also appears that he may need thoracentesis every 2 or 3 days which is practically very hard to schedule outpatient.  We have briefly discussed about Pleurx catheter especially since some of the Pleurx catheters can be removed later. He may benefit symptomatically from a Pleurx catheter because immunotherapy may take several weeks to work.

## 2021-01-28 NOTE — Assessment & Plan Note (Signed)
Patient states he almost lost around 40 pounds in the past 1-1 and half month because he has no appetite and he feels too tired. This is likely because of uncontrolled malignancy.  This may improve with treatment.  We will also consider nutrition referral once he is more stable.

## 2021-01-29 ENCOUNTER — Encounter (HOSPITAL_COMMUNITY): Payer: Self-pay

## 2021-01-29 ENCOUNTER — Other Ambulatory Visit: Payer: Self-pay | Admitting: Medical Oncology

## 2021-01-29 ENCOUNTER — Telehealth: Payer: Self-pay | Admitting: Hematology and Oncology

## 2021-01-29 ENCOUNTER — Ambulatory Visit
Admission: RE | Admit: 2021-01-29 | Discharge: 2021-01-29 | Disposition: A | Discharge status: Home / Self Care | Payer: Self-pay | Source: Ambulatory Visit | Attending: Hematology and Oncology | Admitting: Hematology and Oncology

## 2021-01-29 DIAGNOSIS — C799 Secondary malignant neoplasm of unspecified site: Secondary | ICD-10-CM

## 2021-01-29 DIAGNOSIS — J90 Pleural effusion, not elsewhere classified: Secondary | ICD-10-CM

## 2021-01-29 NOTE — Progress Notes (Unsigned)
[facility]     1       [name] Male, 55 y.o., 05-May-1966  MRN:  [id number] Phone:  [phone] [name])       PCP:  Glenda Chroman, MD Coverage:  Sherre Poot [employer] Ppo  Next Appt With Radiology ([facility]-CT 3) 02/08/2021 at 8:00 AM           RE: Biopsy Received: Today  Message Details  Suttle, Rosanne Ashing, MD  Lennox Solders E Approved for CT guided right pleural mass biopsy. Plan to have ultrasound available for use in CT. Possible thoracentesis at time of biopsy. Right lateral, mid-axillary masses appear to be the best candidates for tissue sampling.   [name]    Previous Messages  ----- Message -----  From: Gwenette Greet, RN  Sent: 01/29/2021  2:07 PM EDT  To: Suzette Battiest, MD, Lenore Cordia, *  Subject: RE: Biopsy                    I have requested the CT chest 3/25 be shared thru canopy from [facility].   Thanks,   RB  ----- Message -----  From: Benay Pike, MD  Sent: 01/29/2021  1:24 PM EDT  To: Harriet Butte, RN, *  Subject: RE: Biopsy                    There is CT imaging done at [facility]. Unfortunately PET will take another 2/3 weeks and will delay [place] from initiation of treatment.   Peter Congo, can u help get [place] these images. Myriam Jacobson, can u get hold of Peter Congo.   Thanks,  ----- Message -----  From: Lenore Cordia  Sent: 01/29/2021 11:57 AM EDT  To: Benay Pike, MD  Subject: FW: Biopsy                    Please see Dr Malachi Carl review  ----- Message -----  From: Suzette Battiest, MD  Sent: 01/29/2021 11:39 AM EDT  To: Lenore Cordia  Subject: RE: Biopsy                    Recommend PET/CT prior to biopsy consideration. Currently no available cross-sectional imaging of thorax in PACS.   [name]  ----- Message -----  From: Lenore Cordia  Sent: 01/29/2021 10:57 AM EDT  To: Ir Procedure Requests  Subject: Biopsy                       Procedure Requested: CT Biopsy    Reason for Procedure: Pleural nodule biopsy, suspect metastatic renal cell carcinoma.    Provider Requesting: Dr Chryl Heck  Provider Telephone: [phone]    Other Info:

## 2021-01-29 NOTE — Telephone Encounter (Signed)
Scheduled appt per 3/31 sch msg. Pt aware.  

## 2021-02-01 ENCOUNTER — Other Ambulatory Visit: Payer: Self-pay | Admitting: Hematology and Oncology

## 2021-02-01 NOTE — Progress Notes (Signed)
b

## 2021-02-02 ENCOUNTER — Telehealth: Payer: Self-pay

## 2021-02-02 ENCOUNTER — Other Ambulatory Visit: Payer: Self-pay | Admitting: Hematology and Oncology

## 2021-02-02 DIAGNOSIS — C642 Malignant neoplasm of left kidney, except renal pelvis: Secondary | ICD-10-CM

## 2021-02-02 DIAGNOSIS — J90 Pleural effusion, not elsewhere classified: Secondary | ICD-10-CM

## 2021-02-02 LAB — CYTOLOGY - NON PAP

## 2021-02-02 NOTE — Telephone Encounter (Signed)
Patient called requesting urgent thoracentesis for today. Patient notified that, per Central Scheduling, the earliest available appointment for patient for a thoracentesis would be Friday, April 8. Patient aware and insisted that he needed a thoracentesis done today. Patient notified that he would have to proceed to emergency room to have thoracentesis done emergently. Patient verbalized an understanding and stated that he would proceed to the emergency room. Dr. Chryl Heck aware of the situation.

## 2021-02-02 NOTE — Telephone Encounter (Signed)
Patient called back to inform nurse that he was "feeling better" and no longer felt that he needed an emergent thoracentesis. Patient feels that he can wait until scheduled appointment on April 11th. Patient aware of upcoming MD visit on April 7th.  Patient knows to call if he has any additional questions or concerns.

## 2021-02-04 ENCOUNTER — Other Ambulatory Visit: Payer: Self-pay

## 2021-02-04 ENCOUNTER — Encounter: Payer: Self-pay | Admitting: Hematology and Oncology

## 2021-02-04 ENCOUNTER — Inpatient Hospital Stay: Payer: BC Managed Care – PPO | Attending: Hematology and Oncology | Admitting: Hematology and Oncology

## 2021-02-04 DIAGNOSIS — R634 Abnormal weight loss: Secondary | ICD-10-CM | POA: Insufficient documentation

## 2021-02-04 DIAGNOSIS — I313 Pericardial effusion (noninflammatory): Secondary | ICD-10-CM | POA: Insufficient documentation

## 2021-02-04 DIAGNOSIS — C642 Malignant neoplasm of left kidney, except renal pelvis: Secondary | ICD-10-CM | POA: Insufficient documentation

## 2021-02-04 DIAGNOSIS — Z85038 Personal history of other malignant neoplasm of large intestine: Secondary | ICD-10-CM | POA: Diagnosis not present

## 2021-02-04 DIAGNOSIS — Z809 Family history of malignant neoplasm, unspecified: Secondary | ICD-10-CM | POA: Insufficient documentation

## 2021-02-04 DIAGNOSIS — C799 Secondary malignant neoplasm of unspecified site: Secondary | ICD-10-CM

## 2021-02-04 DIAGNOSIS — J910 Malignant pleural effusion: Secondary | ICD-10-CM | POA: Diagnosis present

## 2021-02-04 DIAGNOSIS — C778 Secondary and unspecified malignant neoplasm of lymph nodes of multiple regions: Secondary | ICD-10-CM | POA: Diagnosis not present

## 2021-02-04 DIAGNOSIS — R059 Cough, unspecified: Secondary | ICD-10-CM | POA: Insufficient documentation

## 2021-02-04 DIAGNOSIS — R0602 Shortness of breath: Secondary | ICD-10-CM | POA: Insufficient documentation

## 2021-02-04 DIAGNOSIS — R918 Other nonspecific abnormal finding of lung field: Secondary | ICD-10-CM | POA: Diagnosis not present

## 2021-02-04 DIAGNOSIS — Z87891 Personal history of nicotine dependence: Secondary | ICD-10-CM | POA: Insufficient documentation

## 2021-02-04 DIAGNOSIS — Z9049 Acquired absence of other specified parts of digestive tract: Secondary | ICD-10-CM | POA: Insufficient documentation

## 2021-02-04 DIAGNOSIS — J90 Pleural effusion, not elsewhere classified: Secondary | ICD-10-CM

## 2021-02-04 NOTE — Progress Notes (Signed)
[place] CONSULT NOTE  Patient Care Team: Thomas Chroman, MD as PCP - General (Internal Medicine) Thomas Lenis, MD as PCP - Cardiology (Cardiology)  CHIEF COMPLAINTS/PURPOSE OF CONSULTATION:  SOB  ASSESSMENT & PLAN:  Metastatic carcinoma [facility]) This is a very pleasant 55 year old male patient with past medical history significant for colon cancer in 2015 status post hemicolectomy, hypertension, dyslipidemia who was admitted with chief complaint of worsening shortness of breath, unintentional weight loss from an outside hospital.  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 His pleural fluid cytology is negative although it is consistent with an exudative effusion.  Given his past history of colon cancer, I recommended biopsy of one of the metastatic sites, personally spoke to interventional radiology and they suggested biopsy of the pleural nodule.  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  We have previously discussed mechanism of action, adverse effects from this treatment, common side effects being diarrhea and high blood pressure.   He understands that 1 to 2% of patients can have serious fatal adverse effects from  this treatment.  Unintentional weight loss of more than 10 pounds Patient states he almost lost around 40 pounds in the past 1-1 and half month because he has no appetite and he feels too tired. This is likely because of uncontrolled malignancy.  This may improve with treatment.  We will also consider nutrition referral, sent today.  Pleural effusion on right Large right pleural effusion highly suspicious for malignant pleural effusion Cytology negative however the sensitivity only approaches 65% for the first tap. Next thoracentesis and pleural biopsy planned for Monday. He understands to go to the nearest ED with worsening symptoms. He may benefit symptomatically from a Pleurx catheter because immunotherapy may take several weeks to work. I have placed a referral to thoracic surgery. I encouraged him to at least have a discussion with the surgeon.  No orders of the defined types were placed in this encounter.    HISTORY OF PRESENTING ILLNESS:   [name] 55 y.o. male is here because of concern for metastatic renal cell carcinoma.  [name] is a 55 year old male with a past medical history significant for colon cancer (status post hemicolectomy in 2015 -previously followed by [name] at [place]), hyperlipidemia, hypertension.  The patient was transferred from [facility] emergency department to [facility] due to acute hypoxic respiratory failure.  The patient has a 3-week history of cough which progressively worsened.  He subsequently developed shortness of breath. He has a recent 15-20 pound unintentional weight loss in the past few weeks and decreased oral intake due to lack of appetite and generalized weakness.  CT chest with contrast was performed on 01/22/2021 at [facility] and reviewed through care everywhere.  This showed a large solid heterogeneous mass arising off the upper pole of the left kidney consistent with primary  renal cell carcinoma, tumor extension  into the left renal vein identified, left retroperitoneal metastatic adenopathy, extensive nodal metastasis within the mediastinum and bilateral hilar regions, bilateral pleural metastasis with large malignant right pleural effusion, multiple pulmonary nodules identified compatible with pulmonary metastasis, small to moderate pericardial effusion.  The patient had a CT abdomen/pelvis performed 01/23/2021 which showed a 15.1 cm left upper pole renal mass compatible with advanced renal cell carcinoma, expansile tumor thrombus the left renal vein, so see the left para-aortic nodal metastases, small hepatic metastases, bilateral pulmonary metastases, extensive pleural metastases in the right hemithorax with moderate to large pleural effusion, thoracic nodal metastases.  The patient had ultrasound-guided thoracentesis performed 01/23/2021 with 2.5 L of fluid removed and a repeat thoracentesis performed earlier today with 2 L of fluid removed. Fluid from thoracentesis negative for malignant cells.  During initial visit in the hospital, I have discussed obtaining pathology and front line axitinib and pembrolizumab if this is indeed metastatic RCC. He is here for follow up after hospital discharge. He complains of ongoing cough and SOB, he thinks he can wait till Monday to get his thoracentesis. He also complains of nausea, no appetite. No other pain. No other complaints. He is scheduled for pleural biopsy and thoracentesis on Monday, COVID testing tomorrow.  Rest of the pertinent 10 point ROS reviewed and negative.  MEDICAL HISTORY:  Past Medical History:  Diagnosis Date  . Cancer [facility])    colon cancer  . Colon cancer ([place])   . GIB (gastrointestinal bleeding)   . High cholesterol   . History of colon cancer 01/23/2021  . HTN (hypertension)     SURGICAL HISTORY: Past Surgical History:  Procedure Laterality Date  . COLON RESECTION    . HEMICOLECTOMY Right   . HERNIA REPAIR    . IR THORACENTESIS ASP  PLEURAL SPACE W/IMG GUIDE  01/25/2021    SOCIAL HISTORY: Social History   Socioeconomic History  . Marital status: Single    Spouse name: Not on file  . Number of children: Not on file  . Years of education: Not on file  . Highest education level: Not on file  Occupational History  . Not on file  Tobacco Use  . Smoking status: Former Research scientist (life sciences)  . Smokeless tobacco: Never Used  Vaping Use  . Vaping Use: Never used  Substance and Sexual Activity  . Alcohol use: No  . Drug use: No  . Sexual activity: Not Currently  Other Topics Concern  . Not on file  Social History Narrative  . Not on file   Social Determinants of Health   Financial Resource Strain: Not on file  Food Insecurity: Not on file  Transportation Needs: Not on file  Physical Activity: Not on file  Stress: Not on file  Social Connections: Not on file  Intimate Partner Violence: Not on file    FAMILY HISTORY: Family History  Problem Relation Age of Onset  . Hypertension Mother   . Hypertension Sister   . Hypertension Brother   . Heart attack Cousin   . Cancer Father     ALLERGIES:  has No Known Allergies.  MEDICATIONS:  Current Outpatient Medications  Medication Sig Dispense Refill  . albuterol (VENTOLIN HFA) 108 (90 Base) MCG/ACT inhaler Inhale 2 puffs into the lungs every 4 (four) hours as needed for shortness of breath or wheezing.    Marland Kitchen amLODipine (NORVASC) 5 MG tablet Take 5 mg by mouth daily.    . ondansetron (ZOFRAN) 8 MG tablet  Take 1 tablet (8 mg total) by mouth every 8 (eight) hours as needed for nausea. 30 tablet 3  . pantoprazole (PROTONIX) 40 MG tablet Take 40 mg by mouth daily.    . sennosides-docusate sodium (SENOKOT-S) 8.6-50 MG tablet Take 1 tablet by mouth daily.     No current facility-administered medications for this visit.   PHYSICAL EXAMINATION:  ECOG PERFORMANCE STATUS: 0 - Asymptomatic  Vitals:   02/04/21 1443  BP: 124/64  Pulse: 96  Resp: 20  Temp: 99.1 F (37.3 C)   SpO2: 94%   Filed Weights   02/04/21 1443  Weight: 180 lb 9.6 oz (81.9 kg)    GENERAL:alert, in mild respiratory distress SKIN: skin color, texture, turgor are normal, no rashes or significant lesions EYES: normal, conjunctiva are pink and non-injected, sclera clear OROPHARYNX:no exudate, no erythema and lips, buccal mucosa, and tongue normal  NECK: supple, thyroid normal size, non-tender, without nodularity LYMPH:  no palpable lymphadenopathy in the cervical, axillary or inguinal LUNGS: Right moderate pleural effusion HEART: regular rate & rhythm and no murmurs and no lower extremity edema ABDOMEN:abdomen soft, non-tender and normal bowel sounds, undefined palpable abnormality LUQ.NO hepatomegaly. Musculoskeletal:no cyanosis of digits and no clubbing  PSYCH: alert & oriented x 3 with fluent speech NEURO: no focal motor/sensory deficits  LABORATORY DATA:  I have reviewed the data as listed Lab Results  Component Value Date   WBC 9.4 01/26/2021   HGB 9.3 (L) 01/26/2021   HCT 29.3 (L) 01/26/2021   MCV 84.2 01/26/2021   PLT 456 (H) 01/26/2021     Chemistry      Component Value Date/Time   NA 137 01/26/2021 0119   K 4.3 01/26/2021 0119   CL 105 01/26/2021 0119   CO2 22 01/26/2021 0119   BUN 18 01/26/2021 0119   CREATININE 1.39 (H) 01/26/2021 0119      Component Value Date/Time   CALCIUM 8.4 (L) 01/26/2021 0119   ALKPHOS 54 01/26/2021 0119   AST 41 01/26/2021 0119   ALT 38 01/26/2021 0119   BILITOT 0.5 01/26/2021 0119       RADIOGRAPHIC STUDIES: I have personally reviewed the radiological images as listed and agreed with the findings in the report. CT ABDOMEN PELVIS W WO CONTRAST  Result Date: 01/24/2021 CLINICAL DATA:  Renal mass EXAM: CT ABDOMEN AND PELVIS WITHOUT AND WITH CONTRAST TECHNIQUE: Multidetector CT imaging of the abdomen and pelvis was performed following the standard protocol before and following the bolus administration of intravenous contrast.  CONTRAST:  128mL OMNIPAQUE IOHEXOL 300 MG/ML  SOLN COMPARISON:  None FINDINGS: Lower chest: Moderate to large right pleural effusion with numerous pleural-based metastases throughout the right hemithorax. For example, a 3.2 x 5.6 cm mass abuts the right heart border (series 7/image 21) and a 3.0 x 5.7 cm mass abuts the posterior hemidiaphragm (series 7/image 40). Associated right middle and right lower lobe atelectasis. Small left pleural effusion with lower lobe atelectasis. Multiple small parenchymal and pleural pulmonary nodules bilaterally, incompletely visualized, measuring up to 8 mm in the right middle lobe (series 6/image 8). Thoracic nodal metastases, incompletely visualized, including a dominant 2.7 cm subcarinal node (series 7/image 6). Hepatobiliary: 14 mm metastasis in segment 8 (series 11/image 54). Additional lesions may be present in the central left hepatic lobe (series 11/image 42) and central right hepatic lobe (series 11/image 63), indeterminate. Layering small gallstones (series 11/image 78), without associated inflammatory changes. No intrahepatic or extrahepatic ductal dilatation. Pancreas: Within normal limits. Spleen: Within normal  limits. Adrenals/Urinary Tract: Right adrenal gland is within normal limits. Left adrenal gland is displaced anteriorly (series 7/image 58) but likely within normal limits. Right kidney is within normal limits.  No hydronephrosis. Large left upper pole renal mass, measuring at least 13.8 x 10.0 x 15.1 cm (series 7/image 72), extending beyond Gerota's fascia. This is compatible with advanced renal cell carcinoma. No hydronephrosis. Bladder is within normal limits. Stomach/Bowel: Stomach is within normal limits. No evidence of bowel obstruction. Suspected right hemicolectomy with appendectomy. Vascular/Lymphatic: No evidence of abdominal aortic aneurysm. Atherosclerotic calcifications of the abdominal aorta and branch vessels. Expansile tumor thrombus in the left  renal vein (series 7/image 70). Left para-aortic nodal metastases measuring up to 2.3 cm short axis (series 7/image 81). Reproductive: Prostate is unremarkable. Other: Trace pelvic ascites (series 11/image 139). No free air. Musculoskeletal: Old deformity involving the right posterior pelvis. No findings suspicious for lytic metastases. IMPRESSION: 15.1 cm left upper pole renal mass, compatible with advanced renal cell carcinoma. Expansile tumor thrombus in the left renal vein. Associated left para-aortic nodal metastases. Small hepatic metastases. Bilateral pulmonary metastases, incompletely visualized. Extensive pleural metastases in the right hemithorax with a moderate to large pleural effusion. Thoracic nodal metastases, incompletely visualized. Additional ancillary findings as above. Electronically Signed   By: Julian Hy M.D.   On: 01/24/2021 05:33   DG Chest 1 View  Result Date: 01/25/2021 CLINICAL DATA:  Status post thoracentesis EXAM: CHEST  1 VIEW COMPARISON:  01/24/2021 FINDINGS: Small right pleural effusion with interval decreased compared with 01/24/2021. Right basilar atelectasis. Trace left pleural effusion. No pneumothorax. Stable cardiomediastinal silhouette. No aggressive osseous lesion. IMPRESSION: 1. Small right pleural effusion with interval decreased compared with 01/24/2021. No pneumothorax. Electronically Signed   By: Kathreen Devoid   On: 01/25/2021 11:10   DG Chest 1 View  Result Date: 01/23/2021 CLINICAL DATA:  RIGHT pleural effusion post thoracentesis EXAM: CHEST  1 VIEW COMPARISON:  Exam at 1104 hours compared to earlier study of 01/01 hours FINDINGS: Enlargement of cardiac silhouette. Decrease in RIGHT pleural effusion since prior exam with aeration of the RIGHT upper lobe now identified. Persistent atelectasis versus consolidation of RIGHT middle and RIGHT lower lobes. LEFT lung clear. No pneumothorax. IMPRESSION: No pneumothorax following RIGHT thoracentesis.  Electronically Signed   By: Lavonia Dana M.D.   On: 01/23/2021 13:14   DG Chest 1 View  Result Date: 01/23/2021 CLINICAL DATA:  Evaluate pleural effusion EXAM: CHEST  1 VIEW COMPARISON:  11/27/2017 FINDINGS: Cardiac shadow is stable. Left lung is well aerated. Some fullness in the left suprahilar region is noted. Complete opacification of the right hemithorax is noted likely related to a large effusion and right lung consolidation. CT would be helpful for further evaluation. IMPRESSION: Complete opacification of the right hemithorax consistent with a large right-sided effusion. CT may be helpful for further evaluation. Mild fullness in the left suprahilar region. This could also be evaluated on CT examination. Electronically Signed   By: Inez Catalina M.D.   On: 01/23/2021 01:09   DG Chest 2 View  Result Date: 01/28/2021 CLINICAL DATA:  Dyspnea EXAM: CHEST - 2 VIEW COMPARISON:  01/26/2021 FINDINGS: Moderate right pleural effusion increased compared to prior. Trace left pleural effusion. Airspace disease in the right middle lobe and right lower lobe. Normal heart size. IMPRESSION: Moderate right pleural effusion increased compared to prior. Trace left pleural effusion. Airspace disease in the right middle lobe and right lower lobe. Electronically Signed   By: Donavan Foil  M.D.   On: 01/28/2021 17:41   DG Chest Portable 1 View  Result Date: 01/28/2021 CLINICAL DATA:  Status post thoracentesis EXAM: PORTABLE CHEST 1 VIEW COMPARISON:  Film from earlier in the same day. FINDINGS: Cardiac shadow remains enlarged. Right-sided thoracentesis has been performed with reduction in size of right-sided pleural effusion. A moderate residual effusion is noted. No pneumothorax is seen. There are changes suggestive of pleural base mass laterally on the right. No bony abnormality is seen. IMPRESSION: Reduction in right-sided pleural effusion following thoracentesis. No pneumothorax is noted. Changes suggestive of pleural  based mass in the right lung. Electronically Signed   By: Inez Catalina M.D.   On: 01/28/2021 19:10   DG Chest Port 1 View  Result Date: 01/26/2021 CLINICAL DATA:  Shortness of breath. EXAM: PORTABLE CHEST 1 VIEW COMPARISON:  January 25, 2021. FINDINGS: Stable cardiomediastinal silhouette. No pneumothorax is noted. Stable right pleural effusion is noted with associated atelectasis or infiltrate. Bony thorax is unremarkable. IMPRESSION: Stable right pleural effusion with associated atelectasis or infiltrate. Electronically Signed   By: Marijo Conception M.D.   On: 01/26/2021 08:21   DG Chest Port 1 View  Result Date: 01/24/2021 CLINICAL DATA:  Shortness of breath, right pleural effusion EXAM: PORTABLE CHEST 1 VIEW COMPARISON:  01/23/2021 FINDINGS: Moderate to large right pleural effusion. Associated right lung opacity, presumably compressive atelectasis, although superimposed pleural-based metastases are evident on CT. Left lung is clear. The heart is normal in size. IMPRESSION: Moderate to large right pleural effusion. Electronically Signed   By: Julian Hy M.D.   On: 01/24/2021 08:22   ECHOCARDIOGRAM COMPLETE  Result Date: 01/24/2021    ECHOCARDIOGRAM REPORT   Patient Name:   FLETCHER OSTERMILLER Date of Exam: 01/24/2021 Medical Rec #:  [id number]       Height:       72.0 in Accession #:    [id number]      Weight:       183.9 lb Date of Birth:  12-28-1965       BSA:          2.056 m Patient Age:    48 years        BP:           116/57 mmHg Patient Gender: M               HR:           105 bpm. Exam Location:  Inpatient Procedure: 2D Echo, Cardiac Doppler and Color Doppler Indications:    Pericardial effusion I31.3  History:        Patient has no prior history of Echocardiogram examinations.                 Risk Factors:Hypertension.  Sonographer:    Bernadene Person RDCS Referring Phys: [id number] [place]  1. 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  There is no outflow tract obstruction. Left ventricular ejection fraction, by estimation, is >75%. The left ventricle has hyperdynamic function. The left ventricle has no regional wall motion abnormalities. There is moderate asymmetric left ventricular hypertrophy of the apical segment. Left ventricular diastolic parameters are consistent with Grade I diastolic dysfunction (impaired relaxation). The average left ventricular global longitudinal strain is -20.1 %. The global longitudinal strain is normal.  2. Right ventricular systolic function is hyperdynamic. The right ventricular size is normal. There is moderately elevated pulmonary artery systolic pressure.  3. A small pericardial effusion is present. The  pericardial effusion is circumferential. There is no evidence of cardiac tamponade.  4. The mitral valve is normal in structure. No evidence of mitral valve regurgitation.  5. Tricuspid valve regurgitation is moderate.  6. The aortic valve is tricuspid. Aortic valve regurgitation is not visualized. No aortic stenosis is present.  7. Aortic dilatation noted. There is mild dilatation of the aortic root, measuring 41 mm. There is mild dilatation of the ascending aorta, measuring 40 mm.  8. No thrombus/mass is seen in the inferior vena cava. The inferior vena cava is normal in size with greater than 50% respiratory variability, suggesting right atrial pressure of 3 mmHg. 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 There is no outflow tract obstruction. Left ventricular ejection fraction, by estimation, is >75%. The left ventricle has hyperdynamic function. The left ventricle has no regional wall motion abnormalities. The average left ventricular global longitudinal strain is -20.1 %. The global longitudinal strain is normal. The left ventricular internal cavity size was normal in size. There is moderate asymmetric left ventricular hypertrophy of the  apical segment. Left ventricular diastolic parameters are consistent with Grade I diastolic dysfunction (impaired relaxation). Normal left ventricular filling pressure. Right Ventricle: The right ventricular size is normal. No increase in right ventricular wall thickness. Right ventricular systolic function is hyperdynamic. There is moderately elevated pulmonary artery systolic pressure. The tricuspid regurgitant velocity is 3.63 m/s, and with an assumed right atrial pressure of 3 mmHg, the estimated right ventricular systolic pressure is 61.4 mmHg. Left Atrium: Left atrial size was normal in size. Right Atrium: Right atrial size was normal in size. Pericardium: A small pericardial effusion is present. The pericardial effusion is circumferential. There is no evidence of cardiac tamponade. Mitral Valve: The mitral valve is normal in structure. No evidence of mitral valve regurgitation. Tricuspid Valve: The tricuspid valve is normal in structure. Tricuspid valve regurgitation is moderate. Aortic Valve: The aortic valve is tricuspid. Aortic valve regurgitation is not visualized. No aortic stenosis is present. Pulmonic Valve: The pulmonic valve was normal in structure. Pulmonic valve regurgitation is not visualized. Aorta: Aortic dilatation noted. There is mild dilatation of the aortic root, measuring 41 mm. There is mild dilatation of the ascending aorta, measuring 40 mm. Venous: No thrombus/mass is seen in the inferior vena cava. The inferior vena cava is normal in size with greater than 50% respiratory variability, suggesting right atrial pressure of 3 mmHg. IAS/Shunts: There is redundancy of the interatrial septum. No atrial level shunt detected by color flow Doppler.  LEFT VENTRICLE PLAX 2D LVIDd:         4.20 cm  Diastology LVIDs:         2.30 cm  LV e' medial:    5.55 cm/s LV PW:         1.50 cm  LV E/e' medial:  9.6 LV IVS:        1.50 cm  LV e' lateral:   7.83 cm/s LVOT diam:     2.50 cm  LV E/e' lateral: 6.8  LV SV:         133 LV SV Index:   64       2D Longitudinal Strain LVOT Area:     4.91 cm 2D Strain GLS Avg:     -20.1 %  RIGHT VENTRICLE RV S prime:     17.90 cm/s TAPSE (M-mode): 2.6 cm LEFT ATRIUM             Index  RIGHT ATRIUM           Index LA diam:        2.60 cm 1.26 cm/m  RA Area:     14.60 cm LA Vol (A2C):   47.1 ml 22.91 ml/m RA Volume:   36.80 ml  17.90 ml/m LA Vol (A4C):   29.0 ml 14.11 ml/m LA Biplane Vol: 38.6 ml 18.78 ml/m  AORTIC VALVE LVOT Vmax:   180.00 cm/s LVOT Vmean:  117.000 cm/s LVOT VTI:    0.270 m  AORTA Ao Root diam: 4.10 cm Ao Asc diam:  4.00 cm MITRAL VALVE               TRICUSPID VALVE MV Area (PHT): 2.91 cm    TR Peak grad:   52.7 mmHg MV Decel Time: 261 msec    TR Vmax:        363.00 cm/s MV E velocity: 53.50 cm/s MV A velocity: 76.10 cm/s  SHUNTS MV E/A ratio:  0.70        Systemic VTI:  0.27 m                            Systemic Diam: 2.50 cm Dani Gobble Croitoru MD Electronically signed by Sanda Klein MD Signature Date/Time: 01/24/2021/12:07:29 PM    Final    IR THORACENTESIS ASP PLEURAL SPACE W/IMG GUIDE  Result Date: 01/25/2021 INDICATION: Patient with history of colon cancer, dyspnea, and recurrent right pleural effusion. Request is made for therapeutic right thoracentesis. EXAM: ULTRASOUND GUIDED THERAPEUTIC RIGHT THORACENTESIS MEDICATIONS: 10 mL 1% lidocaine COMPLICATIONS: None immediate. PROCEDURE: An ultrasound guided thoracentesis was thoroughly discussed with the patient and questions answered. The benefits, risks, alternatives and complications were also discussed. The patient understands and wishes to proceed with the procedure. Written consent was obtained. Ultrasound was performed to localize and mark an adequate pocket of fluid in the right chest. The area was then prepped and draped in the normal sterile fashion. 1% Lidocaine was used for local anesthesia. Under ultrasound guidance a 6 Fr Safe-T-Centesis catheter was introduced. Thoracentesis was  performed. The catheter was removed and a dressing applied. FINDINGS: A total of approximately 2 L of dark red fluid was removed. Procedure was stopped after 2 L per patient request secondary to patient's symptoms (chest pain, coughing). IMPRESSION: Successful ultrasound guided right thoracentesis yielding 2 L of pleural fluid. Read by: Earley Abide, PA-C Electronically Signed   By: Miachel Roux M.D.   On: 01/25/2021 11:21   US THORACENTESIS ASP PLEURAL SPACE W/IMG GUIDE  Result Date: 01/23/2021 INDICATION: History of colon cancer with worsening shortness of breath and large right pleural effusion. Request for diagnostic and therapeutic thoracentesis. EXAM: ULTRASOUND GUIDED RIGHT THORACENTESIS MEDICATIONS: 1% lidocaine 10 mL COMPLICATIONS: None immediate. PROCEDURE: An ultrasound guided thoracentesis was thoroughly discussed with the patient and questions answered. The benefits, risks, alternatives and complications were also discussed. The patient understands and wishes to proceed with the procedure. Written consent was obtained. Ultrasound was performed to localize and mark an adequate pocket of fluid in the right chest. The area was then prepped and draped in the normal sterile fashion. 1% Lidocaine was used for local anesthesia. Under ultrasound guidance a 6 Fr Safe-T-Centesis catheter was introduced. Thoracentesis was performed. The catheter was removed and a dressing applied. FINDINGS: A total of approximately 2.5 L of bloody fluid was removed. Samples were sent to the laboratory as requested by the clinical team. IMPRESSION: Successful  ultrasound guided right thoracentesis yielding 2.5 L of pleural fluid. No pneumothorax on post-procedure chest x-ray. Read by: Gareth Eagle, PA-C Electronically Signed   By: Markus Daft M.D.   On: 01/23/2021 12:19   I reviewed cytology reports, negative.  All questions were answered. The patient knows to call the clinic with any problems, questions or concerns. I  spent 30 minutes in the care of this patient including H and P, review of records, counseling and coordination of care. I again discussed the importance of keeping the appointment on Monday, discussed plan of care with him, adverse effects of immunotherapy including but not limited to fatigue, hypo/hyperthyroidism, diarrhea, hepatitis, hypo/hyperglycemia. I explained that very rarely in 1 -2 % patients, it can cause fatal adverse effects and it can virtually attack any organ.    Benay Pike, MD 02/04/2021 5:22 PM

## 2021-02-04 NOTE — Assessment & Plan Note (Signed)
This is a very pleasant 55 year old male patient with past medical history significant for colon cancer in 2015 status post hemicolectomy, hypertension, dyslipidemia who was admitted with chief complaint of worsening shortness of breath, unintentional weight loss from an outside hospital.  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 His pleural fluid cytology is negative although it is consistent with an exudative effusion.  Given his past history of colon cancer, I recommended biopsy of one of the metastatic sites, personally spoke to interventional radiology and they suggested biopsy of the pleural nodule.  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  We have previously discussed mechanism of action, adverse effects from this treatment, common side effects being diarrhea and high blood pressure.   He understands that 1 to 2% of patients can have serious fatal adverse effects from this treatment.

## 2021-02-04 NOTE — Assessment & Plan Note (Signed)
Patient states he almost lost around 40 pounds in the past 1-1 and half month because he has no appetite and he feels too tired. This is likely because of uncontrolled malignancy.  This may improve with treatment.  We will also consider nutrition referral, sent today.

## 2021-02-04 NOTE — Assessment & Plan Note (Signed)
Large right pleural effusion highly suspicious for malignant pleural effusion Cytology negative however the sensitivity only approaches 65% for the first tap. Next thoracentesis and pleural biopsy planned for Monday. He understands to go to the nearest ED with worsening symptoms. He may benefit symptomatically from a Pleurx catheter because immunotherapy may take several weeks to work. I have placed a referral to thoracic surgery. I encouraged him to at least have a discussion with the surgeon.

## 2021-02-05 ENCOUNTER — Ambulatory Visit: Payer: BC Managed Care – PPO | Admitting: Physical Medicine and Rehabilitation

## 2021-02-05 ENCOUNTER — Other Ambulatory Visit: Payer: Self-pay | Admitting: Student

## 2021-02-05 ENCOUNTER — Encounter: Payer: BC Managed Care – PPO | Admitting: Physical Medicine and Rehabilitation

## 2021-02-05 ENCOUNTER — Ambulatory Visit (HOSPITAL_COMMUNITY)
Admission: RE | Admit: 2021-02-05 | Discharge: 2021-02-05 | Disposition: A | Discharge status: Home / Self Care | Payer: BC Managed Care – PPO | Source: Ambulatory Visit | Attending: Hematology and Oncology | Admitting: Hematology and Oncology

## 2021-02-05 DIAGNOSIS — Z01812 Encounter for preprocedural laboratory examination: Secondary | ICD-10-CM | POA: Diagnosis present

## 2021-02-05 DIAGNOSIS — Z20822 Contact with and (suspected) exposure to covid-19: Secondary | ICD-10-CM | POA: Insufficient documentation

## 2021-02-05 LAB — SARS CORONAVIRUS 2 (TAT 6-24 HRS): SARS Coronavirus 2: NEGATIVE

## 2021-02-08 ENCOUNTER — Other Ambulatory Visit: Payer: Self-pay | Admitting: Thoracic Surgery (Cardiothoracic Vascular Surgery)

## 2021-02-08 ENCOUNTER — Ambulatory Visit (HOSPITAL_COMMUNITY)
Admission: RE | Admit: 2021-02-08 | Discharge: 2021-02-08 | Disposition: A | Discharge status: Home / Self Care | Payer: BC Managed Care – PPO | Source: Ambulatory Visit | Attending: Hematology and Oncology | Admitting: Hematology and Oncology

## 2021-02-08 ENCOUNTER — Telehealth: Payer: Self-pay | Admitting: Hematology and Oncology

## 2021-02-08 ENCOUNTER — Encounter (HOSPITAL_COMMUNITY): Payer: Self-pay

## 2021-02-08 DIAGNOSIS — Z85038 Personal history of other malignant neoplasm of large intestine: Secondary | ICD-10-CM | POA: Insufficient documentation

## 2021-02-08 DIAGNOSIS — J910 Malignant pleural effusion: Secondary | ICD-10-CM | POA: Diagnosis not present

## 2021-02-08 DIAGNOSIS — J90 Pleural effusion, not elsewhere classified: Secondary | ICD-10-CM

## 2021-02-08 DIAGNOSIS — C642 Malignant neoplasm of left kidney, except renal pelvis: Secondary | ICD-10-CM | POA: Diagnosis present

## 2021-02-08 LAB — CBC
HCT: 30.9 % — ABNORMAL LOW (ref 39.0–52.0)
Hemoglobin: 9.6 g/dL — ABNORMAL LOW (ref 13.0–17.0)
MCH: 26 pg (ref 26.0–34.0)
MCHC: 31.1 g/dL (ref 30.0–36.0)
MCV: 83.7 fL (ref 80.0–100.0)
Platelets: 683 10*3/uL — ABNORMAL HIGH (ref 150–400)
RBC: 3.69 MIL/uL — ABNORMAL LOW (ref 4.22–5.81)
RDW: 14.7 % (ref 11.5–15.5)
WBC: 7.4 10*3/uL (ref 4.0–10.5)
nRBC: 0 % (ref 0.0–0.2)

## 2021-02-08 LAB — PROTIME-INR
INR: 1.2 (ref 0.8–1.2)
Prothrombin Time: 14.8 seconds (ref 11.4–15.2)

## 2021-02-08 MED ORDER — LIDOCAINE 1% INJECTION FOR CIRCUMCISION
10.0000 mL | INJECTION | Freq: Once | INTRAVENOUS | Status: DC
  Filled 2021-02-08 (×2): qty 10

## 2021-02-08 MED ORDER — MIDAZOLAM HCL 2 MG/2ML IJ SOLN
INTRAMUSCULAR | Status: AC | PRN
  Administered 2021-02-08: 1 mg via INTRAVENOUS
  Administered 2021-02-08: 0.5 mg via INTRAVENOUS

## 2021-02-08 MED ORDER — FENTANYL CITRATE (PF) 100 MCG/2ML IJ SOLN
INTRAMUSCULAR | Status: AC
  Filled 2021-02-08: qty 2

## 2021-02-08 MED ORDER — FENTANYL CITRATE (PF) 100 MCG/2ML IJ SOLN
INTRAMUSCULAR | Status: AC | PRN
  Administered 2021-02-08: 50 ug via INTRAVENOUS
  Administered 2021-02-08: 25 ug via INTRAVENOUS

## 2021-02-08 MED ORDER — SODIUM CHLORIDE 0.9 % IV SOLN: INTRAVENOUS | Status: DC

## 2021-02-08 MED ORDER — SODIUM CHLORIDE 0.9 % IV SOLN
INTRAVENOUS | Status: AC | PRN
  Administered 2021-02-08: 10 mL/h via INTRAVENOUS

## 2021-02-08 MED ORDER — LIDOCAINE HCL 1 % IJ SOLN
INTRAMUSCULAR | Status: AC
  Filled 2021-02-08: qty 20

## 2021-02-08 MED ORDER — MIDAZOLAM HCL 2 MG/2ML IJ SOLN
INTRAMUSCULAR | Status: AC
  Filled 2021-02-08: qty 2

## 2021-02-08 NOTE — H&P (Addendum)
Chief Complaint: Patient was seen in consultation today for right pleural mass biopsy and thoracentesis at the request of [place]  Referring Physician(s): Wayne Lakes  Supervising Physician: Corrie Mckusick  Patient Status: [facility] - Out-pt  History of Present Illness: [name] is a 55 y.o. male   Hx Colon Ca 2015 HTN; HLD Complains of worsening SOB and wt loss Has had 3 Thoracentesis recently: 3/26 (2L); 3/28 (2L)- in IR; and 3/31(1L) in ED No malig cells on Cyto  He is SOB now; tachycardic   Presented to MD for OP evaluation: Imaging revealing solid heterogeneous mass arising of the upper pole of the left kidney consistent with primary renal cell carcinoma with tumor extending into the left renal vein left retroperitoneal metastatic adenopathy and extensive nodal metastasis within the mediastinum bilateral pleural mets with large malignant right pleural effusion, multiple pulmonary nodules and small to moderate pericardial effusion without evidence of tamponade  Dr Chryl Heck Oncology following pt Note 02/04/21:   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  We have previously discussed mechanism of action, adverse effects from this treatment, common side effects being diarrhea and high blood pressure.   He understands that 1 to 2% of patients can have serious fatal adverse effects from this treatment.  Scheduled now for biopsy of right pleural mass and probable thoracentesis today in IR   Past Medical History:  Diagnosis Date  . Cancer [facility])    colon cancer  . Colon cancer ([place])   . GIB (gastrointestinal bleeding)   . High cholesterol   . History of colon cancer 01/23/2021   . HTN (hypertension)     Past Surgical History:  Procedure Laterality Date  . COLON RESECTION    . HEMICOLECTOMY Right   . HERNIA REPAIR    . IR THORACENTESIS ASP PLEURAL SPACE W/IMG GUIDE  01/25/2021    Allergies: Patient has no known allergies.  Medications: Prior to Admission medications   Medication Sig Start Date End Date Taking? Authorizing Provider  albuterol (VENTOLIN HFA) 108 (90 Base) MCG/ACT inhaler Inhale 2 puffs into the lungs every 4 (four) hours as needed for shortness of breath or wheezing. 01/11/21  Yes [provider]  amLODipine (NORVASC) 5 MG tablet Take 5 mg by mouth daily.   Yes [provider]  meloxicam (MOBIC) 7.5 MG tablet Take 7.5 mg by mouth daily.   Yes [provider]  ondansetron (ZOFRAN) 8 MG tablet Take 1 tablet (8 mg total) by mouth every 8 (eight) hours as needed for nausea. 01/28/21  Yes Benay Pike, MD  pantoprazole (PROTONIX) 40 MG tablet Take 40 mg by mouth daily. 01/29/21  Yes [provider]  sennosides-docusate sodium (SENOKOT-S) 8.6-50 MG tablet Take 1 tablet by mouth daily.   Yes [provider]     Family History  Problem Relation Age of Onset  . Hypertension Mother   . Hypertension Sister   . Hypertension Brother   . Heart attack Cousin   . Cancer Father     Social History   Socioeconomic History  . Marital status: Single    Spouse name: Not on file  . Number of children: Not on file  . Years of education: Not on file  . Highest education level: Not  on file  Occupational History  . Not on file  Tobacco Use  . Smoking status: Former Research scientist (life sciences)  . Smokeless tobacco: Never Used  Vaping Use  . Vaping Use: Never used  Substance and Sexual Activity  . Alcohol use: No  . Drug use: No  . Sexual activity: Not Currently  Other Topics Concern  . Not on file  Social History Narrative  . Not on file   Social Determinants of Health   Financial Resource Strain: Not on file  Food  Insecurity: Not on file  Transportation Needs: Not on file  Physical Activity: Not on file  Stress: Not on file  Social Connections: Not on file    Review of Systems: A 12 point ROS discussed and pertinent positives are indicated in the HPI above.  All other systems are negative.  Review of Systems  Constitutional: Positive for activity change, appetite change, fatigue and unexpected weight change.  HENT: Positive for postnasal drip.   Respiratory: Positive for cough and shortness of breath.   Cardiovascular: Negative for chest pain.  Gastrointestinal: Negative for abdominal pain and nausea.  Neurological: Positive for weakness.  Psychiatric/Behavioral: Negative for behavioral problems and confusion.    Vital Signs: BP 128/84   Pulse 100   Temp 98.4 F (36.9 C) (Oral)   Resp 16   Ht 6' (1.829 m)   Wt 175 lb (79.4 kg)   SpO2 92%   BMI 23.73 kg/m   Physical Exam Vitals reviewed.  HENT:     Mouth/Throat:     Mouth: Mucous membranes are moist.  Cardiovascular:     Rate and Rhythm: Regular rhythm. Tachycardia present.     Heart sounds: Normal heart sounds.  Pulmonary:     Effort: Pulmonary effort is normal.     Breath sounds: Normal breath sounds.     Comments: Decreased on Rt Abdominal:     Palpations: Abdomen is soft.  Musculoskeletal:        General: Normal range of motion.  Skin:    General: Skin is warm.  Neurological:     Mental Status: He is alert and oriented to person, place, and time.  Psychiatric:        Behavior: Behavior normal.     Imaging: CT ABDOMEN PELVIS W WO CONTRAST  Result Date: 01/24/2021 CLINICAL DATA:  Renal mass EXAM: CT ABDOMEN AND PELVIS WITHOUT AND WITH CONTRAST TECHNIQUE: Multidetector CT imaging of the abdomen and pelvis was performed following the standard protocol before and following the bolus administration of intravenous contrast. CONTRAST:  169mL OMNIPAQUE IOHEXOL 300 MG/ML  SOLN COMPARISON:  None FINDINGS: Lower chest:  Moderate to large right pleural effusion with numerous pleural-based metastases throughout the right hemithorax. For example, a 3.2 x 5.6 cm mass abuts the right heart border (series 7/image 21) and a 3.0 x 5.7 cm mass abuts the posterior hemidiaphragm (series 7/image 40). Associated right middle and right lower lobe atelectasis. Small left pleural effusion with lower lobe atelectasis. Multiple small parenchymal and pleural pulmonary nodules bilaterally, incompletely visualized, measuring up to 8 mm in the right middle lobe (series 6/image 8). Thoracic nodal metastases, incompletely visualized, including a dominant 2.7 cm subcarinal node (series 7/image 6). Hepatobiliary: 14 mm metastasis in segment 8 (series 11/image 54). Additional lesions may be present in the central left hepatic lobe (series 11/image 42) and central right hepatic lobe (series 11/image 63), indeterminate. Layering small gallstones (series 11/image 78), without associated inflammatory changes. No intrahepatic or extrahepatic ductal dilatation. Pancreas:  Within normal limits. Spleen: Within normal limits. Adrenals/Urinary Tract: Right adrenal gland is within normal limits. Left adrenal gland is displaced anteriorly (series 7/image 58) but likely within normal limits. Right kidney is within normal limits.  No hydronephrosis. Large left upper pole renal mass, measuring at least 13.8 x 10.0 x 15.1 cm (series 7/image 72), extending beyond Gerota's fascia. This is compatible with advanced renal cell carcinoma. No hydronephrosis. Bladder is within normal limits. Stomach/Bowel: Stomach is within normal limits. No evidence of bowel obstruction. Suspected right hemicolectomy with appendectomy. Vascular/Lymphatic: No evidence of abdominal aortic aneurysm. Atherosclerotic calcifications of the abdominal aorta and branch vessels. Expansile tumor thrombus in the left renal vein (series 7/image 70). Left para-aortic nodal metastases measuring up to 2.3 cm short  axis (series 7/image 81). Reproductive: Prostate is unremarkable. Other: Trace pelvic ascites (series 11/image 139). No free air. Musculoskeletal: Old deformity involving the right posterior pelvis. No findings suspicious for lytic metastases. IMPRESSION: 15.1 cm left upper pole renal mass, compatible with advanced renal cell carcinoma. Expansile tumor thrombus in the left renal vein. Associated left para-aortic nodal metastases. Small hepatic metastases. Bilateral pulmonary metastases, incompletely visualized. Extensive pleural metastases in the right hemithorax with a moderate to large pleural effusion. Thoracic nodal metastases, incompletely visualized. Additional ancillary findings as above. Electronically Signed   By: Julian Hy M.D.   On: 01/24/2021 05:33   DG Chest 1 View  Result Date: 01/25/2021 CLINICAL DATA:  Status post thoracentesis EXAM: CHEST  1 VIEW COMPARISON:  01/24/2021 FINDINGS: Small right pleural effusion with interval decreased compared with 01/24/2021. Right basilar atelectasis. Trace left pleural effusion. No pneumothorax. Stable cardiomediastinal silhouette. No aggressive osseous lesion. IMPRESSION: 1. Small right pleural effusion with interval decreased compared with 01/24/2021. No pneumothorax. Electronically Signed   By: Kathreen Devoid   On: 01/25/2021 11:10   DG Chest 1 View  Result Date: 01/23/2021 CLINICAL DATA:  RIGHT pleural effusion post thoracentesis EXAM: CHEST  1 VIEW COMPARISON:  Exam at 1104 hours compared to earlier study of 01/01 hours FINDINGS: Enlargement of cardiac silhouette. Decrease in RIGHT pleural effusion since prior exam with aeration of the RIGHT upper lobe now identified. Persistent atelectasis versus consolidation of RIGHT middle and RIGHT lower lobes. LEFT lung clear. No pneumothorax. IMPRESSION: No pneumothorax following RIGHT thoracentesis. Electronically Signed   By: Lavonia Dana M.D.   On: 01/23/2021 13:14   DG Chest 1 View  Result Date:  01/23/2021 CLINICAL DATA:  Evaluate pleural effusion EXAM: CHEST  1 VIEW COMPARISON:  11/27/2017 FINDINGS: Cardiac shadow is stable. Left lung is well aerated. Some fullness in the left suprahilar region is noted. Complete opacification of the right hemithorax is noted likely related to a large effusion and right lung consolidation. CT would be helpful for further evaluation. IMPRESSION: Complete opacification of the right hemithorax consistent with a large right-sided effusion. CT may be helpful for further evaluation. Mild fullness in the left suprahilar region. This could also be evaluated on CT examination. Electronically Signed   By: Inez Catalina M.D.   On: 01/23/2021 01:09   DG Chest 2 View  Result Date: 01/28/2021 CLINICAL DATA:  Dyspnea EXAM: CHEST - 2 VIEW COMPARISON:  01/26/2021 FINDINGS: Moderate right pleural effusion increased compared to prior. Trace left pleural effusion. Airspace disease in the right middle lobe and right lower lobe. Normal heart size. IMPRESSION: Moderate right pleural effusion increased compared to prior. Trace left pleural effusion. Airspace disease in the right middle lobe and right lower lobe. Electronically Signed  By: Donavan Foil M.D.   On: 01/28/2021 17:41   DG Chest Portable 1 View  Result Date: 01/28/2021 CLINICAL DATA:  Status post thoracentesis EXAM: PORTABLE CHEST 1 VIEW COMPARISON:  Film from earlier in the same day. FINDINGS: Cardiac shadow remains enlarged. Right-sided thoracentesis has been performed with reduction in size of right-sided pleural effusion. A moderate residual effusion is noted. No pneumothorax is seen. There are changes suggestive of pleural base mass laterally on the right. No bony abnormality is seen. IMPRESSION: Reduction in right-sided pleural effusion following thoracentesis. No pneumothorax is noted. Changes suggestive of pleural based mass in the right lung. Electronically Signed   By: Inez Catalina M.D.   On: 01/28/2021 19:10   DG  Chest Port 1 View  Result Date: 01/26/2021 CLINICAL DATA:  Shortness of breath. EXAM: PORTABLE CHEST 1 VIEW COMPARISON:  January 25, 2021. FINDINGS: Stable cardiomediastinal silhouette. No pneumothorax is noted. Stable right pleural effusion is noted with associated atelectasis or infiltrate. Bony thorax is unremarkable. IMPRESSION: Stable right pleural effusion with associated atelectasis or infiltrate. Electronically Signed   By: Marijo Conception M.D.   On: 01/26/2021 08:21   DG Chest Port 1 View  Result Date: 01/24/2021 CLINICAL DATA:  Shortness of breath, right pleural effusion EXAM: PORTABLE CHEST 1 VIEW COMPARISON:  01/23/2021 FINDINGS: Moderate to large right pleural effusion. Associated right lung opacity, presumably compressive atelectasis, although superimposed pleural-based metastases are evident on CT. Left lung is clear. The heart is normal in size. IMPRESSION: Moderate to large right pleural effusion. Electronically Signed   By: Julian Hy M.D.   On: 01/24/2021 08:22   ECHOCARDIOGRAM COMPLETE  Result Date: 01/24/2021    ECHOCARDIOGRAM REPORT   Patient Name:   [name] Date of Exam: 01/24/2021 Medical Rec #:  [id number]       Height:       72.0 in Accession #:    [id number]      Weight:       183.9 lb Date of Birth:  [birth year]       BSA:          2.056 m Patient Age:    8 years        BP:           116/57 mmHg Patient Gender: M               HR:           105 bpm. Exam Location:  Inpatient Procedure: 2D Echo, Cardiac Doppler and Color Doppler Indications:    Pericardial effusion I31.3  History:        Patient has no prior history of Echocardiogram examinations.                 Risk Factors:Hypertension.  Sonographer:    Bernadene Person RDCS Referring Phys: [id number] [place]  1. 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 There is no outflow tract obstruction. Left ventricular ejection fraction, by estimation, is >75%. The left  ventricle has hyperdynamic function. The left ventricle has no regional wall motion abnormalities. There is moderate asymmetric left ventricular hypertrophy of the apical segment. Left ventricular diastolic parameters are consistent with Grade I diastolic dysfunction (impaired relaxation). The average left ventricular global longitudinal strain is -20.1 %. The global longitudinal strain is normal.  2. Right ventricular systolic function is hyperdynamic. The right ventricular size is normal. There is moderately elevated pulmonary artery systolic pressure.  3. A small pericardial  effusion is present. The pericardial effusion is circumferential. There is no evidence of cardiac tamponade.  4. The mitral valve is normal in structure. No evidence of mitral valve regurgitation.  5. Tricuspid valve regurgitation is moderate.  6. The aortic valve is tricuspid. Aortic valve regurgitation is not visualized. No aortic stenosis is present.  7. Aortic dilatation noted. There is mild dilatation of the aortic root, measuring 41 mm. There is mild dilatation of the ascending aorta, measuring 40 mm.  8. No thrombus/mass is seen in the inferior vena cava. The inferior vena cava is normal in size with greater than 50% respiratory variability, suggesting right atrial pressure of 3 mmHg. 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 There is no outflow tract obstruction. Left ventricular ejection fraction, by estimation, is >75%. The left ventricle has hyperdynamic function. The left ventricle has no regional wall motion abnormalities. The average left ventricular global longitudinal strain is -20.1 %. The global longitudinal strain is normal. The left ventricular internal cavity size was normal in size. There is moderate asymmetric left ventricular hypertrophy of the apical segment. Left ventricular diastolic parameters are consistent with Grade I diastolic dysfunction  (impaired relaxation). Normal left ventricular filling pressure. Right Ventricle: The right ventricular size is normal. No increase in right ventricular wall thickness. Right ventricular systolic function is hyperdynamic. There is moderately elevated pulmonary artery systolic pressure. The tricuspid regurgitant velocity is 3.63 m/s, and with an assumed right atrial pressure of 3 mmHg, the estimated right ventricular systolic pressure is 25.6 mmHg. Left Atrium: Left atrial size was normal in size. Right Atrium: Right atrial size was normal in size. Pericardium: A small pericardial effusion is present. The pericardial effusion is circumferential. There is no evidence of cardiac tamponade. Mitral Valve: The mitral valve is normal in structure. No evidence of mitral valve regurgitation. Tricuspid Valve: The tricuspid valve is normal in structure. Tricuspid valve regurgitation is moderate. Aortic Valve: The aortic valve is tricuspid. Aortic valve regurgitation is not visualized. No aortic stenosis is present. Pulmonic Valve: The pulmonic valve was normal in structure. Pulmonic valve regurgitation is not visualized. Aorta: Aortic dilatation noted. There is mild dilatation of the aortic root, measuring 41 mm. There is mild dilatation of the ascending aorta, measuring 40 mm. Venous: No thrombus/mass is seen in the inferior vena cava. The inferior vena cava is normal in size with greater than 50% respiratory variability, suggesting right atrial pressure of 3 mmHg. IAS/Shunts: There is redundancy of the interatrial septum. No atrial level shunt detected by color flow Doppler.  LEFT VENTRICLE PLAX 2D LVIDd:         4.20 cm  Diastology LVIDs:         2.30 cm  LV e' medial:    5.55 cm/s LV PW:         1.50 cm  LV E/e' medial:  9.6 LV IVS:        1.50 cm  LV e' lateral:   7.83 cm/s LVOT diam:     2.50 cm  LV E/e' lateral: 6.8 LV SV:         133 LV SV Index:   64       2D Longitudinal Strain LVOT Area:     4.91 cm 2D Strain GLS  Avg:     -20.1 %  RIGHT VENTRICLE RV S prime:     17.90 cm/s TAPSE (M-mode): 2.6 cm LEFT ATRIUM  Index       RIGHT ATRIUM           Index LA diam:        2.60 cm 1.26 cm/m  RA Area:     14.60 cm LA Vol (A2C):   47.1 ml 22.91 ml/m RA Volume:   36.80 ml  17.90 ml/m LA Vol (A4C):   29.0 ml 14.11 ml/m LA Biplane Vol: 38.6 ml 18.78 ml/m  AORTIC VALVE LVOT Vmax:   180.00 cm/s LVOT Vmean:  117.000 cm/s LVOT VTI:    0.270 m  AORTA Ao Root diam: 4.10 cm Ao Asc diam:  4.00 cm MITRAL VALVE               TRICUSPID VALVE MV Area (PHT): 2.91 cm    TR Peak grad:   52.7 mmHg MV Decel Time: 261 msec    TR Vmax:        363.00 cm/s MV E velocity: 53.50 cm/s MV A velocity: 76.10 cm/s  SHUNTS MV E/A ratio:  0.70        Systemic VTI:  0.27 m                            Systemic Diam: 2.50 cm Dani Gobble Croitoru MD Electronically signed by Sanda Klein MD Signature Date/Time: 01/24/2021/12:07:29 PM    Final    IR THORACENTESIS ASP PLEURAL SPACE W/IMG GUIDE  Result Date: 01/25/2021 INDICATION: Patient with history of colon cancer, dyspnea, and recurrent right pleural effusion. Request is made for therapeutic right thoracentesis. EXAM: ULTRASOUND GUIDED THERAPEUTIC RIGHT THORACENTESIS MEDICATIONS: 10 mL 1% lidocaine COMPLICATIONS: None immediate. PROCEDURE: An ultrasound guided thoracentesis was thoroughly discussed with the patient and questions answered. The benefits, risks, alternatives and complications were also discussed. The patient understands and wishes to proceed with the procedure. Written consent was obtained. Ultrasound was performed to localize and mark an adequate pocket of fluid in the right chest. The area was then prepped and draped in the normal sterile fashion. 1% Lidocaine was used for local anesthesia. Under ultrasound guidance a 6 Fr Safe-T-Centesis catheter was introduced. Thoracentesis was performed. The catheter was removed and a dressing applied. FINDINGS: A total of approximately 2 L of dark red  fluid was removed. Procedure was stopped after 2 L per patient request secondary to patient's symptoms (chest pain, coughing). IMPRESSION: Successful ultrasound guided right thoracentesis yielding 2 L of pleural fluid. Read by: Earley Abide, PA-C Electronically Signed   By: Miachel Roux M.D.   On: 01/25/2021 11:21   US THORACENTESIS ASP PLEURAL SPACE W/IMG GUIDE  Result Date: 01/23/2021 INDICATION: History of colon cancer with worsening shortness of breath and large right pleural effusion. Request for diagnostic and therapeutic thoracentesis. EXAM: ULTRASOUND GUIDED RIGHT THORACENTESIS MEDICATIONS: 1% lidocaine 10 mL COMPLICATIONS: None immediate. PROCEDURE: An ultrasound guided thoracentesis was thoroughly discussed with the patient and questions answered. The benefits, risks, alternatives and complications were also discussed. The patient understands and wishes to proceed with the procedure. Written consent was obtained. Ultrasound was performed to localize and mark an adequate pocket of fluid in the right chest. The area was then prepped and draped in the normal sterile fashion. 1% Lidocaine was used for local anesthesia. Under ultrasound guidance a 6 Fr Safe-T-Centesis catheter was introduced. Thoracentesis was performed. The catheter was removed and a dressing applied. FINDINGS: A total of approximately 2.5 L of bloody fluid was removed. Samples were sent to the laboratory as  requested by the clinical team. IMPRESSION: Successful ultrasound guided right thoracentesis yielding 2.5 L of pleural fluid. No pneumothorax on post-procedure chest x-ray. Read by: Gareth Eagle, PA-C Electronically Signed   By: Markus Daft M.D.   On: 01/23/2021 12:19    Labs:  CBC: Recent Labs    01/24/21 0211 01/25/21 0223 01/26/21 0119 02/08/21 0610  WBC 9.3 10.7* 9.4 7.4  HGB 9.5* 9.5* 9.3* 9.6*  HCT 30.0* 29.7* 29.3* 30.9*  PLT 471* 412* 456* 683*    COAGS: Recent Labs    01/23/21 0048 02/08/21 0610  INR 1.2  1.2  APTT 38*  --     BMP: Recent Labs    01/23/21 0048 01/24/21 0211 01/25/21 0223 01/26/21 0119  NA 138 136 135 137  K 4.7 4.5 4.4 4.3  CL 105 103 104 105  CO2 23 24 25 22   GLUCOSE 103* 101* 116* 97  BUN 14 15 17 18   CALCIUM 8.7* 8.6* 8.3* 8.4*  CREATININE 1.53* 1.55* 1.54* 1.39*  GFRNONAA 53* 53* 53* 60*    LIVER FUNCTION TESTS: Recent Labs    01/23/21 0048 01/24/21 0211 01/25/21 0223 01/26/21 0119  BILITOT 0.8 0.6 0.3 0.5  AST 23 21 25  41  ALT 26 25 29  38  ALKPHOS 45 52 55 54  PROT 6.7 6.0* 6.2* 6.0*  ALBUMIN 2.3* 2.1* 2.1* 2.1*    TUMOR MARKERS: No results for input(s): AFPTM, CEA, CA199, CHROMGRNA in the last 8760 hours.  Assessment and Plan:  Known Hx Colon Ca 2015 New unintentional wt loss-- 40 lbs or more SOB; cough Imaging revealing left renal mass consistent with RCC; para aortic lymphadenopathy; Bilateral pulmonary metastasis and large effusion. Scheduled now for right pleural mass biopsy with probable thoracentesis. Risks and benefits of CT guided lung nodule biopsy was discussed with the patient including, but not limited to bleeding, hemoptysis, respiratory failure requiring intubation, infection, pneumothorax requiring chest tube placement, stroke from air embolism or even death.  All of the patient's questions were answered and the patient is agreeable to proceed. Consent signed and in chart.   Thank you for this interesting consult.  I greatly enjoyed meeting [name] and look forward to participating in their care.  A copy of this report was sent to the requesting provider on this date.  Electronically Signed: Lavonia Drafts, PA-C 02/08/2021, 7:19 AM   I spent a total of  30 Minutes   in face to face in clinical consultation, greater than 50% of which was counseling/coordinating care for right pleural mass biopsy; Rt thoracentesis

## 2021-02-08 NOTE — Procedures (Signed)
Interventional Radiology Procedure Note  Procedure: CT guided biopsy of right pleural met, suspect RCC. Therapeutic thoracentesis. ~2L of serosanguinous fluid aspirated.   Complications: None  Samples: Path for the pleural met, mx 18g core  Recommendations: - Bedrest  1 hr - Advance diet - 1 hr dc home when goals met  Signed,  Corrie Mckusick, DO

## 2021-02-08 NOTE — Telephone Encounter (Signed)
Called pt and left a msg for a call back to get him scheduled for an appt

## 2021-02-08 NOTE — Discharge Instructions (Addendum)
Needle Biopsy, Care After °These instructions tell you how to care for yourself after your procedure. Your doctor may also give you more specific instructions. Call your doctor if you have any problems or questions. °What can I expect after the procedure? °After the procedure, it is common to have: °· Soreness. °· Bruising. °· Mild pain. °Follow these instructions at home: °· Return to your normal activities as told by your doctor. Ask your doctor what activities are safe for you. °· Take over-the-counter and prescription medicines only as told by your doctor. °· Wash your hands with soap and water before you change your bandage (dressing). If you cannot use soap and water, use hand sanitizer. °· Follow instructions from your doctor about: °? How to take care of your puncture site. °? When and how to change your bandage. °? When to remove your bandage. °· Check your puncture site every day for signs of infection. Watch for: °? Redness, swelling, or pain. °? Fluid or blood. °? Pus or a bad smell. °? Warmth. °· Do not take baths, swim, or use a hot tub until your doctor approves. Ask your doctor if you may take showers. You may only be allowed to take sponge baths. °· Keep all follow-up visits as told by your doctor. This is important.   °Contact a doctor if you have: °· A fever. °· Redness, swelling, or pain at the puncture site, and it lasts longer than a few days. °· Fluid, blood, or pus coming from the puncture site. °· Warmth coming from the puncture site. °Get help right away if: °· You have a lot of bleeding from the puncture site. °Summary °· After the procedure, it is common to have soreness, bruising, or mild pain at the puncture site. °· Check your puncture site every day for signs of infection, such as redness, swelling, or pain. °· Get help right away if you have severe bleeding from your puncture site. °This information is not intended to replace advice given to you by your health care provider. Make  sure you discuss any questions you have with your health care provider. °Document Revised: 04/16/2020 Document Reviewed: 04/16/2020 °Elsevier Patient Education © 2021 [employer] °Moderate Conscious Sedation, Adult °Sedation is the use of medicines to promote relaxation and to relieve discomfort and anxiety. Moderate conscious sedation is a type of sedation. Under moderate conscious sedation, you are less alert than normal, but you are still able to respond to instructions, touch, or both. °Moderate conscious sedation is used during short medical and dental procedures. It is milder than deep sedation, which is a type of sedation under which you cannot be easily woken up. It is also milder than general anesthesia, which is the use of medicines to make you unconscious. Moderate conscious sedation allows you to return to your regular activities sooner. °Tell a health care provider about: °· Any allergies you have. °· All medicines you are taking, including vitamins, herbs, eye drops, creams, and over-the-counter medicines. °· Any use of steroids. This includes steroids taken by mouth or as a cream. °· Any problems you or family members have had with sedatives and anesthetic medicines. °· Any blood disorders you have. °· Any surgeries you have had. °· Any medical conditions you have, such as sleep apnea. °· Whether you are pregnant or may be pregnant. °· Any use of cigarettes, alcohol, marijuana, or drugs. °What are the risks? °Generally, this is a safe procedure. However, problems may occur, including: °· Getting too much   medicine (oversedation). °· Nausea. °· Allergic reaction to medicines. °· Trouble breathing. If this happens, a breathing tube may be used. It will be removed when you are awake and breathing on your own. °· Heart trouble. °· Lung trouble. °· Confusion that gets better with time (emergence delirium). °What happens before the procedure? °Staying hydrated °Follow instructions from your health care  provider about hydration, which may include: °· Up to 2 hours before the procedure - you may continue to drink clear liquids, such as water, clear fruit juice, black coffee, and plain tea. °Eating and drinking restrictions °Follow instructions from your health care provider about eating and drinking, which may include: °· 8 hours before the procedure - stop eating heavy meals or foods, such as meat, fried foods, or fatty foods. °· 6 hours before the procedure - stop eating light meals or foods, such as toast or cereal. °· 6 hours before the procedure - stop drinking milk or drinks that contain milk. °· 2 hours before the procedure - stop drinking clear liquids. °Medicines °Ask your health care provider about: °· Changing or stopping your regular medicines. This is especially important if you are taking diabetes medicines or blood thinners. °· Taking medicines such as aspirin and ibuprofen. These medicines can thin your blood. Do not take these medicines unless your health care provider tells you to take them. °· Taking over-the-counter medicines, vitamins, herbs, and supplements. °Tests and exams °· You will have a physical exam. °· You may have blood tests done to show how well: °? Your kidneys and liver work. °? Your blood clots. °General instructions °· Plan to have a responsible adult take you home from the hospital or clinic. °· If you will be going home right after the procedure, plan to have a responsible adult care for you for the time you are told. This is important. °What happens during the procedure? °· You will be given the sedative. The sedative may be given: °? As a pill that you will swallow. It can also be inserted into the rectum. °? As a spray through the nose. °? As an injection into the muscle. °? As an injection into the vein through an IV. °· You may be given oxygen as needed. °· Your breathing, heart rate, and blood pressure will be monitored during the procedure. °· The medical or dental  procedure will be done. °The procedure may vary among health care providers and hospitals.   °What happens after the procedure? °· Your blood pressure, heart rate, breathing rate, and blood oxygen level will be monitored until you leave the hospital or clinic. °· You will get fluids through your IV if needed. °· Do not drive or operate machinery until your health care provider says that it is safe. °Summary °· Sedation is the use of medicines to promote relaxation and to relieve discomfort and anxiety. Moderate conscious sedation is a type of sedation that is used during short medical and dental procedures. °· Tell the health care provider about any medical conditions that you have and about all the medicines that you are taking. °· You will be given the sedative as a pill, a spray through the nose, an injection into the muscle, or an injection into the vein through an IV. Vital signs are monitored during the sedation. °· Moderate conscious sedation allows you to return to your regular activities sooner. °This information is not intended to replace advice given to you by your health care provider. Make sure you discuss   any questions you have with your health care provider. °Document Revised: 02/14/2020 Document Reviewed: 09/12/2019 °Elsevier Patient Education © 2021 Elsevier Inc. ° °  ° °

## 2021-02-09 ENCOUNTER — Telehealth: Payer: Self-pay | Admitting: Hematology and Oncology

## 2021-02-09 NOTE — Telephone Encounter (Signed)
Scheduled per 4/8 sch msg. Called and spoke with pt, confirmed 4/25 appt

## 2021-02-10 ENCOUNTER — Ambulatory Visit
Admission: RE | Admit: 2021-02-10 | Discharge: 2021-02-10 | Disposition: A | Discharge status: Home / Self Care | Payer: BC Managed Care – PPO | Source: Ambulatory Visit | Attending: Thoracic Surgery (Cardiothoracic Vascular Surgery) | Admitting: Thoracic Surgery (Cardiothoracic Vascular Surgery)

## 2021-02-10 ENCOUNTER — Other Ambulatory Visit: Payer: Self-pay

## 2021-02-10 ENCOUNTER — Institutional Professional Consult (permissible substitution) (INDEPENDENT_AMBULATORY_CARE_PROVIDER_SITE_OTHER): Payer: BC Managed Care – PPO | Admitting: Thoracic Surgery (Cardiothoracic Vascular Surgery)

## 2021-02-10 ENCOUNTER — Other Ambulatory Visit: Payer: Self-pay | Admitting: *Deleted

## 2021-02-10 ENCOUNTER — Encounter: Payer: Self-pay | Admitting: Thoracic Surgery (Cardiothoracic Vascular Surgery)

## 2021-02-10 VITALS — BP 114/69 | HR 100 | Resp 20 | Ht 72.0 in | Wt 176.0 lb

## 2021-02-10 DIAGNOSIS — J90 Pleural effusion, not elsewhere classified: Secondary | ICD-10-CM

## 2021-02-10 LAB — SURGICAL PATHOLOGY

## 2021-02-10 NOTE — Progress Notes (Signed)
PCP is Glenda Chroman, MD Referring Provider is Benay Pike, MD  Chief Complaint  Patient presents with  . Pleural Effusion    Initial surgical consult, CT chest 3/25, ECHO 3/27, Thoracentesis 3/31, cxr today    HPI: [name] is sent for consultation regarding a malignant right pleural effusion.  [name] is a 55 year old man with a past medical history significant for colon cancer, hypertension, and hyperlipidemia.  He had a hemicolectomy for colon cancer in 2015.  He more recently presented with shortness of breath and weight loss.  He had imaging which showed a solid mass arising from the left kidney consistent with a primary renal cell carcinoma.  Tumor extended into the left renal vein and there was retroperitoneal adenopathy and mediastinal adenopathy.  He also had bilateral lung and pleural metastases with a large right pleural effusion.  He had a thoracentesis.  Initial cytology was negative.  02/08/2021 he underwent a CT-guided biopsy and drainage of the fluid.  Biopsy showed metastatic carcinoma.  When he initially presented he had white out of the chest.  He had 2 thoracenteses in a relatively short period of time.  He has never had the effusion completely drained.  He says he has now been drained a total of 4 times in the past month.  Each time about 2 L was withdrawn.  He is dyspneic with almost any exertion.  He is okay at rest.  He does have orthopnea.  He has had a 20 pound weight loss over the past 3 months and about 35 pounds over the past 6 months.  Appetite is poor and he has early satiety.  Zubrod Score: At the time of surgery this patient's most appropriate activity status/level should be described as: []     0    Normal activity, no symptoms []     1    Restricted in physical strenuous activity but ambulatory, able to do out light work [x]     2    Ambulatory and capable of self care, unable to do work activities, up and about >50 % of waking hours                               []     3    Only limited self care, in bed greater than 50% of waking hours []     4    Completely disabled, no self care, confined to bed or chair []     5    Moribund  Past Medical History:  Diagnosis Date  . Cancer [facility])    colon cancer  . Colon cancer ([place])   . GIB (gastrointestinal bleeding)   . High cholesterol   . History of colon cancer 01/23/2021  . HTN (hypertension)     Past Surgical History:  Procedure Laterality Date  . COLON RESECTION    . HEMICOLECTOMY Right   . HERNIA REPAIR    . IR THORACENTESIS ASP PLEURAL SPACE W/IMG GUIDE  01/25/2021    Family History  Problem Relation Age of Onset  . Hypertension Mother   . Hypertension Sister   . Hypertension Brother   . Heart attack Cousin   . Cancer Father     Social History Social History   Tobacco Use  . Smoking status: Former Research scientist (life sciences)  . Smokeless tobacco: Never Used  Vaping Use  . Vaping Use: Never used  Substance Use Topics  . Alcohol use: No  .  Drug use: No    Current Outpatient Medications  Medication Sig Dispense Refill  . albuterol (VENTOLIN HFA) 108 (90 Base) MCG/ACT inhaler Inhale 2 puffs into the lungs every 4 (four) hours as needed for shortness of breath or wheezing.    Marland Kitchen amLODipine (NORVASC) 5 MG tablet Take 5 mg by mouth daily.    . meloxicam (MOBIC) 7.5 MG tablet Take 7.5 mg by mouth daily.    . ondansetron (ZOFRAN) 8 MG tablet Take 1 tablet (8 mg total) by mouth every 8 (eight) hours as needed for nausea. 30 tablet 3  . pantoprazole (PROTONIX) 40 MG tablet Take 40 mg by mouth daily.    . sennosides-docusate sodium (SENOKOT-S) 8.6-50 MG tablet Take 1 tablet by mouth daily.     No current facility-administered medications for this visit.    No Known Allergies  Review of Systems  Constitutional: Positive for activity change, appetite change, fatigue and unexpected weight change.  HENT: Negative for trouble swallowing and voice change.   Eyes: Negative for visual disturbance.   Respiratory: Positive for shortness of breath.        Orthopnea  Cardiovascular: Negative for leg swelling.  Gastrointestinal: Negative for abdominal distention and abdominal pain.  All other systems reviewed and are negative.   BP 114/69 (BP Location: Right Arm, Patient Position: Sitting)   Pulse 100   Resp 20   Ht 6' (1.829 m)   Wt 176 lb (79.8 kg)   SpO2 90% Comment: RA  BMI 23.87 kg/m  Physical Exam Vitals reviewed.  Constitutional:      General: He is not in acute distress.    Appearance: He is ill-appearing.  HENT:     Head: Normocephalic and atraumatic.  Eyes:     General: No scleral icterus.    Extraocular Movements: Extraocular movements intact.  Cardiovascular:     Rate and Rhythm: Normal rate and regular rhythm.     Heart sounds: Normal heart sounds. No murmur heard.   Pulmonary:     Effort: Pulmonary effort is normal. No respiratory distress.     Breath sounds: No wheezing or rales.     Comments: Absent breath sounds lower two thirds of the right Abdominal:     General: There is no distension.     Palpations: Abdomen is soft.  Musculoskeletal:     Cervical back: Neck supple.  Lymphadenopathy:     Cervical: No cervical adenopathy.  Skin:    General: Skin is warm and dry.  Neurological:     General: No focal deficit present.     Mental Status: He is alert and oriented to person, place, and time.     Cranial Nerves: No cranial nerve deficit.     Motor: No weakness.    Diagnostic Tests: CHEST - 2 VIEW  COMPARISON:  01/18/2021  FINDINGS: Pleural-based densities in the right chest are compatible with pleural fluid and nodular pleural disease. Right pleural effusion has slightly increased from the previous examination. Vague opacities in the periphery of the right lung could be related to pleural fluid and/or compressive atelectasis. Evidence for small left pleural effusion. Vague opacity in the left perihilar region is concerning for nodularity  or neoplastic disease. Heart size is stable. Trachea is midline.  IMPRESSION: 1. Right pleural effusion and right pleural disease has slightly progressed since 01/28/2021. Extensive volume loss and compressive atelectasis in the right lung. 2. Evidence for neoplastic disease in the chest demonstrated by pleural nodularity and vague opacity in the  left lung. 3. Small left pleural effusion.   Electronically Signed   By: Markus Daft M.D.   On: 02/10/2021 10:37 I personally reviewed the chest x-ray and CT images.  There is a large right pleural effusion with extensive volume loss.  Pleural masses noted on CT images.  Impression: [name] is a 55 year old man with a history of colon cancer, hyperlipidemia, and hypertension.  He presented with shortness of breath, weight loss, and malaise.  Work-up showed a renal mass consistent with renal cell carcinoma.  There was extensive adenopathy in the retroperitoneum and mediastinum and also lung and pleural masses present he has a large right pleural effusion.  He initially had complete white out of the right chest on his chest x-ray.  He said 4 drainage procedures over the past month or so.  He has had some symptomatic improvement with each procedure but that is relatively short-lived.  I recommended that we place a pleural catheter for symptomatic relief of his right pleural effusion.  The fluid has never been completely evacuated so it is unclear if his lung will totally reexpand or not.  However he should have symptomatic relief even if it does not.  I described the proposed procedure to him.  He understands we will place the catheter in the operating room with local anesthesia and intravenous sedation.  We will plan to do it as an outpatient procedure.  He understands he will have an indwelling catheter that will require ongoing care.  He understands that this is palliative and not curative.  I informed him of the indications, risks, benefits,  and alternatives.  He understands the risks include bleeding, catheter malposition, infection, and catheter occlusion, as well as the possibility of other unforeseeable complications.  He accepts the risk and agrees to proceed.  We will arrange for home health nurse after catheter placement to assist with drainage and education.  Plan: Right pleural catheter placement on Monday, 02/15/2021  I spent over 30 minutes in review of records, images, and consultation with [name] today. Melrose Nakayama, MD [facility] [phone]

## 2021-02-10 NOTE — H&P (View-Only) (Signed)
PCP is Glenda Chroman, MD Referring Provider is Benay Pike, MD  Chief Complaint  Patient presents with  . Pleural Effusion    Initial surgical consult, CT chest 3/25, ECHO 3/27, Thoracentesis 3/31, cxr today    HPI: [name] is sent for consultation regarding a malignant right pleural effusion.  [name] is a 55 year old man with a past medical history significant for colon cancer, hypertension, and hyperlipidemia.  He had a hemicolectomy for colon cancer in 2015.  He more recently presented with shortness of breath and weight loss.  He had imaging which showed a solid mass arising from the left kidney consistent with a primary renal cell carcinoma.  Tumor extended into the left renal vein and there was retroperitoneal adenopathy and mediastinal adenopathy.  He also had bilateral lung and pleural metastases with a large right pleural effusion.  He had a thoracentesis.  Initial cytology was negative.  02/08/2021 he underwent a CT-guided biopsy and drainage of the fluid.  Biopsy showed metastatic carcinoma.  When he initially presented he had white out of the chest.  He had 2 thoracenteses in a relatively short period of time.  He has never had the effusion completely drained.  He says he has now been drained a total of 4 times in the past month.  Each time about 2 L was withdrawn.  He is dyspneic with almost any exertion.  He is okay at rest.  He does have orthopnea.  He has had a 20 pound weight loss over the past 3 months and about 35 pounds over the past 6 months.  Appetite is poor and he has early satiety.  Zubrod Score: At the time of surgery this patient's most appropriate activity status/level should be described as: []     0    Normal activity, no symptoms []     1    Restricted in physical strenuous activity but ambulatory, able to do out light work [x]     2    Ambulatory and capable of self care, unable to do work activities, up and about >50 % of waking hours                               []     3    Only limited self care, in bed greater than 50% of waking hours []     4    Completely disabled, no self care, confined to bed or chair []     5    Moribund  Past Medical History:  Diagnosis Date  . Cancer [facility])    colon cancer  . Colon cancer ([place])   . GIB (gastrointestinal bleeding)   . High cholesterol   . History of colon cancer 01/23/2021  . HTN (hypertension)     Past Surgical History:  Procedure Laterality Date  . COLON RESECTION    . HEMICOLECTOMY Right   . HERNIA REPAIR    . IR THORACENTESIS ASP PLEURAL SPACE W/IMG GUIDE  01/25/2021    Family History  Problem Relation Age of Onset  . Hypertension Mother   . Hypertension Sister   . Hypertension Brother   . Heart attack Cousin   . Cancer Father     Social History Social History   Tobacco Use  . Smoking status: Former Research scientist (life sciences)  . Smokeless tobacco: Never Used  Vaping Use  . Vaping Use: Never used  Substance Use Topics  . Alcohol use: No  .  Drug use: No    Current Outpatient Medications  Medication Sig Dispense Refill  . albuterol (VENTOLIN HFA) 108 (90 Base) MCG/ACT inhaler Inhale 2 puffs into the lungs every 4 (four) hours as needed for shortness of breath or wheezing.    Marland Kitchen amLODipine (NORVASC) 5 MG tablet Take 5 mg by mouth daily.    . meloxicam (MOBIC) 7.5 MG tablet Take 7.5 mg by mouth daily.    . ondansetron (ZOFRAN) 8 MG tablet Take 1 tablet (8 mg total) by mouth every 8 (eight) hours as needed for nausea. 30 tablet 3  . pantoprazole (PROTONIX) 40 MG tablet Take 40 mg by mouth daily.    . sennosides-docusate sodium (SENOKOT-S) 8.6-50 MG tablet Take 1 tablet by mouth daily.     No current facility-administered medications for this visit.    No Known Allergies  Review of Systems  Constitutional: Positive for activity change, appetite change, fatigue and unexpected weight change.  HENT: Negative for trouble swallowing and voice change.   Eyes: Negative for visual disturbance.   Respiratory: Positive for shortness of breath.        Orthopnea  Cardiovascular: Negative for leg swelling.  Gastrointestinal: Negative for abdominal distention and abdominal pain.  All other systems reviewed and are negative.   BP 114/69 (BP Location: Right Arm, Patient Position: Sitting)   Pulse 100   Resp 20   Ht 6' (1.829 m)   Wt 176 lb (79.8 kg)   SpO2 90% Comment: RA  BMI 23.87 kg/m  Physical Exam Vitals reviewed.  Constitutional:      General: He is not in acute distress.    Appearance: He is ill-appearing.  HENT:     Head: Normocephalic and atraumatic.  Eyes:     General: No scleral icterus.    Extraocular Movements: Extraocular movements intact.  Cardiovascular:     Rate and Rhythm: Normal rate and regular rhythm.     Heart sounds: Normal heart sounds. No murmur heard.   Pulmonary:     Effort: Pulmonary effort is normal. No respiratory distress.     Breath sounds: No wheezing or rales.     Comments: Absent breath sounds lower two thirds of the right Abdominal:     General: There is no distension.     Palpations: Abdomen is soft.  Musculoskeletal:     Cervical back: Neck supple.  Lymphadenopathy:     Cervical: No cervical adenopathy.  Skin:    General: Skin is warm and dry.  Neurological:     General: No focal deficit present.     Mental Status: He is alert and oriented to person, place, and time.     Cranial Nerves: No cranial nerve deficit.     Motor: No weakness.    Diagnostic Tests: CHEST - 2 VIEW  COMPARISON:  01/18/2021  FINDINGS: Pleural-based densities in the right chest are compatible with pleural fluid and nodular pleural disease. Right pleural effusion has slightly increased from the previous examination. Vague opacities in the periphery of the right lung could be related to pleural fluid and/or compressive atelectasis. Evidence for small left pleural effusion. Vague opacity in the left perihilar region is concerning for nodularity  or neoplastic disease. Heart size is stable. Trachea is midline.  IMPRESSION: 1. Right pleural effusion and right pleural disease has slightly progressed since 01/28/2021. Extensive volume loss and compressive atelectasis in the right lung. 2. Evidence for neoplastic disease in the chest demonstrated by pleural nodularity and vague opacity in the  left lung. 3. Small left pleural effusion.   Electronically Signed   By: Markus Daft M.D.   On: 02/10/2021 10:37 I personally reviewed the chest x-ray and CT images.  There is a large right pleural effusion with extensive volume loss.  Pleural masses noted on CT images.  Impression: [name] is a 55 year old man with a history of colon cancer, hyperlipidemia, and hypertension.  He presented with shortness of breath, weight loss, and malaise.  Work-up showed a renal mass consistent with renal cell carcinoma.  There was extensive adenopathy in the retroperitoneum and mediastinum and also lung and pleural masses present he has a large right pleural effusion.  He initially had complete white out of the right chest on his chest x-ray.  He said 4 drainage procedures over the past month or so.  He has had some symptomatic improvement with each procedure but that is relatively short-lived.  I recommended that we place a pleural catheter for symptomatic relief of his right pleural effusion.  The fluid has never been completely evacuated so it is unclear if his lung will totally reexpand or not.  However he should have symptomatic relief even if it does not.  I described the proposed procedure to him.  He understands we will place the catheter in the operating room with local anesthesia and intravenous sedation.  We will plan to do it as an outpatient procedure.  He understands he will have an indwelling catheter that will require ongoing care.  He understands that this is palliative and not curative.  I informed him of the indications, risks, benefits,  and alternatives.  He understands the risks include bleeding, catheter malposition, infection, and catheter occlusion, as well as the possibility of other unforeseeable complications.  He accepts the risk and agrees to proceed.  We will arrange for home health nurse after catheter placement to assist with drainage and education.  Plan: Right pleural catheter placement on Monday, 02/15/2021  I spent over 30 minutes in review of records, images, and consultation with Mr. Ernster today. Melrose Nakayama, MD [facility] [phone]

## 2021-02-11 ENCOUNTER — Other Ambulatory Visit: Payer: Self-pay | Admitting: *Deleted

## 2021-02-11 MED ORDER — ONDANSETRON HCL 8 MG PO TABS: 8.0000 mg | ORAL_TABLET | Freq: Three times a day (TID) | ORAL | 3 refills | Status: AC | PRN

## 2021-02-11 NOTE — Progress Notes (Signed)
Surgical Instructions    Your procedure is scheduled on 02/15/21.  Report to [facility] Main Entrance "A" at 7:35 A.M., then check in with the Admitting office.  Call this number if you have problems the morning of surgery:  [phone]   If you have any questions prior to your surgery date call [phone]: Open Monday-Friday 8am-4pm    Remember:  Do not eat OR drink after midnight the night before your surgery    Take these medicines the morning of surgery with A SIP OF WATER: amLODipine (NORVASC) pantoprazole (PROTONIX)  If needed: ondansetron (ZOFRAN) albuterol (VENTOLIN HFA)  As of today, STOP taking any Aspirin (unless otherwise instructed by your surgeon) Aleve, Naproxen, Ibuprofen, Motrin, Advil, Goody's, BC's, all herbal medications, fish oil, and all vitamins.                     Do not wear jewelry            Do not wear lotions, powders, colognes, or deodorant.            Do not shave 48 hours prior to surgery.  Men may shave face and neck.            Do not bring valuables to the hospital.            [facility] is not responsible for any belongings or valuables.  Do NOT Smoke (Tobacco/Vaping) or drink Alcohol 24 hours prior to your procedure If you use a CPAP at night, you may bring all equipment for your overnight stay.   Contacts, glasses, dentures or bridgework may not be worn into surgery, please bring cases for these belongings   For patients admitted to the hospital, discharge time will be determined by your treatment team.   Patients discharged the day of surgery will not be allowed to drive home, and someone needs to stay with them for 24 hours.    Special instructions:   [place]- Preparing For Surgery  Before surgery, you can play an important role. Because skin is not sterile, your skin needs to be as free of germs as possible. You can reduce the number of germs on your skin by washing with CHG (chlorahexidine gluconate) Soap before surgery.   CHG is an antiseptic cleaner which kills germs and bonds with the skin to continue killing germs even after washing.    Oral Hygiene is also important to reduce your risk of infection.  Remember - BRUSH YOUR TEETH THE MORNING OF SURGERY WITH YOUR REGULAR TOOTHPASTE  Please do not use if you have an allergy to CHG or antibacterial soaps. If your skin becomes reddened/irritated stop using the CHG.  Do not shave (including legs and underarms) for at least 48 hours prior to first CHG shower. It is OK to shave your face.  Please follow these instructions carefully.   1. Shower the NIGHT BEFORE SURGERY and the MORNING OF SURGERY  2. If you chose to wash your hair, wash your hair first as usual with your normal shampoo.  3. After you shampoo, rinse your hair and body thoroughly to remove the shampoo.  4. Wash Face and genitals (private parts) with your normal soap.   5.  Shower the NIGHT BEFORE SURGERY and the MORNING OF SURGERY with CHG Soap.   6. Use CHG Soap as you would any other liquid soap. You can apply CHG directly to the skin and wash gently with a scrungie or a clean washcloth.  7. Apply the CHG Soap to your body ONLY FROM THE NECK DOWN.  Do not use on open wounds or open sores. Avoid contact with your eyes, ears, mouth and genitals (private parts). Wash Face and genitals (private parts)  with your normal soap.   8. Wash thoroughly, paying special attention to the area where your surgery will be performed.  9. Thoroughly rinse your body with warm water from the neck down.  10. DO NOT shower/wash with your normal soap after using and rinsing off the CHG Soap.  11. Pat yourself dry with a CLEAN TOWEL.  12. Wear CLEAN PAJAMAS to bed the night before surgery  13. Place CLEAN SHEETS on your bed the night before your surgery  14. DO NOT SLEEP WITH PETS.   Day of Surgery: Take a shower with CHG soap. Wear Clean/Comfortable clothing the morning of surgery Do not apply any  deodorants/lotions.   Remember to brush your teeth WITH YOUR REGULAR TOOTHPASTE.   Please read over the following fact sheets that you were given.

## 2021-02-12 ENCOUNTER — Encounter (HOSPITAL_COMMUNITY): Payer: Self-pay

## 2021-02-12 ENCOUNTER — Ambulatory Visit (HOSPITAL_COMMUNITY)
Admission: RE | Admit: 2021-02-12 | Discharge: 2021-02-12 | Disposition: A | Discharge status: Home / Self Care | Payer: BC Managed Care – PPO | Source: Ambulatory Visit | Attending: Thoracic Surgery (Cardiothoracic Vascular Surgery) | Admitting: Thoracic Surgery (Cardiothoracic Vascular Surgery)

## 2021-02-12 ENCOUNTER — Other Ambulatory Visit (HOSPITAL_COMMUNITY)
Admission: RE | Admit: 2021-02-12 | Discharge: 2021-02-12 | Disposition: A | Discharge status: Home / Self Care | Payer: BC Managed Care – PPO | Source: Ambulatory Visit | Attending: Thoracic Surgery (Cardiothoracic Vascular Surgery) | Admitting: Thoracic Surgery (Cardiothoracic Vascular Surgery)

## 2021-02-12 ENCOUNTER — Other Ambulatory Visit: Payer: Self-pay

## 2021-02-12 ENCOUNTER — Encounter (HOSPITAL_COMMUNITY)
Admission: RE | Admit: 2021-02-12 | Discharge: 2021-02-12 | Disposition: A | Discharge status: Home / Self Care | Payer: BC Managed Care – PPO | Source: Ambulatory Visit | Attending: Thoracic Surgery (Cardiothoracic Vascular Surgery) | Admitting: Thoracic Surgery (Cardiothoracic Vascular Surgery)

## 2021-02-12 DIAGNOSIS — C7801 Secondary malignant neoplasm of right lung: Secondary | ICD-10-CM | POA: Diagnosis not present

## 2021-02-12 DIAGNOSIS — Z79899 Other long term (current) drug therapy: Secondary | ICD-10-CM | POA: Diagnosis not present

## 2021-02-12 DIAGNOSIS — R590 Localized enlarged lymph nodes: Secondary | ICD-10-CM | POA: Diagnosis not present

## 2021-02-12 DIAGNOSIS — C782 Secondary malignant neoplasm of pleura: Secondary | ICD-10-CM | POA: Diagnosis not present

## 2021-02-12 DIAGNOSIS — C7802 Secondary malignant neoplasm of left lung: Secondary | ICD-10-CM | POA: Diagnosis not present

## 2021-02-12 DIAGNOSIS — Z791 Long term (current) use of non-steroidal anti-inflammatories (NSAID): Secondary | ICD-10-CM | POA: Diagnosis not present

## 2021-02-12 DIAGNOSIS — Z87891 Personal history of nicotine dependence: Secondary | ICD-10-CM | POA: Diagnosis not present

## 2021-02-12 DIAGNOSIS — J90 Pleural effusion, not elsewhere classified: Secondary | ICD-10-CM | POA: Diagnosis not present

## 2021-02-12 DIAGNOSIS — Z85038 Personal history of other malignant neoplasm of large intestine: Secondary | ICD-10-CM | POA: Diagnosis not present

## 2021-02-12 DIAGNOSIS — I10 Essential (primary) hypertension: Secondary | ICD-10-CM | POA: Diagnosis not present

## 2021-02-12 DIAGNOSIS — Z01818 Encounter for other preprocedural examination: Secondary | ICD-10-CM

## 2021-02-12 DIAGNOSIS — J910 Malignant pleural effusion: Secondary | ICD-10-CM | POA: Diagnosis present

## 2021-02-12 DIAGNOSIS — Z01812 Encounter for preprocedural laboratory examination: Secondary | ICD-10-CM | POA: Insufficient documentation

## 2021-02-12 DIAGNOSIS — Z9049 Acquired absence of other specified parts of digestive tract: Secondary | ICD-10-CM | POA: Diagnosis not present

## 2021-02-12 DIAGNOSIS — Z20822 Contact with and (suspected) exposure to covid-19: Secondary | ICD-10-CM | POA: Insufficient documentation

## 2021-02-12 DIAGNOSIS — N2889 Other specified disorders of kidney and ureter: Secondary | ICD-10-CM | POA: Diagnosis not present

## 2021-02-12 HISTORY — DX: Dyspnea, unspecified: R06.00

## 2021-02-12 HISTORY — DX: Anemia, unspecified: D64.9

## 2021-02-12 LAB — SURGICAL PCR SCREEN
MRSA, PCR: NEGATIVE
Staphylococcus aureus: NEGATIVE

## 2021-02-12 LAB — CBC
HCT: 28.9 % — ABNORMAL LOW (ref 39.0–52.0)
Hemoglobin: 9 g/dL — ABNORMAL LOW (ref 13.0–17.0)
MCH: 25.9 pg — ABNORMAL LOW (ref 26.0–34.0)
MCHC: 31.1 g/dL (ref 30.0–36.0)
MCV: 83.3 fL (ref 80.0–100.0)
Platelets: 696 10*3/uL — ABNORMAL HIGH (ref 150–400)
RBC: 3.47 MIL/uL — ABNORMAL LOW (ref 4.22–5.81)
RDW: 14.8 % (ref 11.5–15.5)
WBC: 8.1 10*3/uL (ref 4.0–10.5)
nRBC: 0 % (ref 0.0–0.2)

## 2021-02-12 LAB — COMPREHENSIVE METABOLIC PANEL
ALT: 36 U/L (ref 0–44)
AST: 24 U/L (ref 15–41)
Albumin: 2.2 g/dL — ABNORMAL LOW (ref 3.5–5.0)
Alkaline Phosphatase: 63 U/L (ref 38–126)
Anion gap: 12 (ref 5–15)
BUN: 17 mg/dL (ref 6–20)
CO2: 21 mmol/L — ABNORMAL LOW (ref 22–32)
Calcium: 9 mg/dL (ref 8.9–10.3)
Chloride: 103 mmol/L (ref 98–111)
Creatinine, Ser: 1.67 mg/dL — ABNORMAL HIGH (ref 0.61–1.24)
GFR, Estimated: 48 mL/min — ABNORMAL LOW (ref >60)
Glucose, Bld: 113 mg/dL — ABNORMAL HIGH (ref 70–99)
Potassium: 5.3 mmol/L — ABNORMAL HIGH (ref 3.5–5.1)
Sodium: 136 mmol/L (ref 135–145)
Total Bilirubin: 1 mg/dL (ref 0.3–1.2)
Total Protein: 6.9 g/dL (ref 6.5–8.1)

## 2021-02-12 LAB — PROTIME-INR
INR: 1.2 (ref 0.8–1.2)
Prothrombin Time: 15.4 seconds — ABNORMAL HIGH (ref 11.4–15.2)

## 2021-02-12 LAB — APTT: aPTT: 36 seconds (ref 24–36)

## 2021-02-12 LAB — SARS CORONAVIRUS 2 (TAT 6-24 HRS): SARS Coronavirus 2: NEGATIVE

## 2021-02-12 NOTE — Progress Notes (Addendum)
PCP - Dr. Jerene Bears Cardiologist - Dr. Zandra Abts  Chest x-ray - 02/12/21 Pending EKG - 01/23/21 Stress Test - denies ECHO - 01/24/21 Cardiac Cath - denies  Sleep Study - denies CPAP - denies   Blood Thinner Instructions: n/a Aspirin Instructions: n/a   COVID TEST- 02/12/21 Pending   Anesthesia review: Yes  Patient denies shortness of breath, fever, cough and chest pain at PAT appointment   All instructions explained to the patient, with a verbal understanding of the material. Patient agrees to go over the instructions while at home for a better understanding. Patient also instructed to self quarantine after being tested for COVID-19. The opportunity to ask questions was provided.

## 2021-02-15 ENCOUNTER — Telehealth: Payer: Self-pay | Admitting: *Deleted

## 2021-02-15 ENCOUNTER — Other Ambulatory Visit: Payer: Self-pay | Admitting: *Deleted

## 2021-02-15 ENCOUNTER — Encounter (HOSPITAL_COMMUNITY)
Admission: RE | Disposition: A | Discharge status: Home Health | Payer: Self-pay | Source: Home / Self Care | Attending: Thoracic Surgery (Cardiothoracic Vascular Surgery)

## 2021-02-15 ENCOUNTER — Encounter (HOSPITAL_COMMUNITY): Payer: Self-pay | Admitting: Thoracic Surgery (Cardiothoracic Vascular Surgery)

## 2021-02-15 ENCOUNTER — Ambulatory Visit (HOSPITAL_COMMUNITY)
Admission: RE | Admit: 2021-02-15 | Discharge: 2021-02-15 | Disposition: A | Discharge status: Home Health | Payer: BC Managed Care – PPO | Attending: Thoracic Surgery (Cardiothoracic Vascular Surgery) | Admitting: Thoracic Surgery (Cardiothoracic Vascular Surgery)

## 2021-02-15 ENCOUNTER — Other Ambulatory Visit: Payer: Self-pay | Admitting: Physical Medicine and Rehabilitation

## 2021-02-15 ENCOUNTER — Ambulatory Visit (HOSPITAL_COMMUNITY): Payer: BC Managed Care – PPO | Admitting: Vascular Surgery

## 2021-02-15 ENCOUNTER — Other Ambulatory Visit: Payer: Self-pay

## 2021-02-15 ENCOUNTER — Ambulatory Visit (HOSPITAL_COMMUNITY): Payer: BC Managed Care – PPO | Admitting: Anesthesiology

## 2021-02-15 ENCOUNTER — Ambulatory Visit (HOSPITAL_COMMUNITY): Payer: BC Managed Care – PPO

## 2021-02-15 DIAGNOSIS — Z9049 Acquired absence of other specified parts of digestive tract: Secondary | ICD-10-CM | POA: Insufficient documentation

## 2021-02-15 DIAGNOSIS — N2889 Other specified disorders of kidney and ureter: Secondary | ICD-10-CM | POA: Insufficient documentation

## 2021-02-15 DIAGNOSIS — Z79899 Other long term (current) drug therapy: Secondary | ICD-10-CM | POA: Insufficient documentation

## 2021-02-15 DIAGNOSIS — Z85038 Personal history of other malignant neoplasm of large intestine: Secondary | ICD-10-CM | POA: Insufficient documentation

## 2021-02-15 DIAGNOSIS — C7802 Secondary malignant neoplasm of left lung: Secondary | ICD-10-CM | POA: Insufficient documentation

## 2021-02-15 DIAGNOSIS — R590 Localized enlarged lymph nodes: Secondary | ICD-10-CM | POA: Insufficient documentation

## 2021-02-15 DIAGNOSIS — J910 Malignant pleural effusion: Secondary | ICD-10-CM | POA: Insufficient documentation

## 2021-02-15 DIAGNOSIS — C799 Secondary malignant neoplasm of unspecified site: Secondary | ICD-10-CM

## 2021-02-15 DIAGNOSIS — C782 Secondary malignant neoplasm of pleura: Secondary | ICD-10-CM | POA: Diagnosis not present

## 2021-02-15 DIAGNOSIS — I10 Essential (primary) hypertension: Secondary | ICD-10-CM | POA: Insufficient documentation

## 2021-02-15 DIAGNOSIS — Z419 Encounter for procedure for purposes other than remedying health state, unspecified: Secondary | ICD-10-CM

## 2021-02-15 DIAGNOSIS — C7801 Secondary malignant neoplasm of right lung: Secondary | ICD-10-CM | POA: Insufficient documentation

## 2021-02-15 DIAGNOSIS — Z791 Long term (current) use of non-steroidal anti-inflammatories (NSAID): Secondary | ICD-10-CM | POA: Insufficient documentation

## 2021-02-15 DIAGNOSIS — J90 Pleural effusion, not elsewhere classified: Secondary | ICD-10-CM

## 2021-02-15 DIAGNOSIS — Z87891 Personal history of nicotine dependence: Secondary | ICD-10-CM | POA: Insufficient documentation

## 2021-02-15 HISTORY — PX: CHEST TUBE INSERTION: SHX231

## 2021-02-15 SURGERY — INSERTION, PLEURAL DRAINAGE CATHETER
Anesthesia: Monitor Anesthesia Care | Site: Chest | Laterality: Right

## 2021-02-15 MED ORDER — OXYCODONE HCL 5 MG PO TABS: 5.0000 mg | ORAL_TABLET | Freq: Four times a day (QID) | ORAL | Status: DC | PRN

## 2021-02-15 MED ORDER — MIDAZOLAM HCL 2 MG/2ML IJ SOLN
INTRAMUSCULAR | Status: AC
  Filled 2021-02-15: qty 2

## 2021-02-15 MED ORDER — OXYCODONE HCL 5 MG PO TABS: 5.0000 mg | ORAL_TABLET | Freq: Four times a day (QID) | ORAL | 0 refills | Status: AC | PRN

## 2021-02-15 MED ORDER — LACTATED RINGERS IV SOLN: INTRAVENOUS | Status: DC | PRN

## 2021-02-15 MED ORDER — CHLORHEXIDINE GLUCONATE 0.12 % MT SOLN: 15.0000 mL | Freq: Once | OROMUCOSAL | Status: AC

## 2021-02-15 MED ORDER — ACETAMINOPHEN 325 MG PO TABS: 650.0000 mg | ORAL_TABLET | Freq: Four times a day (QID) | ORAL | Status: DC | PRN

## 2021-02-15 MED ORDER — PROPOFOL 10 MG/ML IV BOLUS
INTRAVENOUS | Status: AC
  Filled 2021-02-15: qty 20

## 2021-02-15 MED ORDER — FENTANYL CITRATE (PF) 250 MCG/5ML IJ SOLN
INTRAMUSCULAR | Status: AC
  Filled 2021-02-15: qty 5

## 2021-02-15 MED ORDER — PHENYLEPHRINE 40 MCG/ML (10ML) SYRINGE FOR IV PUSH (FOR BLOOD PRESSURE SUPPORT)
PREFILLED_SYRINGE | INTRAVENOUS | Status: AC
  Filled 2021-02-15: qty 10

## 2021-02-15 MED ORDER — FENTANYL CITRATE (PF) 100 MCG/2ML IJ SOLN
INTRAMUSCULAR | Status: DC | PRN
  Administered 2021-02-15 (×3): 25 ug via INTRAVENOUS

## 2021-02-15 MED ORDER — CEFAZOLIN SODIUM-DEXTROSE 2-4 GM/100ML-% IV SOLN
2.0000 g | INTRAVENOUS | Status: AC
  Administered 2021-02-15: 2 g via INTRAVENOUS
  Filled 2021-02-15: qty 100

## 2021-02-15 MED ORDER — 0.9 % SODIUM CHLORIDE (POUR BTL) OPTIME
TOPICAL | Status: DC | PRN
  Administered 2021-02-15: 1000 mL

## 2021-02-15 MED ORDER — ONDANSETRON HCL 4 MG/2ML IJ SOLN
INTRAMUSCULAR | Status: AC
  Filled 2021-02-15: qty 2

## 2021-02-15 MED ORDER — MIDAZOLAM HCL 5 MG/5ML IJ SOLN
INTRAMUSCULAR | Status: DC | PRN
  Administered 2021-02-15: 2 mg via INTRAVENOUS

## 2021-02-15 MED ORDER — CELECOXIB 200 MG PO CAPS: 200.0000 mg | ORAL_CAPSULE | Freq: Once | ORAL | Status: DC

## 2021-02-15 MED ORDER — FENTANYL CITRATE (PF) 100 MCG/2ML IJ SOLN: 25.0000 ug | INTRAMUSCULAR | Status: DC | PRN

## 2021-02-15 MED ORDER — LIDOCAINE HCL (PF) 1 % IJ SOLN
INTRAMUSCULAR | Status: AC
  Filled 2021-02-15: qty 30

## 2021-02-15 MED ORDER — BUPIVACAINE HCL (PF) 0.25 % IJ SOLN
INTRAMUSCULAR | Status: AC
  Filled 2021-02-15: qty 30

## 2021-02-15 MED ORDER — ACETAMINOPHEN 500 MG PO TABS: 1000.0000 mg | ORAL_TABLET | Freq: Once | ORAL | Status: DC

## 2021-02-15 MED ORDER — ORAL CARE MOUTH RINSE: 15.0000 mL | Freq: Once | OROMUCOSAL | Status: AC

## 2021-02-15 MED ORDER — ONDANSETRON HCL 4 MG/2ML IJ SOLN
INTRAMUSCULAR | Status: DC | PRN
  Administered 2021-02-15: 4 mg via INTRAVENOUS

## 2021-02-15 MED ORDER — CHLORHEXIDINE GLUCONATE 0.12 % MT SOLN
OROMUCOSAL | Status: AC
  Administered 2021-02-15: 15 mL via OROMUCOSAL
  Filled 2021-02-15: qty 15

## 2021-02-15 MED ORDER — LACTATED RINGERS IV SOLN: INTRAVENOUS | Status: DC

## 2021-02-15 MED ORDER — PROPOFOL 10 MG/ML IV BOLUS
INTRAVENOUS | Status: DC | PRN
  Administered 2021-02-15: 20 mg via INTRAVENOUS

## 2021-02-15 MED ORDER — LIDOCAINE HCL 1 % IJ SOLN
INTRAMUSCULAR | Status: DC | PRN
  Administered 2021-02-15: 16 mL

## 2021-02-15 MED ORDER — PROPOFOL 500 MG/50ML IV EMUL
INTRAVENOUS | Status: DC | PRN
  Administered 2021-02-15: 75 ug/kg/min via INTRAVENOUS

## 2021-02-15 SURGICAL SUPPLY — 29 items
BLADE CLIPPER SURG (BLADE) ×2 IMPLANT
CANISTER SUCT 3000ML PPV (MISCELLANEOUS) ×2 IMPLANT
COVER SURGICAL LIGHT HANDLE (MISCELLANEOUS) ×2 IMPLANT
DERMABOND ADVANCED (GAUZE/BANDAGES/DRESSINGS) ×1
DERMABOND ADVANCED .7 DNX12 (GAUZE/BANDAGES/DRESSINGS) ×1 IMPLANT
DRAPE C-ARM 42X72 X-RAY (DRAPES) ×2 IMPLANT
DRAPE LAPAROSCOPIC ABDOMINAL (DRAPES) ×2 IMPLANT
GLOVE SURG ENC MOIS LTX SZ7 (GLOVE) ×2 IMPLANT
GLOVE SURG SIGNA 7.5 PF LTX (GLOVE) ×4 IMPLANT
GLOVE SURG UNDER POLY LF SZ6 (GLOVE) ×2 IMPLANT
GOWN STRL REUS W/ TWL LRG LVL3 (GOWN DISPOSABLE) ×1 IMPLANT
GOWN STRL REUS W/ TWL XL LVL3 (GOWN DISPOSABLE) ×1 IMPLANT
GOWN STRL REUS W/TWL LRG LVL3 (GOWN DISPOSABLE) ×1
GOWN STRL REUS W/TWL XL LVL3 (GOWN DISPOSABLE) ×1
KIT BASIN OR (CUSTOM PROCEDURE TRAY) ×2 IMPLANT
KIT PLEURX DRAIN CATH 1000ML (MISCELLANEOUS) ×6 IMPLANT
KIT PLEURX DRAIN CATH 15.5FR (DRAIN) ×4 IMPLANT
KIT TURNOVER KIT B (KITS) ×2 IMPLANT
NS IRRIG 1000ML POUR BTL (IV SOLUTION) ×2 IMPLANT
PACK GENERAL/GYN (CUSTOM PROCEDURE TRAY) ×2 IMPLANT
PAD ARMBOARD 7.5X6 YLW CONV (MISCELLANEOUS) ×4 IMPLANT
SET DRAINAGE LINE (MISCELLANEOUS) IMPLANT
SUT ETHILON 3 0 FSL (SUTURE) ×4 IMPLANT
SUT VIC AB 3-0 X1 27 (SUTURE) ×4 IMPLANT
SYR CONTROL 10ML LL (SYRINGE) ×2 IMPLANT
TOWEL GREEN STERILE (TOWEL DISPOSABLE) ×2 IMPLANT
TOWEL GREEN STERILE FF (TOWEL DISPOSABLE) ×2 IMPLANT
VALVE REPLACEMENT CAP (MISCELLANEOUS) IMPLANT
WATER STERILE IRR 1000ML POUR (IV SOLUTION) ×2 IMPLANT

## 2021-02-15 NOTE — Progress Notes (Signed)
SATURATION QUALIFICATIONS: (This note is used to comply with regulatory documentation for home oxygen)  Patient Saturations on Room Air at Rest =88%  Patient Saturations on 2 Liters of oxygen while Ambulating = 94%  Please briefly explain why patient needs home oxygen: lung cancer

## 2021-02-15 NOTE — Discharge Instructions (Addendum)
Do not drive or engage in heavy physical activity for 24 hours.  You may resume normal activities tomorrow. You may shower tomorrow. Leave dressing in place when you shower.  A home health nurse will be arranged to assist with catheter drainage and care.  You have a prescription for oxycodone, a narcotic pain reliever. You may use as directed. You may use acetaminophen (Tylenol) in addition to, or instead of the oxycodone.  Call [phone] if you develop chest pain, shortness of breath, fever > 101 F or notice excessive pain, tenderness, redness or drainage around the incision.  My office will contact you with a follow up appointment.

## 2021-02-15 NOTE — Transfer of Care (Signed)
Immediate Anesthesia Transfer of Care Note  Patient: [name]  Procedure(s) Performed: INSERTION PLEURAL DRAINAGE CATHETER (Right Chest)  Patient Location: PACU  Anesthesia Type:MAC  Level of Consciousness: awake, alert  and oriented  Airway & Oxygen Therapy: Patient Spontanous Breathing and Patient connected to face mask oxygen  Post-op Assessment: Report given to RN and Post -op Vital signs reviewed and stable  Post vital signs: Reviewed and stable  Last Vitals:  Vitals Value Taken Time  BP 137/84 02/15/21 1011  Temp    Pulse 111 02/15/21 1013  Resp 36 02/15/21 1013  SpO2 97 % 02/15/21 1013  Vitals shown include unvalidated device data.  Last Pain:  Vitals:   02/15/21 0821  TempSrc:   PainSc: 0-No pain         Complications: No complications documented.

## 2021-02-15 NOTE — Anesthesia Preprocedure Evaluation (Addendum)
Anesthesia Evaluation  Patient identified by MRN, date of birth, ID band Patient awake    Reviewed: Allergy & Precautions, NPO status , Patient's Chart, lab work & pertinent test results  History of Anesthesia Complications Negative for: history of anesthetic complications  Airway Mallampati: II  TM Distance: >3 FB Neck ROM: Full    Dental  (+) Edentulous Lower, Edentulous Upper, Dental Advisory Given   Pulmonary former smoker,  Pleural effusion    + decreased breath sounds      Cardiovascular hypertension, Pt. on medications + Valvular Problems/Murmurs  Rhythm:Regular Rate:Tachycardia  1. 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 There is no outflow tract obstruction. Left ventricular  ejection fraction, by estimation, is >75%. The left ventricle has  hyperdynamic function. The left ventricle has  no regional wall motion abnormalities. There is moderate asymmetric left  ventricular hypertrophy of the apical segment. Left ventricular diastolic  parameters are consistent with Grade I diastolic dysfunction (impaired  relaxation). The average left  ventricular global longitudinal strain is -20.1 %. The global longitudinal  strain is normal.  2. Right ventricular systolic function is hyperdynamic. The right  ventricular size is normal. There is moderately elevated pulmonary artery  systolic pressure.  3. A small pericardial effusion is present. The pericardial effusion is  circumferential. There is no evidence of cardiac tamponade.  4. The mitral valve is normal in structure. No evidence of mitral valve  regurgitation.  5. Tricuspid valve regurgitation is moderate.  6. The aortic valve is tricuspid. Aortic valve regurgitation is not  visualized. No aortic stenosis is present.  7. Aortic dilatation noted. There is mild dilatation of the aortic root,  measuring 41 mm. There is mild  dilatation of the ascending aorta,  measuring 40 mm.  8. No thrombus/mass is seen in the inferior vena cava. The inferior vena  cava is normal in size with greater than 50% respiratory variability,  suggesting right atrial pressure of 3 mmHg.    Neuro/Psych negative neurological ROS  negative psych ROS   GI/Hepatic negative GI ROS, Neg liver ROS,   Endo/Other  negative endocrine ROS  Renal/GU negative Renal ROSMetastatic Renal Cell Ca  negative genitourinary   Musculoskeletal negative musculoskeletal ROS (+)   Abdominal   Peds negative pediatric ROS (+)  Hematology negative hematology ROS (+) anemia ,   Anesthesia Other Findings   Reproductive/Obstetrics negative OB ROS                           Anesthesia Physical Anesthesia Plan  ASA: III  Anesthesia Plan: MAC   Post-op Pain Management:    Induction: Intravenous  PONV Risk Score and Plan: Ondansetron and Dexamethasone  Airway Management Planned: Natural Airway  Additional Equipment:   Intra-op Plan:   Post-operative Plan:   Informed Consent: I have reviewed the patients History and Physical, chart, labs and discussed the procedure including the risks, benefits and alternatives for the proposed anesthesia with the patient or authorized representative who has indicated his/her understanding and acceptance.     Dental advisory given  Plan Discussed with: Anesthesiologist, CRNA and Surgeon  Anesthesia Plan Comments:        Anesthesia Quick Evaluation

## 2021-02-15 NOTE — Interval H&P Note (Signed)
History and Physical Interval Note:  CXR shows progression of pleural effusion with a loculated superior component. Not clear if pleural catheter will completely drain this area, but still best option for initial management   02/15/2021 8:37 AM  [name]  has presented today for surgery, with the diagnosis of Malignant right pleural effusion.  The various methods of treatment have been discussed with the patient and family. After consideration of risks, benefits and other options for treatment, the patient has consented to  Procedure(s): INSERTION PLEURAL DRAINAGE CATHETER (Right) as a surgical intervention.  The patient's history has been reviewed, patient examined, no change in status, stable for surgery.  I have reviewed the patient's chart and labs.  Questions were answered to the patient's satisfaction.     Melrose Nakayama

## 2021-02-15 NOTE — Anesthesia Procedure Notes (Signed)
Procedure Name: MAC Date/Time: 02/15/2021 9:07 AM Performed by: Inda Coke, CRNA Pre-anesthesia Checklist: Patient identified, Emergency Drugs available, Suction available, Timeout performed and Patient being monitored Patient Re-evaluated:Patient Re-evaluated prior to induction Oxygen Delivery Method: Simple face mask Induction Type: IV induction Dental Injury: Teeth and Oropharynx as per pre-operative assessment

## 2021-02-15 NOTE — Brief Op Note (Signed)
02/15/2021  10:25 AM  PATIENT:  [name]  55 y.o. male  PRE-OPERATIVE DIAGNOSIS:  Malignant right pleural effusion  POST-OPERATIVE DIAGNOSIS:  Malignant right pleural effusion  PROCEDURE:  Procedure(s): INSERTION PLEURAL DRAINAGE CATHETER (Right)  SURGEON:  Surgeon(s) and Role:    * Melrose Nakayama, MD - Primary  PHYSICIAN ASSISTANT:   ASSISTANTS: none   ANESTHESIA:   local and IV sedation  EBL:  2 mL   BLOOD ADMINISTERED:none  DRAINS: Right pleural catheter   LOCAL MEDICATIONS USED:  LIDOCAINE  and Amount: 15 ml  SPECIMEN:  No Specimen  DISPOSITION OF SPECIMEN:  N/A  COUNTS:  YES  TOURNIQUET:  * No tourniquets in log *  DICTATION: .Other Dictation: Dictation Number -  PLAN OF CARE: Discharge to home after PACU  PATIENT DISPOSITION:  PACU - hemodynamically stable.   Delay start of Pharmacological VTE agent (>24hrs) due to surgical blood loss or risk of bleeding: not applicable

## 2021-02-15 NOTE — TOC Initial Note (Addendum)
Transition of Care [facility]) - Initial/Assessment Note    Patient Details  Name: [name] MRN: [id number] Date of Birth: [birth year]  [facility] of [facility] CM/SW Contact:    Erenest Rasher, RN Phone Number: [phone] 02/15/2021, 1:35 PM  Clinical Narrative:                 TOC CM contacted pt and states he lives with his brother and sister at home. He will be able to drain his catheter until [facility] starts. [place] for oxygen for home. They will deliver a portable to his room and concentrator to his home. [place] [phone] fax [phone] with new referral for [facility] RN. States they can do a start of care for Wednesday. Faxed orders, progress notes and facesheet to intake. Pt was provided drain bottles to use at home. TOC CM office CMA, Sydell Axon will fax referral to [facility] and mail orders to provider to have bottles delivered to home. Message sent to PACU RN to provide pt with a copy of orders with contact number listed on forms to call to follow up.   TOC CM contacted attending for orders for oxygen.   Expected Discharge Plan: [place] Barriers to Discharge: No Barriers Identified   Patient Goals and CMS Choice   CMS Medicare.gov Compare Post Acute Care list provided to:: Patient Choice offered to / list presented to : Patient  Expected Discharge Plan and Services Expected Discharge Plan: [place] In-house Referral: Clinical Social Work Discharge Planning Services: CM Consult Post Acute Care Choice: [place] arrangements for the past 2 months: Single Family Home Expected Discharge Date: 02/15/21               DME Arranged: Oxygen DME Agency: [employer] Date DME Agency Contacted: 02/15/21 Time DME Agency Contacted: 20 Representative spoke with at DME Agency: Dawayne Patricia HH Arranged: RN [place] Agency: Other - See comment ([place]) Date [place]: 02/15/21 Time [place]: 1321 Representative spoke with at [place]: Intake  Prior Living Arrangements/Services Living arrangements for the past 2 months: [place] with:: Siblings Patient language and need for interpreter reviewed:: Yes Do you feel safe going back to the place where you live?: Yes      Need for Family Participation in Patient Care: No (Comment) Care giver support system in place?: No (comment)   Criminal Activity/Legal Involvement Pertinent to Current Situation/Hospitalization: No - Comment as needed  Activities of Daily Living      Permission Sought/Granted Permission sought to share information with : Case Manager Permission granted to share information with : Yes, Verbal Permission Granted              Emotional Assessment       Orientation: : Oriented to Self,Oriented to Place,Oriented to  Time,Oriented to Situation   Psych Involvement: No (comment)  Admission diagnosis:  Malignant right pleural effusion Patient Active Problem List   Diagnosis Date Noted  . Pleural effusion, malignant 01/28/2021  . Unintentional weight loss of more than 10 pounds 01/28/2021  . Pleural effusion on right 01/23/2021  . History of colon cancer 01/23/2021  . Essential hypertension 01/23/2021  . Metastatic carcinoma ([place]) 01/23/2021  . Acute respiratory failure with hypoxia ([place]) 01/23/2021  . Pericardial effusion 01/23/2021   PCP:  Glenda Chroman, MD Pharmacy:   [id number] - [place], [address]  [place] [address] Phone: [phone] Fax: [phone]  [place], [address] [employer] [place] [address] Phone: [phone] Fax: [phone]     Social Determinants of Health (SDOH) Interventions    Readmission Risk Interventions No flowsheet data found.

## 2021-02-15 NOTE — Anesthesia Postprocedure Evaluation (Signed)
Anesthesia Post Note  Patient: [name], Thomas (consulting)  Procedure(s) Performed: INSERTION PLEURAL DRAINAGE CATHETER (Right Chest)     Patient location during evaluation: PACU Anesthesia Type: MAC Level of consciousness: awake and alert Pain management: pain level controlled Vital Signs Assessment: post-procedure vital signs reviewed and stable Respiratory status: spontaneous breathing and respiratory function stable Cardiovascular status: stable Postop Assessment: no apparent nausea or vomiting Anesthetic complications: no   No complications documented.  Last Vitals:  Vitals:   02/15/21 1241 02/15/21 1256  BP: 109/70 105/63  Pulse: 94 94  Resp: (!) 27 (!) 27  Temp:    SpO2: 94% 94%    Last Pain:  Vitals:   02/15/21 1241  TempSrc:   PainSc: 0-No pain                 [name]

## 2021-02-15 NOTE — Telephone Encounter (Signed)
Received call from [facility] in PACU.  Patient had right pleural catheter placed today.  He is currently registering on room air at 88%.  Dr Roxan Hockey requested that patient receive home oxygen.    Natalie from PACU will make appropriate notation.    Called [employer], lm for Thedore Mins to call back to see if we can get Oxygen delivered to [facility] PACU today.  Pending call back.

## 2021-02-16 ENCOUNTER — Ambulatory Visit: Payer: BC Managed Care – PPO | Admitting: Family Medicine

## 2021-02-16 ENCOUNTER — Encounter (HOSPITAL_COMMUNITY): Payer: Self-pay | Admitting: Thoracic Surgery (Cardiothoracic Vascular Surgery)

## 2021-02-16 NOTE — Op Note (Signed)
[name], [name] MEDICAL RECORD NO: [id number] ACCOUNT NO: [id number] DATE OF BIRTH: 09-02-1966 FACILITY: [facility] LOCATION: [facility] PHYSICIAN: Revonda Standard. Roxan Hockey, MD  Operative Report   DATE OF PROCEDURE: 02/15/2021   PREOPERATIVE DIAGNOSIS:  Malignant right pleural effusion.  POSTOPERATIVE DIAGNOSIS:  Malignant right pleural effusion.  PROCEDURE:  Placement of right pleural catheter.  SURGEON:  Revonda Standard. Roxan Hockey, MD  ASSISTANT:  None.  ANESTHESIA USED:  Local with intravenous sedation.  FINDINGS:  Initial catheter placement drained almost a liter of fluid, but did not appear to connect with the main fluid collection.  Second placement drained about 800 mL good position of the catheter.  CLINICAL NOTE: [name] is a 55 year old man with a history of metastatic renal cell carcinoma with a malignant right pleural effusion.  He had multiple thoracenteses within a relatively short period of time, but had rapid recurrence of his effusion each  time.  He was offered the option of pleural drainage catheter placement for management of the effusions.  He understood this was palliative and not therapeutic.  The indications, risks, benefits, and alternatives were discussed in detail with the  patient.  He understood and accepted the risks and agreed to proceed.  DESCRIPTION OF PROCEDURE: [name] was brought to the operating room on 02/15/2021.  He was given intravenous sedation and monitored by the anesthesia service.  Sequential compression devices were placed on the calves for DVT prophylaxis.  A Bair  Hugger was placed for active warming.  Intravenous antibiotics were administered.  A timeout was performed.  The sites for placement of the catheter and the catheter exit site were marked and these areas were anesthetized with 1% lidocaine.  The subcutaneous tissue between the two sires was anesthetized as well.  Incision was  made in the lateral chest wall and the right  pleural effusion was accessed using a modified Seldinger technique, the wire was advanced.  Positioning of the wire was rather basilar and the wire did kink, but there was opacity in that area on the  fluoroscopy, and it appeared to be intrapleural.  The catheter then was tunneled from the exit site to the insertion site, with a cuff the catheter left just under the skin at the exit site.  The tract over the wire was dilated and a peel-away sheath introducer  was placed.  The catheter was advanced into the chest and connected to a suction canister.  The catheter appeared to be coiled and kinked on fluoroscopy.  Approximately 800 mL of dark fluid drained.  There was additional undrained  fluid laterally and superiorly.  The decision was made to replace the catheter one interspace higher. This interspace was numbed and then also accessed using a Seldinger technique.  At this time, the catheter advanced more laterally and up to the  superior portion of the chest near the apex.  After connecting the catheter to suction, an additional 600-700 mL of fluid was evacuated.  There was improved aeration at the base by fluoroscopy, there was some residual fluid loculated superiorly.  The catheter was  secured at the exit site from the skin with a 3-0 nylon suture.  The lateral incision was closed with a 3-0 Vicryl subcuticular suture.  Dermabond was applied.  The catheter was capped and a dressing was applied.  The patient was taken from the operating room to the postanesthetic care unit in good condition.   Counts were correct at the end of the procedure.  Fluoroscopy time was less than  a minute.   SUJ D: 02/15/2021 6:04:52 pm T: 02/16/2021 7:26:00 am  JOB: [id number]/ [id number]

## 2021-02-22 ENCOUNTER — Other Ambulatory Visit: Payer: Self-pay

## 2021-02-22 ENCOUNTER — Inpatient Hospital Stay (HOSPITAL_BASED_OUTPATIENT_CLINIC_OR_DEPARTMENT_OTHER): Payer: BC Managed Care – PPO | Admitting: Hematology and Oncology

## 2021-02-22 ENCOUNTER — Inpatient Hospital Stay: Payer: BC Managed Care – PPO | Admitting: Nutrition

## 2021-02-22 ENCOUNTER — Other Ambulatory Visit (HOSPITAL_COMMUNITY): Payer: Self-pay

## 2021-02-22 ENCOUNTER — Inpatient Hospital Stay: Payer: BC Managed Care – PPO

## 2021-02-22 VITALS — BP 138/70 | HR 108 | Temp 97.3°F | Resp 20 | Ht 72.0 in | Wt 176.3 lb

## 2021-02-22 DIAGNOSIS — C799 Secondary malignant neoplasm of unspecified site: Secondary | ICD-10-CM

## 2021-02-22 DIAGNOSIS — J910 Malignant pleural effusion: Secondary | ICD-10-CM

## 2021-02-22 DIAGNOSIS — R634 Abnormal weight loss: Secondary | ICD-10-CM

## 2021-02-22 DIAGNOSIS — C642 Malignant neoplasm of left kidney, except renal pelvis: Secondary | ICD-10-CM | POA: Diagnosis not present

## 2021-02-22 LAB — CBC WITH DIFFERENTIAL/PLATELET
Abs Immature Granulocytes: 0.03 10*3/uL (ref 0.00–0.07)
Basophils Absolute: 0 10*3/uL (ref 0.0–0.1)
Basophils Relative: 0 %
Eosinophils Absolute: 0 10*3/uL (ref 0.0–0.5)
Eosinophils Relative: 0 %
HCT: 25.6 % — ABNORMAL LOW (ref 39.0–52.0)
Hemoglobin: 7.9 g/dL — ABNORMAL LOW (ref 13.0–17.0)
Immature Granulocytes: 0 %
Lymphocytes Relative: 11 %
Lymphs Abs: 0.9 10*3/uL (ref 0.7–4.0)
MCH: 24.8 pg — ABNORMAL LOW (ref 26.0–34.0)
MCHC: 30.9 g/dL (ref 30.0–36.0)
MCV: 80.5 fL (ref 80.0–100.0)
Monocytes Absolute: 0.7 10*3/uL (ref 0.1–1.0)
Monocytes Relative: 8 %
Neutro Abs: 6.6 10*3/uL (ref 1.7–7.7)
Neutrophils Relative %: 81 %
Platelets: 591 10*3/uL — ABNORMAL HIGH (ref 150–400)
RBC: 3.18 MIL/uL — ABNORMAL LOW (ref 4.22–5.81)
RDW: 14.8 % (ref 11.5–15.5)
WBC: 8.2 10*3/uL (ref 4.0–10.5)
nRBC: 0 % (ref 0.0–0.2)

## 2021-02-22 LAB — CMP (CANCER CENTER ONLY)
ALT: 45 U/L — ABNORMAL HIGH (ref 0–44)
AST: 43 U/L — ABNORMAL HIGH (ref 15–41)
Albumin: 2.1 g/dL — ABNORMAL LOW (ref 3.5–5.0)
Alkaline Phosphatase: 79 U/L (ref 38–126)
Anion gap: 11 (ref 5–15)
BUN: 15 mg/dL (ref 6–20)
CO2: 23 mmol/L (ref 22–32)
Calcium: 9 mg/dL (ref 8.9–10.3)
Chloride: 105 mmol/L (ref 98–111)
Creatinine: 1.52 mg/dL — ABNORMAL HIGH (ref 0.61–1.24)
GFR, Estimated: 54 mL/min — ABNORMAL LOW (ref >60)
Glucose, Bld: 107 mg/dL — ABNORMAL HIGH (ref 70–99)
Potassium: 4.9 mmol/L (ref 3.5–5.1)
Sodium: 139 mmol/L (ref 135–145)
Total Bilirubin: 0.4 mg/dL (ref 0.3–1.2)
Total Protein: 7 g/dL (ref 6.5–8.1)

## 2021-02-22 NOTE — Progress Notes (Signed)
55 year old male diagnosed with metastatic renal cell carcinoma and pleural effusion.  He is a patient of Dr. Chryl Heck.  Past medical history includes colon cancer in 2015 status post hemicolectomy.  He also has a history of GIB, hypercholesterolemia, and hypertension.  Medications include Zofran, Protonix, Senokot.  Labs include glucose 107, BUN 15, creatinine 1.52, albumin 2.1.  Height: 6 feet 0 inches. Weight: 176.3 pounds. Usual body weight: 212 pounds in October 2021.  Patient weighed 205 pounds in January 2022. BMI: 23.91.  Patient reports his appetite is about the same.  He complains of nausea contributing to decreased oral intake.  He endorses a 40 pound weight loss.  He reports he tolerates a variety of foods but he just cannot eat very much.  Complains of increased pain.  Reports normal BM daily.  He does drink oral nutrition supplements but has had some difficulty tolerating Ensure and boost.  Nutrition diagnosis: Unintended weight loss related to inadequate oral intake as evidenced by 14% weight loss over 4 months which is significant.  Intervention: Educated patient to consume smaller more frequent meals and snacks throughout the day.  Educated on high-protein high-calorie choices.  Provided nutrition fact sheets. Encourage patient to try a variety of clear oral nutrition supplements and provided samples.  Also gave education on appropriate use of milk-based oral nutrition supplements for better tolerance. Questions were answered.  Teach back method used.  Contact information provided.  Monitoring, evaluation, goals: Patient will tolerate adequate calories and protein to minimize weight loss.  Next visit: Tuesday, May 17 after MD visit.  **Disclaimer: This note was dictated with voice recognition software. Similar sounding words can inadvertently be transcribed and this note may contain transcription errors which may not have been corrected upon publication of note.**

## 2021-02-22 NOTE — Assessment & Plan Note (Signed)
Weight has been stable for the past 2 weeks. Recommend small meals multiple times We will continue to monitor the diet.

## 2021-02-22 NOTE — Assessment & Plan Note (Signed)
He is s.p right pleurex catheter placement. He has [place] nurse to assist with drainage Now on 2 L oxygen.

## 2021-02-22 NOTE — Assessment & Plan Note (Addendum)
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 Given papillary sub type, we have discussed front line cabozantinib vs caboxantinib plus nivolumab.  Given his PS, we will start with single agent cabozantinib. Discussed mechanism of action and adverse effects of cabozantinib. He understands the intent of treatment is palliative. He will start cabazantinib as soon as he receives in the mail. HE will RTC in 2/3 weeks.

## 2021-02-22 NOTE — Progress Notes (Signed)
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 Given papillary sub type, we have discussed front line cabozantinib vs caboxantinib plus nivolumab.  Given his PS, we will start with single agent cabozantinib. Discussed mechanism of action and adverse effects of cabozantinib. He understands the intent of treatment is palliative. He will start cabazantinib as soon as he receives in the mail. HE will RTC in 2/3 weeks.  He under    Pleural effusion, malignant He is s.p right pleurex catheter placement. He has [place] nurse to assist with drainage Now on 2 L oxygen.   Unintentional weight loss of more than 10 pounds Weight has been stable for the past 2 weeks. Recommend small meals multiple times We will continue to monitor the diet.  Orders Placed This Encounter  Procedures  . MR Brain W Contrast    Standing Status:   Future    Standing Expiration Date:   02/22/2022    Order Specific Question:   If indicated for the ordered procedure, I authorize the administration of contrast media per Radiology protocol    Answer:   Yes    Order Specific Question:   What is the patient's sedation requirement?    Answer:   No Sedation    Order Specific Question:   Does the patient have a pacemaker or implanted devices?    Answer:   No    Order Specific Question:   Use SRS Protocol?    Answer:   No    Order Specific Question:   Preferred imaging location?    Answer:   [facility] (table limit - 550 lbs)   . CBC with Differential/Platelet    Standing Status:   Standing    Number of Occurrences:   22    Standing Expiration Date:   02/22/2022  . CMP (Cancer Center only)    Standing Status:   Future    Number of Occurrences:   1    Standing Expiration Date:   02/22/2022     HISTORY OF PRESENTING ILLNESS:   [name] 55 y.o. male is here because of concern for metastatic renal cell carcinoma.  Oncology History Overview Note  [name] is a 55 year old male with a past medical history significant for colon cancer (status post hemicolectomy in 2015 -previously followed by Dr. Jacquiline Doe at [place]), hyperlipidemia, hypertension.  The patient was transferred from [facility] emergency department to [facility] due to acute hypoxic respiratory failure.  The patient has a 3-week history of cough which progressively worsened.  He subsequently developed shortness of breath. He has a recent 15-20 pound unintentional weight loss in the past few weeks and decreased oral intake due to lack of appetite and generalized weakness.  CT chest with contrast was performed on 01/22/2021 at [facility] and reviewed through care everywhere.  This showed a large solid heterogeneous mass arising off the upper pole of the left kidney consistent with primary renal cell carcinoma, tumor extension into the left renal vein identified, left retroperitoneal metastatic adenopathy, extensive nodal  metastasis within the mediastinum and bilateral hilar regions, bilateral pleural metastasis with large malignant right pleural effusion, multiple pulmonary nodules identified compatible with pulmonary metastasis, small to moderate pericardial effusion.  The patient had a CT abdomen/pelvis performed 01/23/2021 which showed a 15.1 cm left upper pole renal mass compatible with advanced renal cell carcinoma, expansile tumor thrombus the left renal vein, so see the left para-aortic nodal metastases, small hepatic metastases,  bilateral pulmonary metastases, extensive pleural metastases in the right hemithorax with moderate to large pleural effusion, thoracic nodal metastases.  The patient had ultrasound-guided thoracentesis performed 01/23/2021 with 2.5 L of fluid removed and a repeat thoracentesis performed earlier today with 2 L of fluid removed. Fluid from thoracentesis negative for malignant cells.  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    Metastatic renal cell carcinoma ([place])  01/23/2021 Initial Diagnosis   Metastatic renal cell carcinoma (HCC)    Interval History  He is here for a follow up after his biopsy. In the interim he had his right pleurex catheter placed and a nurse is assisting him at home to help with drainage. He is on oxygen using 2 L to help with SOB He feels very tired, continues to lose weight. He otherwise denies any new abdominal complaints No change in urinary habits. No new neurological complaints.  MEDICAL HISTORY:  Past Medical History:  Diagnosis Date  . Anemia   . Cancer [facility])    colon cancer  . Colon cancer ([place])   . Dyspnea   . GIB (gastrointestinal bleeding)   . High cholesterol   . History of colon cancer 01/23/2021  . HTN (hypertension)     SURGICAL HISTORY: Past Surgical History:  Procedure Laterality Date  . CHEST TUBE INSERTION Right 02/15/2021   Procedure: INSERTION PLEURAL DRAINAGE CATHETER;  Surgeon: Melrose Nakayama, MD;  Location: [place];  Service: Thoracic;  Laterality: Right;  . COLON RESECTION    . HEMICOLECTOMY Right   . HERNIA REPAIR    . IR THORACENTESIS ASP PLEURAL SPACE W/IMG GUIDE  01/25/2021    SOCIAL HISTORY: Social History   Socioeconomic History  . Marital status: Single    Spouse name: Not on file  . Number of children: Not on file  . Years of education: Not on file  . Highest education level:  Not on file  Occupational History  . Not on file  Tobacco Use  . Smoking status: Former Smoker    Quit date: 2006    Years since quitting: 16.3  . Smokeless tobacco: Never Used  Vaping Use  . Vaping Use: Never used  Substance and Sexual Activity  . Alcohol use: No  . Drug use: No  . Sexual activity: Not Currently  Other Topics Concern  . Not on file  Social History Narrative  . Not on file   Social Determinants of Health   Financial Resource Strain: Not on file  Food Insecurity: Not on file  Transportation Needs: Not on file  Physical Activity: Not on file  Stress: Not on file  Social Connections: Not on file  Intimate Partner Violence: Not on file    FAMILY HISTORY: Family History  Problem Relation Age of Onset  . Hypertension Mother   . Hypertension Sister   . Hypertension Brother   . Heart attack Cousin   . Cancer Father     ALLERGIES:  has No Known  Allergies.  MEDICATIONS:  Current Outpatient Medications  Medication Sig Dispense Refill  . albuterol (VENTOLIN HFA) 108 (90 Base) MCG/ACT inhaler Inhale 2 puffs into the lungs every 4 (four) hours as needed for shortness of breath or wheezing.    Marland Kitchen amLODipine (NORVASC) 5 MG tablet Take 5 mg by mouth daily.    . meclizine (ANTIVERT) 12.5 MG tablet TAKE 1 TABLET (12.5 MG TOTAL) BY MOUTH 3 (THREE) TIMES DAILY AS NEEDED FOR DIZZINESS. 90 tablet 3  . meloxicam (MOBIC) 7.5 MG tablet Take 7.5 mg by mouth 2 (two) times daily as needed for pain.    Marland Kitchen ondansetron (ZOFRAN) 8 MG tablet Take 1 tablet (8 mg total) by mouth every 8 (eight) hours as needed for nausea. 30 tablet 3  . oxyCODONE (OXY IR/ROXICODONE) 5 MG immediate release tablet Take 1 tablet (5 mg total) by mouth every 6 (six) hours as needed (postop pain). 28 tablet 0  . pantoprazole (PROTONIX) 40 MG tablet Take 40 mg by mouth daily.    . sennosides-docusate sodium (SENOKOT-S) 8.6-50 MG tablet Take 1 tablet by mouth daily.     No current facility-administered  medications for this visit.   PHYSICAL EXAMINATION:  ECOG PERFORMANCE STATUS: 0 - Asymptomatic  Vitals:   02/22/21 1042  BP: 138/70  Pulse: (!) 108  Resp: 20  Temp: (!) 97.3 F (36.3 C)  SpO2: (!) 89%   Filed Weights   02/22/21 1042  Weight: 176 lb 4.8 oz (80 kg)    GENERAL:alert, in mild respiratory distress SKIN: skin color, texture, turgor are normal, no rashes or significant lesions EYES: normal, conjunctiva are pink and non-injected, sclera clear OROPHARYNX:no exudate, no erythema and lips, buccal mucosa, and tongue normal. NECK: supple, thyroid normal size, non-tender, without nodularity LYMPH:  no palpable lymphadenopathy in the cervical, axillary or inguinal LUNGS: Right sided diminished breathing. HEART: regular rate & rhythm and no murmurs and no lower extremity edema. ABDOMEN:abdomen soft, non-tender and normal bowel sounds, undefined palpable abnormality LUQ.NO hepatomegaly. Musculoskeletal:no cyanosis of digits and no clubbing  PSYCH: alert & oriented x 3 with fluent speech NEURO: no focal motor/sensory deficits  LABORATORY DATA:  I have reviewed the data as listed Lab Results  Component Value Date   WBC 8.2 02/22/2021   HGB 7.9 (L) 02/22/2021   HCT 25.6 (L) 02/22/2021   MCV 80.5 02/22/2021   PLT 591 (H) 02/22/2021     Chemistry      Component Value Date/Time   NA 139 02/22/2021 1202   K 4.9 02/22/2021 1202   CL 105 02/22/2021 1202   CO2 23 02/22/2021 1202   BUN 15 02/22/2021 1202   CREATININE 1.52 (H) 02/22/2021 1202      Component Value Date/Time   CALCIUM 9.0 02/22/2021 1202   ALKPHOS 79 02/22/2021 1202   AST 43 (H) 02/22/2021 1202   ALT 45 (H) 02/22/2021 1202   BILITOT 0.4 02/22/2021 1202       RADIOGRAPHIC STUDIES: I have personally reviewed the radiological images as listed and agreed with the findings in the report. DG Chest 1 View  Result Date: 01/25/2021 CLINICAL DATA:  Status post thoracentesis EXAM: CHEST  1 VIEW COMPARISON:   01/24/2021 FINDINGS: Small right pleural effusion with interval decreased compared with 01/24/2021. Right basilar atelectasis. Trace left pleural effusion. No pneumothorax. Stable cardiomediastinal silhouette. No aggressive osseous lesion. IMPRESSION: 1. Small right pleural effusion with interval decreased compared with 01/24/2021. No pneumothorax. Electronically Signed   By: Kathreen Devoid  On: 01/25/2021 11:10   DG Chest 2 View  Result Date: 02/12/2021 CLINICAL DATA:  55 year old male with a history of metastatic disease and malignant right-sided pleural effusion EXAM: CHEST - 2 VIEW COMPARISON:  02/10/2021, 01/28/2021 FINDINGS: Cardiomediastinal silhouette unchanged in size and contour with the right heart border obscured by overlying lung/pleural disease. Increasing peripheral opacities of the right lung, particularly at the apex. Blunting at the left costophrenic angle with small meniscus on the lateral view in the sulcus. No displaced fracture. IMPRESSION: Increased peripheral opacities of the right lung, compatible with increased pleural fluid and associated atelectasis/consolidation, with associated metastatic disease better demonstrated on prior CT imaging. Trace left-sided pleural effusion. Electronically Signed   By: Corrie Mckusick D.O.   On: 02/12/2021 16:20   DG Chest 2 View  Result Date: 02/10/2021 CLINICAL DATA:  Right pleural effusion. Metastatic renal cell carcinoma. EXAM: CHEST - 2 VIEW COMPARISON:  01/18/2021 FINDINGS: Pleural-based densities in the right chest are compatible with pleural fluid and nodular pleural disease. Right pleural effusion has slightly increased from the previous examination. Vague opacities in the periphery of the right lung could be related to pleural fluid and/or compressive atelectasis. Evidence for small left pleural effusion. Vague opacity in the left perihilar region is concerning for nodularity or neoplastic disease. Heart size is stable. Trachea is midline.  IMPRESSION: 1. Right pleural effusion and right pleural disease has slightly progressed since 01/28/2021. Extensive volume loss and compressive atelectasis in the right lung. 2. Evidence for neoplastic disease in the chest demonstrated by pleural nodularity and vague opacity in the left lung. 3. Small left pleural effusion. Electronically Signed   By: Markus Daft M.D.   On: 02/10/2021 10:37   DG Chest 2 View  Result Date: 01/28/2021 CLINICAL DATA:  Dyspnea EXAM: CHEST - 2 VIEW COMPARISON:  01/26/2021 FINDINGS: Moderate right pleural effusion increased compared to prior. Trace left pleural effusion. Airspace disease in the right middle lobe and right lower lobe. Normal heart size. IMPRESSION: Moderate right pleural effusion increased compared to prior. Trace left pleural effusion. Airspace disease in the right middle lobe and right lower lobe. Electronically Signed   By: Donavan Foil M.D.   On: 01/28/2021 17:41   CT BIOPSY  Result Date: 02/08/2021 INDICATION: 55 year old male with a history of renal cell carcinoma and RIGHT-SIDED pleural malignancy referred for biopsy and thoracentesis EXAM: CT BIOPSY OF RIGHT PLEURAL MASS CT THORACENTESIS/DRAINAGE MEDICATIONS: None. ANESTHESIA/SEDATION: Moderate (conscious) sedation was employed during this procedure. A total of Versed 1.5 mg and Fentanyl 75 mcg was administered intravenously. Moderate Sedation Time: 28 minutes. The patient's level of consciousness and vital signs were monitored continuously by radiology nursing throughout the procedure under my direct supervision. FLUOROSCOPY TIME:  CT COMPLICATIONS: NONE PROCEDURE: The procedure, risks, benefits, and alternatives were explained to the patient and the patient's family. Specific risks that were addressed included bleeding, infection, pneumothorax, need for further procedure including chest tube placement, chance of delayed pneumothorax or hemorrhage, hemoptysis, nondiagnostic sample, cardiopulmonary collapse,  death. Questions regarding the procedure were encouraged and answered. The patient understands and consents to the procedure. Patient was positioned in the right decubitus position on the CT gantry table and a scout CT of the chest was performed for planning purposes. Once angle of approach was determined, the skin and subcutaneous tissues this scan was prepped and draped in the usual sterile fashion, and a sterile drape was applied covering the operative field. A sterile gown and sterile gloves were used for the  procedure. Local anesthesia was provided with 1% Lidocaine. The skin and subcutaneous tissues were infiltrated 1% lidocaine for local anesthesia, and a small stab incision was made with an 11 blade scalpel. Using CT guidance, a 17 gauge trocar needle was advanced into the posteroinferior pleural based target. After confirmation of the tip, separate 18 gauge core biopsies were performed. These were placed into solution for transportation to the lab. After removal of the needle, we then performed thoracentesis. 1% lidocaine was used for local anesthesia, anesthetizing from the skin to the pleural surface. With aspiration of fluid the 25 gauge needle was withdrawn. Small stab incision was made. Eight French Safe-T-Centesis kit was then used to introduce plastic catheter into the pleural space. Approximately 2 L of serosanguineous fluid was aspirated. We stopped the fluid drainage once the patient had some mild chest pain for comfort reasons. Catheter was removed. Sterile bandages were placed. Final CT was performed. Patient tolerated the procedure well and remained hemodynamically stable throughout. No complications were encountered and no significant blood loss was encounter IMPRESSION: Status post CT-guided biopsy of right-sided pleural mass as well as therapeutic right-sided thoracentesis. Signed, Dulcy Fanny. Dellia Nims, RPVI [facility] Radiology Electronically  Signed   By: Corrie Mckusick D.O.   On: 02/08/2021 10:47   DG Chest Portable 1 View  Result Date: 01/28/2021 CLINICAL DATA:  Status post thoracentesis EXAM: PORTABLE CHEST 1 VIEW COMPARISON:  Film from earlier in the same day. FINDINGS: Cardiac shadow remains enlarged. Right-sided thoracentesis has been performed with reduction in size of right-sided pleural effusion. A moderate residual effusion is noted. No pneumothorax is seen. There are changes suggestive of pleural base mass laterally on the right. No bony abnormality is seen. IMPRESSION: Reduction in right-sided pleural effusion following thoracentesis. No pneumothorax is noted. Changes suggestive of pleural based mass in the right lung. Electronically Signed   By: Inez Catalina M.D.   On: 01/28/2021 19:10   DG Chest Port 1 View  Result Date: 01/26/2021 CLINICAL DATA:  Shortness of breath. EXAM: PORTABLE CHEST 1 VIEW COMPARISON:  January 25, 2021. FINDINGS: Stable cardiomediastinal silhouette. No pneumothorax is noted. Stable right pleural effusion is noted with associated atelectasis or infiltrate. Bony thorax is unremarkable. IMPRESSION: Stable right pleural effusion with associated atelectasis or infiltrate. Electronically Signed   By: Marijo Conception M.D.   On: 01/26/2021 08:21   DG Chest Port 1 View  Result Date: 01/24/2021 CLINICAL DATA:  Shortness of breath, right pleural effusion EXAM: PORTABLE CHEST 1 VIEW COMPARISON:  01/23/2021 FINDINGS: Moderate to large right pleural effusion. Associated right lung opacity, presumably compressive atelectasis, although superimposed pleural-based metastases are evident on CT. Left lung is clear. The heart is normal in size. IMPRESSION: Moderate to large right pleural effusion. Electronically Signed   By: Julian Hy M.D.   On: 01/24/2021 08:22   DG C-Arm 1-60 Min-No Report  Result Date: 02/15/2021 Fluoroscopy was utilized by the requesting physician.  No radiographic interpretation.    ECHOCARDIOGRAM COMPLETE  Result Date: 01/24/2021    ECHOCARDIOGRAM REPORT   Patient Name:   [name] Date of Exam: 01/24/2021 Medical Rec #:  [id number]       Height:       72.0 in Accession #:    [id number]      Weight:       183.9 lb Date of Birth:  1966-06-29       BSA:  2.056 m Patient Age:    55 years        BP:           116/57 mmHg Patient Gender: M               HR:           105 bpm. Exam Location:  Inpatient Procedure: 2D Echo, Cardiac Doppler and Color Doppler Indications:    Pericardial effusion I31.3  History:        Patient has no prior history of Echocardiogram examinations.                 Risk Factors:Hypertension.  Sonographer:    Bernadene Person RDCS Referring Phys: [id number] [place]  1. 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 There is no outflow tract obstruction. Left ventricular ejection fraction, by estimation, is >75%. The left ventricle has hyperdynamic function. The left ventricle has no regional wall motion abnormalities. There is moderate asymmetric left ventricular hypertrophy of the apical segment. Left ventricular diastolic parameters are consistent with Grade I diastolic dysfunction (impaired relaxation). The average left ventricular global longitudinal strain is -20.1 %. The global longitudinal strain is normal.  2. Right ventricular systolic function is hyperdynamic. The right ventricular size is normal. There is moderately elevated pulmonary artery systolic pressure.  3. A small pericardial effusion is present. The pericardial effusion is circumferential. There is no evidence of cardiac tamponade.  4. The mitral valve is normal in structure. No evidence of mitral valve regurgitation.  5. Tricuspid valve regurgitation is moderate.  6. The aortic valve is tricuspid. Aortic valve regurgitation is not visualized. No aortic stenosis is present.  7. Aortic dilatation noted. There is mild dilatation of the  aortic root, measuring 41 mm. There is mild dilatation of the ascending aorta, measuring 40 mm.  8. No thrombus/mass is seen in the inferior vena cava. The inferior vena cava is normal in size with greater than 50% respiratory variability, suggesting right atrial pressure of 3 mmHg. 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 There is no outflow tract obstruction. Left ventricular ejection fraction, by estimation, is >75%. The left ventricle has hyperdynamic function. The left ventricle has no regional wall motion abnormalities. The average left ventricular global longitudinal strain is -20.1 %. The global longitudinal strain is normal. The left ventricular internal cavity size was normal in size. There is moderate asymmetric left ventricular hypertrophy of the apical segment. Left ventricular diastolic parameters are consistent with Grade I diastolic dysfunction (impaired relaxation). Normal left ventricular filling pressure. Right Ventricle: The right ventricular size is normal. No increase in right ventricular wall thickness. Right ventricular systolic function is hyperdynamic. There is moderately elevated pulmonary artery systolic pressure. The tricuspid regurgitant velocity is 3.63 m/s, and with an assumed right atrial pressure of 3 mmHg, the estimated right ventricular systolic pressure is 53.6 mmHg. Left Atrium: Left atrial size was normal in size. Right Atrium: Right atrial size was normal in size. Pericardium: A small pericardial effusion is present. The pericardial effusion is circumferential. There is no evidence of cardiac tamponade. Mitral Valve: The mitral valve is normal in structure. No evidence of mitral valve regurgitation. Tricuspid Valve: The tricuspid valve is normal in structure. Tricuspid valve regurgitation is moderate. Aortic Valve: The aortic valve is tricuspid. Aortic valve regurgitation is not visualized. No aortic stenosis  is present. Pulmonic Valve: The pulmonic valve was normal in structure. Pulmonic valve regurgitation is not visualized.  Aorta: Aortic dilatation noted. There is mild dilatation of the aortic root, measuring 41 mm. There is mild dilatation of the ascending aorta, measuring 40 mm. Venous: No thrombus/mass is seen in the inferior vena cava. The inferior vena cava is normal in size with greater than 50% respiratory variability, suggesting right atrial pressure of 3 mmHg. IAS/Shunts: There is redundancy of the interatrial septum. No atrial level shunt detected by color flow Doppler.  LEFT VENTRICLE PLAX 2D LVIDd:         4.20 cm  Diastology LVIDs:         2.30 cm  LV e' medial:    5.55 cm/s LV PW:         1.50 cm  LV E/e' medial:  9.6 LV IVS:        1.50 cm  LV e' lateral:   7.83 cm/s LVOT diam:     2.50 cm  LV E/e' lateral: 6.8 LV SV:         133 LV SV Index:   64       2D Longitudinal Strain LVOT Area:     4.91 cm 2D Strain GLS Avg:     -20.1 %  RIGHT VENTRICLE RV S prime:     17.90 cm/s TAPSE (M-mode): 2.6 cm LEFT ATRIUM             Index       RIGHT ATRIUM           Index LA diam:        2.60 cm 1.26 cm/m  RA Area:     14.60 cm LA Vol (A2C):   47.1 ml 22.91 ml/m RA Volume:   36.80 ml  17.90 ml/m LA Vol (A4C):   29.0 ml 14.11 ml/m LA Biplane Vol: 38.6 ml 18.78 ml/m  AORTIC VALVE LVOT Vmax:   180.00 cm/s LVOT Vmean:  117.000 cm/s LVOT VTI:    0.270 m  AORTA Ao Root diam: 4.10 cm Ao Asc diam:  4.00 cm MITRAL VALVE               TRICUSPID VALVE MV Area (PHT): 2.91 cm    TR Peak grad:   52.7 mmHg MV Decel Time: 261 msec    TR Vmax:        363.00 cm/s MV E velocity: 53.50 cm/s MV A velocity: 76.10 cm/s  SHUNTS MV E/A ratio:  0.70        Systemic VTI:  0.27 m                            Systemic Diam: 2.50 cm Dani Gobble Croitoru MD Electronically signed by Sanda Klein MD Signature Date/Time: 01/24/2021/12:07:29 PM    Final    CT IMAGE GUIDED DRAINAGE BY PERCUTANEOUS CATHETER  Result Date: 02/08/2021 INDICATION:  55 year old male with a history of renal cell carcinoma and RIGHT-SIDED pleural malignancy referred for biopsy and thoracentesis EXAM: CT BIOPSY OF RIGHT PLEURAL MASS CT THORACENTESIS/DRAINAGE MEDICATIONS: None. ANESTHESIA/SEDATION: Moderate (conscious) sedation was employed during this procedure. A total of Versed 1.5 mg and Fentanyl 75 mcg was administered intravenously. Moderate Sedation Time: 28 minutes. The patient's level of consciousness and vital signs were monitored continuously by radiology nursing throughout the procedure under my direct supervision. FLUOROSCOPY TIME:  CT COMPLICATIONS: NONE PROCEDURE: The procedure, risks, benefits, and alternatives were explained to the patient and the patient's family. Specific risks that were addressed included bleeding, infection, pneumothorax, need  for further procedure including chest tube placement, chance of delayed pneumothorax or hemorrhage, hemoptysis, nondiagnostic sample, cardiopulmonary collapse, death. Questions regarding the procedure were encouraged and answered. The patient understands and consents to the procedure. Patient was positioned in the right decubitus position on the CT gantry table and a scout CT of the chest was performed for planning purposes. Once angle of approach was determined, the skin and subcutaneous tissues this scan was prepped and draped in the usual sterile fashion, and a sterile drape was applied covering the operative field. A sterile gown and sterile gloves were used for the procedure. Local anesthesia was provided with 1% Lidocaine. The skin and subcutaneous tissues were infiltrated 1% lidocaine for local anesthesia, and a small stab incision was made with an 11 blade scalpel. Using CT guidance, a 17 gauge trocar needle was advanced into the posteroinferior pleural based target. After confirmation of the tip, separate 18 gauge core biopsies were performed. These were placed into solution for transportation to the lab. After  removal of the needle, we then performed thoracentesis. 1% lidocaine was used for local anesthesia, anesthetizing from the skin to the pleural surface. With aspiration of fluid the 25 gauge needle was withdrawn. Small stab incision was made. Eight French Safe-T-Centesis kit was then used to introduce plastic catheter into the pleural space. Approximately 2 L of serosanguineous fluid was aspirated. We stopped the fluid drainage once the patient had some mild chest pain for comfort reasons. Catheter was removed. Sterile bandages were placed. Final CT was performed. Patient tolerated the procedure well and remained hemodynamically stable throughout. No complications were encountered and no significant blood loss was encounter IMPRESSION: Status post CT-guided biopsy of right-sided pleural mass as well as therapeutic right-sided thoracentesis. Signed, Dulcy Fanny. Dellia Nims, RPVI [facility] Radiology Electronically Signed   By: Corrie Mckusick D.O.   On: 02/08/2021 10:47   IR THORACENTESIS ASP PLEURAL SPACE W/IMG GUIDE  Result Date: 01/25/2021 INDICATION: Patient with history of colon cancer, dyspnea, and recurrent right pleural effusion. Request is made for therapeutic right thoracentesis. EXAM: ULTRASOUND GUIDED THERAPEUTIC RIGHT THORACENTESIS MEDICATIONS: 10 mL 1% lidocaine COMPLICATIONS: None immediate. PROCEDURE: An ultrasound guided thoracentesis was thoroughly discussed with the patient and questions answered. The benefits, risks, alternatives and complications were also discussed. The patient understands and wishes to proceed with the procedure. Written consent was obtained. Ultrasound was performed to localize and mark an adequate pocket of fluid in the right chest. The area was then prepped and draped in the normal sterile fashion. 1% Lidocaine was used for local anesthesia. Under ultrasound guidance a 6 Fr Safe-T-Centesis catheter was introduced. Thoracentesis  was performed. The catheter was removed and a dressing applied. FINDINGS: A total of approximately 2 L of dark red fluid was removed. Procedure was stopped after 2 L per patient request secondary to patient's symptoms (chest pain, coughing). IMPRESSION: Successful ultrasound guided right thoracentesis yielding 2 L of pleural fluid. Read by: Earley Abide, PA-C Electronically Signed   By: Miachel Roux M.D.   On: 01/25/2021 11:21   I reviewed cytology reports, negative. I have reviewed pathology reports with Dr Melina Copa.  I spent 30 minutes in the care of this patient including H and P, review of records, counseling and coordination of care. We discussed about need for completion of imaging including MR brain for staging, pathology report supporting papillary sub type and adverse effects of cabozantinib.    Benay Pike, MD 02/22/2021 9:33 PM

## 2021-02-23 ENCOUNTER — Encounter: Payer: Self-pay | Admitting: Hematology and Oncology

## 2021-02-23 ENCOUNTER — Telehealth: Payer: Self-pay

## 2021-02-23 ENCOUNTER — Other Ambulatory Visit (HOSPITAL_COMMUNITY): Payer: Self-pay

## 2021-02-23 ENCOUNTER — Telehealth: Payer: Self-pay | Admitting: Pharmacist

## 2021-02-23 DIAGNOSIS — C642 Malignant neoplasm of left kidney, except renal pelvis: Secondary | ICD-10-CM

## 2021-02-23 MED ORDER — CABOZANTINIB S-MALATE 40 MG PO TABS
40.0000 mg | ORAL_TABLET | Freq: Every day | ORAL | 1 refills | Status: DC
  Filled 2021-02-23: qty 30, 30d supply, fill #0

## 2021-02-23 MED ORDER — CABOZANTINIB S-MALATE 40 MG PO TABS: 40.0000 mg | ORAL_TABLET | Freq: Every day | ORAL | 1 refills | Status: AC

## 2021-02-23 NOTE — Telephone Encounter (Signed)
Oral Oncology Pharmacist Encounter  Received new prescription for Cabometyx (cabozantinib) for the treatment of metastatic renal cell carcinoma, planned duration until disease progression or unacceptable drug toxicity.  Prescription dose and frequency assessed for appropriateness. CBC w/ Diff and CMP from 02/22/21 assessed, noted LFTs slightly uptrended - recommend close monitoring while on therapy in case need for dose adjustment - current dose OK. Scr 1.52 mg/dL (CrCl ~62 mL/min), no renal dose adjustments required. BP 138/70 mmHg on 02/22/21 -   Current medication list in Epic reviewed, no relevant/significant DDIs with Cabometyx identified.  Evaluated chart and no patient barriers to medication adherence noted.   Patient agreement for treatment documented in MD note on 02/22/21.  Patient's insurance requires that Cabometyx be dispensed through [facility]. Will redirect for dispensing.   Oral Oncology Clinic will continue to follow for insurance authorization, copayment issues, initial counseling and start date.  Leron Croak, PharmD, BCPS Hematology/Oncology Clinical Pharmacist [facility] [phone] 02/23/2021 8:36 AM

## 2021-02-23 NOTE — Telephone Encounter (Signed)
Called and spoke w/ patient. Informed him of upcoming brain MRI scheduled Saturday 4/30 at 3:30pm. Patient verbalized understanding.

## 2021-02-23 NOTE — Telephone Encounter (Signed)
Oral Oncology Patient Advocate Encounter  Prior Authorization for Cabometyx has been approved.    PA# [id number] Effective dates: 02/23/21  through 02/23/22  Patient must fill with [facility]  Oral Oncology Clinic will continue to follow.   [place] Patient [place] Phone [phone] Fax [phone] 02/23/2021 1:00 PM

## 2021-02-23 NOTE — Progress Notes (Signed)
START OFF PATHWAY REGIMEN - Renal Cell   [id number]:Cabozantinib (Cabometyx) 60 mg PO Daily D1-28 q28 Days:   A cycle is every 28 days:     Cabozantinib (tablet)   **Always confirm dose/schedule in your pharmacy ordering system**  Patient Characteristics: Stage IV/Metastatic Disease, Non Clear Cell, First Line Therapeutic Status: Stage IV/Metastatic Disease Histology: Non Clear Cell Line of Therapy: First Line Intent of Therapy: Non-Curative / Palliative Intent, Discussed with Patient

## 2021-02-23 NOTE — Telephone Encounter (Signed)
Oral Oncology Patient Advocate Encounter  Received notification from [place] that prior authorization for Cabometyx is required.  PA submitted on CoverMyMeds Key [id number] Status is pending  Oral Oncology Clinic will continue to follow.  [place] Patient [place] Phone [phone] Fax [phone] 02/23/2021 8:47 AM

## 2021-02-25 ENCOUNTER — Telehealth: Payer: Self-pay | Admitting: Hematology and Oncology

## 2021-02-25 NOTE — Telephone Encounter (Signed)
Scheduled appt per 4/28 inbasket msg. Pt aware.

## 2021-02-27 ENCOUNTER — Other Ambulatory Visit: Payer: Self-pay

## 2021-02-27 ENCOUNTER — Ambulatory Visit (HOSPITAL_COMMUNITY)
Admission: RE | Admit: 2021-02-27 | Discharge: 2021-02-27 | Disposition: A | Discharge status: Home / Self Care | Payer: BC Managed Care – PPO | Source: Ambulatory Visit | Attending: Hematology and Oncology | Admitting: Hematology and Oncology

## 2021-02-27 DIAGNOSIS — C799 Secondary malignant neoplasm of unspecified site: Secondary | ICD-10-CM | POA: Diagnosis present

## 2021-02-27 MED ORDER — GADOBUTROL 1 MMOL/ML IV SOLN
8.0000 mL | Freq: Once | INTRAVENOUS | Status: AC | PRN
  Administered 2021-02-27: 8 mL via INTRAVENOUS

## 2021-03-01 ENCOUNTER — Telehealth: Payer: Self-pay | Admitting: *Deleted

## 2021-03-01 ENCOUNTER — Telehealth: Payer: Self-pay

## 2021-03-01 NOTE — Telephone Encounter (Signed)
Patient contacted the office stating he was having trouble sleeping, his left foot/ankle swelling, cough and short of breath when walking.  He is s/p PleurX placement 02/15/21 with Dr. Roxan Hockey.  He is currently draining his PleurX every third day and getting out 25 ml's.   Advised that he would need to contact his PCP, Dr. Woody Seller about help with sleep as he says that he is coughing at night, non-productive, and this has been the case for a long time. It is not a new symptom.  He stated that he is some short of breath when he is active, which is to be expected especially depending on his PleurX drainage schedule.  Advised that he should be seen today by PCP about his left ankle/ foot or go to the ED for evaluation.  He stated that it does not hurt, is not red, and is not hot to touch.  The other leg does not look the same. He acknowledged receipt.

## 2021-03-01 NOTE — Telephone Encounter (Signed)
Spoke directly with the patient and instructed him to go to the Emergency Room per Dr Iruku's message to rule out a DVT.  Patient expressed understanding.

## 2021-03-01 NOTE — Telephone Encounter (Signed)
Received call from pt. He states his left foot, ankle and calf is swollen. This started last night. Denies pain.  Please advise

## 2021-03-02 ENCOUNTER — Other Ambulatory Visit: Payer: Self-pay | Admitting: *Deleted

## 2021-03-02 ENCOUNTER — Inpatient Hospital Stay: Payer: BC Managed Care – PPO | Attending: Hematology and Oncology | Admitting: Hematology and Oncology

## 2021-03-02 ENCOUNTER — Telehealth: Payer: Self-pay | Admitting: Hematology and Oncology

## 2021-03-02 ENCOUNTER — Encounter: Payer: Self-pay | Admitting: Hematology and Oncology

## 2021-03-02 ENCOUNTER — Inpatient Hospital Stay: Payer: BC Managed Care – PPO

## 2021-03-02 DIAGNOSIS — I10 Essential (primary) hypertension: Secondary | ICD-10-CM | POA: Insufficient documentation

## 2021-03-02 DIAGNOSIS — C778 Secondary and unspecified malignant neoplasm of lymph nodes of multiple regions: Secondary | ICD-10-CM | POA: Insufficient documentation

## 2021-03-02 DIAGNOSIS — C782 Secondary malignant neoplasm of pleura: Secondary | ICD-10-CM | POA: Insufficient documentation

## 2021-03-02 DIAGNOSIS — I313 Pericardial effusion (noninflammatory): Secondary | ICD-10-CM | POA: Insufficient documentation

## 2021-03-02 DIAGNOSIS — C7802 Secondary malignant neoplasm of left lung: Secondary | ICD-10-CM | POA: Insufficient documentation

## 2021-03-02 DIAGNOSIS — J90 Pleural effusion, not elsewhere classified: Secondary | ICD-10-CM | POA: Insufficient documentation

## 2021-03-02 DIAGNOSIS — C649 Malignant neoplasm of unspecified kidney, except renal pelvis: Secondary | ICD-10-CM | POA: Diagnosis not present

## 2021-03-02 DIAGNOSIS — C7801 Secondary malignant neoplasm of right lung: Secondary | ICD-10-CM | POA: Insufficient documentation

## 2021-03-02 DIAGNOSIS — D649 Anemia, unspecified: Secondary | ICD-10-CM | POA: Insufficient documentation

## 2021-03-02 DIAGNOSIS — Z87891 Personal history of nicotine dependence: Secondary | ICD-10-CM | POA: Insufficient documentation

## 2021-03-02 DIAGNOSIS — C787 Secondary malignant neoplasm of liver and intrahepatic bile duct: Secondary | ICD-10-CM | POA: Insufficient documentation

## 2021-03-02 DIAGNOSIS — Z809 Family history of malignant neoplasm, unspecified: Secondary | ICD-10-CM | POA: Insufficient documentation

## 2021-03-02 DIAGNOSIS — R634 Abnormal weight loss: Secondary | ICD-10-CM | POA: Insufficient documentation

## 2021-03-02 DIAGNOSIS — E785 Hyperlipidemia, unspecified: Secondary | ICD-10-CM | POA: Insufficient documentation

## 2021-03-02 DIAGNOSIS — C642 Malignant neoplasm of left kidney, except renal pelvis: Secondary | ICD-10-CM | POA: Insufficient documentation

## 2021-03-02 MED ORDER — DOXYCYCLINE HYCLATE 100 MG PO TABS: 100.0000 mg | ORAL_TABLET | Freq: Two times a day (BID) | ORAL | 0 refills | Status: AC

## 2021-03-02 NOTE — Progress Notes (Signed)
[place] NOTE  Patient Care Team: Glenda Chroman, MD as PCP - General (Internal Medicine) [name], MD as PCP - Cardiology (Cardiology)  CHIEF COMPLAINTS/PURPOSE OF CONSULTATION:  Phone visit to discuss about cabmetyx.  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 Given papillary sub type, we have discussed front line cabozantinib vs caboxantinib plus nivolumab.  Given his PS,we agreed to start with single agent cabozatinib. I have again discussed adverse effects today with [name] on the phone. We discussed about common side effects such as fatigue, skin rash, HTN, electrolyte abnormalities, diarrhea, stomatitis, hair discoloration and occasionally alopecia. He will talk to our oral pharmacist again about the co pay, informed [name], she will call the patient. I advised he try it as soon as he recieves it. If he chooses not to proceed with any treatment, I will recommend palliative care.  Symptomatic anemia Symptomatic anemia, getting blood transfusion today Likely from [place]. Will follow  No orders of the defined types were placed in this encounter.    HISTORY OF PRESENTING ILLNESS:   [name] 55 y.o. male is here because of concern for metastatic renal cell carcinoma.  Oncology History Overview Note  [name] is a 55 year old male with a past medical history significant for colon cancer (status post hemicolectomy in 2015 -previously followed by Dr. Jacquiline Doe at [place]), hyperlipidemia, hypertension.  The patient was transferred from [facility] emergency department to [facility] due to acute hypoxic respiratory failure.  The patient has a 3-week history of cough which  progressively worsened.  He subsequently developed shortness of breath. He has a recent 15-20 pound unintentional weight loss in the past few weeks and decreased oral intake due to lack of appetite and generalized weakness.  CT chest with contrast was performed on 01/22/2021 at [facility] and reviewed through care everywhere.  This showed a large solid heterogeneous mass arising off the upper pole of the left kidney consistent with primary renal cell carcinoma, tumor extension into the left renal vein identified, left retroperitoneal metastatic adenopathy, extensive nodal metastasis within the mediastinum and bilateral hilar regions, bilateral pleural metastasis with large malignant right pleural effusion, multiple pulmonary nodules identified compatible with pulmonary metastasis, small to moderate pericardial effusion.  The patient had a CT abdomen/pelvis performed 01/23/2021 which showed a 15.1 cm left upper pole renal mass compatible with advanced renal cell carcinoma, expansile tumor thrombus the left renal vein, so see the left para-aortic nodal metastases, small hepatic metastases, bilateral pulmonary metastases, extensive pleural metastases in the right hemithorax with moderate to large pleural effusion, thoracic nodal metastases.  The patient had ultrasound-guided thoracentesis performed 01/23/2021 with 2.5 L of fluid removed and a repeat thoracentesis performed earlier today with 2 L of fluid removed. Fluid from thoracentesis negative for malignant cells.  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    Metastatic renal cell carcinoma ([place])  01/23/2021 Initial Diagnosis   Metastatic renal cell carcinoma (HCC)    Interval History  He is here for a phone visit. During his last visit, we discussed about cabometyx but he apparently wanted to talk to me one more time  before committing to it. He is at [facility] with unilateral leg swelling of the LLE, according to his verbal report, no DVT He is however getting blood transfusion. He continues to feel tired, otherwise was worried about side effects of the drug Ongoing SOB, now has pleurex. No pain issues that he mentioned today.  MEDICAL HISTORY:  Past Medical History:  Diagnosis Date  . Anemia   . Cancer [facility])    colon cancer  . Colon cancer ([place])   . Dyspnea   . GIB (gastrointestinal bleeding)   . High cholesterol   . History of colon cancer 01/23/2021  . HTN (hypertension)     SURGICAL HISTORY: Past Surgical History:  Procedure Laterality Date  . CHEST TUBE INSERTION Right 02/15/2021   Procedure: INSERTION PLEURAL DRAINAGE CATHETER;  Surgeon: Melrose Nakayama, MD;  Location: [place];  Service: Thoracic;  Laterality: Right;  . COLON RESECTION    . HEMICOLECTOMY Right   . HERNIA REPAIR    . IR THORACENTESIS ASP PLEURAL SPACE W/IMG GUIDE  01/25/2021    SOCIAL HISTORY: Social History   Socioeconomic History  . Marital status: Single    Spouse name: Not on file  . Number of children: Not on file  . Years of education: Not on file  . Highest education level: Not on file  Occupational History  . Not on file  Tobacco Use  . Smoking status: Former Smoker    Quit date: 2006    Years since quitting: 16.3  . Smokeless tobacco: Never Used  Vaping Use  . Vaping Use: Never used  Substance and Sexual Activity  . Alcohol use: No  . Drug use: No  . Sexual activity: Not Currently  Other Topics Concern  . Not on file  Social History Narrative  . Not on file   Social Determinants of Health   Financial Resource Strain: Not on file  Food Insecurity: Not on file  Transportation Needs: Not on file  Physical Activity: Not on file  Stress: Not on file  Social Connections: Not on file  Intimate Partner Violence: Not on file    FAMILY HISTORY: Family History  Problem  Relation Age of Onset  . Hypertension Mother   . Hypertension Sister   . Hypertension Brother   . Heart attack Cousin   . Cancer Father     ALLERGIES:  has No Known Allergies.  MEDICATIONS:  Current Outpatient Medications  Medication Sig Dispense Refill  . albuterol (VENTOLIN HFA) 108 (90 Base) MCG/ACT inhaler Inhale 2 puffs into the lungs every 4 (four) hours as needed for shortness of breath or wheezing.    Marland Kitchen amLODipine (NORVASC) 5 MG tablet Take 5 mg by mouth daily.    . cabozantinib (CABOMETYX) 40 MG tablet Take 1 tablet (40 mg total) by mouth daily. Take on an empty stomach, 1 hour before or 2 hours after meals. 30 tablet 1  . meclizine (ANTIVERT) 12.5 MG tablet TAKE 1 TABLET (12.5 MG TOTAL) BY MOUTH 3 (THREE) TIMES DAILY AS NEEDED FOR DIZZINESS. 90 tablet 3  . meloxicam (MOBIC) 7.5 MG tablet Take 7.5 mg by mouth 2 (two) times daily as needed for pain.    Marland Kitchen ondansetron (ZOFRAN) 8 MG tablet Take 1 tablet (8 mg total) by mouth every 8 (eight) hours as needed for nausea. 30 tablet 3  . oxyCODONE (OXY IR/ROXICODONE) 5 MG immediate release tablet Take 1 tablet (5 mg total) by mouth every 6 (six) hours as needed (postop  pain). 28 tablet 0  . pantoprazole (PROTONIX) 40 MG tablet Take 40 mg by mouth daily.    . sennosides-docusate sodium (SENOKOT-S) 8.6-50 MG tablet Take 1 tablet by mouth daily.     No current facility-administered medications for this visit.   PHYSICAL EXAMINATION:  ECOG PERFORMANCE STATUS: 0 - Asymptomatic  VS and PE not done , telephone visit.  LABORATORY DATA:  I have reviewed the data as listed Lab Results  Component Value Date   WBC 8.2 02/22/2021   HGB 7.9 (L) 02/22/2021   HCT 25.6 (L) 02/22/2021   MCV 80.5 02/22/2021   PLT 591 (H) 02/22/2021     Chemistry      Component Value Date/Time   NA 139 02/22/2021 1202   K 4.9 02/22/2021 1202   CL 105 02/22/2021 1202   CO2 23 02/22/2021 1202   BUN 15 02/22/2021 1202   CREATININE 1.52 (H) 02/22/2021 1202       Component Value Date/Time   CALCIUM 9.0 02/22/2021 1202   ALKPHOS 79 02/22/2021 1202   AST 43 (H) 02/22/2021 1202   ALT 45 (H) 02/22/2021 1202   BILITOT 0.4 02/22/2021 1202       RADIOGRAPHIC STUDIES: I have personally reviewed the radiological images as listed and agreed with the findings in the report. DG Chest 2 View  Result Date: 02/12/2021 CLINICAL DATA:  55 year old male with a history of metastatic disease and malignant right-sided pleural effusion EXAM: CHEST - 2 VIEW COMPARISON:  02/10/2021, 01/28/2021 FINDINGS: Cardiomediastinal silhouette unchanged in size and contour with the right heart border obscured by overlying lung/pleural disease. Increasing peripheral opacities of the right lung, particularly at the apex. Blunting at the left costophrenic angle with small meniscus on the lateral view in the sulcus. No displaced fracture. IMPRESSION: Increased peripheral opacities of the right lung, compatible with increased pleural fluid and associated atelectasis/consolidation, with associated metastatic disease better demonstrated on prior CT imaging. Trace left-sided pleural effusion. Electronically Signed   By: Corrie Mckusick D.O.   On: 02/12/2021 16:20   DG Chest 2 View  Result Date: 02/10/2021 CLINICAL DATA:  Right pleural effusion. Metastatic renal cell carcinoma. EXAM: CHEST - 2 VIEW COMPARISON:  01/18/2021 FINDINGS: Pleural-based densities in the right chest are compatible with pleural fluid and nodular pleural disease. Right pleural effusion has slightly increased from the previous examination. Vague opacities in the periphery of the right lung could be related to pleural fluid and/or compressive atelectasis. Evidence for small left pleural effusion. Vague opacity in the left perihilar region is concerning for nodularity or neoplastic disease. Heart size is stable. Trachea is midline. IMPRESSION: 1. Right pleural effusion and right pleural disease has slightly progressed since  01/28/2021. Extensive volume loss and compressive atelectasis in the right lung. 2. Evidence for neoplastic disease in the chest demonstrated by pleural nodularity and vague opacity in the left lung. 3. Small left pleural effusion. Electronically Signed   By: Markus Daft M.D.   On: 02/10/2021 10:37   Thomas BRAIN W WO CONTRAST  Result Date: 02/28/2021 CLINICAL DATA:  Staging for renal cell carcinoma. EXAM: MRI HEAD WITHOUT AND WITH CONTRAST TECHNIQUE: Multiplanar, multiecho pulse sequences of the brain and surrounding structures were obtained without and with intravenous contrast. CONTRAST:  13m GADAVIST GADOBUTROL 1 MMOL/ML IV SOLN COMPARISON:  None. FINDINGS: Brain: Diffusion imaging does not show any acute or subacute infarction or other cause of restricted diffusion. No focal abnormality affects the brainstem or cerebellum. Cerebral hemispheres are normal. No stroke,  mass, hemorrhage, hydrocephalus or extra-axial collection. After contrast administration, no abnormal enhancement occurs. Vascular: Major vessels at the base of the brain show flow. Skull and upper cervical spine: Negative Sinuses/Orbits: Opacification of the right division of the sphenoid sinus. Other sinuses clear. Orbits negative. Other: None IMPRESSION: No evidence of metastatic disease.  Normal appearance of the brain. Fluid opacification of the right division of the sphenoid sinus. Electronically Signed   By: Nelson Chimes M.D.   On: 02/28/2021 20:38   CT BIOPSY  Result Date: 02/08/2021 INDICATION: 55 year old male with a history of renal cell carcinoma and RIGHT-SIDED pleural malignancy referred for biopsy and thoracentesis EXAM: CT BIOPSY OF RIGHT PLEURAL MASS CT THORACENTESIS/DRAINAGE MEDICATIONS: None. ANESTHESIA/SEDATION: Moderate (conscious) sedation was employed during this procedure. A total of Versed 1.5 mg and Fentanyl 75 mcg was administered intravenously. Moderate Sedation Time: 28 minutes. The patient's level of consciousness and  vital signs were monitored continuously by radiology nursing throughout the procedure under my direct supervision. FLUOROSCOPY TIME:  CT COMPLICATIONS: NONE PROCEDURE: The procedure, risks, benefits, and alternatives were explained to the patient and the patient's family. Specific risks that were addressed included bleeding, infection, pneumothorax, need for further procedure including chest tube placement, chance of delayed pneumothorax or hemorrhage, hemoptysis, nondiagnostic sample, cardiopulmonary collapse, death. Questions regarding the procedure were encouraged and answered. The patient understands and consents to the procedure. Patient was positioned in the right decubitus position on the CT gantry table and a scout CT of the chest was performed for planning purposes. Once angle of approach was determined, the skin and subcutaneous tissues this scan was prepped and draped in the usual sterile fashion, and a sterile drape was applied covering the operative field. A sterile gown and sterile gloves were used for the procedure. Local anesthesia was provided with 1% Lidocaine. The skin and subcutaneous tissues were infiltrated 1% lidocaine for local anesthesia, and a small stab incision was made with an 11 blade scalpel. Using CT guidance, a 17 gauge trocar needle was advanced into the posteroinferior pleural based target. After confirmation of the tip, separate 18 gauge core biopsies were performed. These were placed into solution for transportation to the lab. After removal of the needle, we then performed thoracentesis. 1% lidocaine was used for local anesthesia, anesthetizing from the skin to the pleural surface. With aspiration of fluid the 25 gauge needle was withdrawn. Small stab incision was made. Eight French Safe-T-Centesis kit was then used to introduce plastic catheter into the pleural space. Approximately 2 L of serosanguineous fluid was aspirated. We stopped the fluid drainage once the patient had  some mild chest pain for comfort reasons. Catheter was removed. Sterile bandages were placed. Final CT was performed. Patient tolerated the procedure well and remained hemodynamically stable throughout. No complications were encountered and no significant blood loss was encounter IMPRESSION: Status post CT-guided biopsy of right-sided pleural mass as well as therapeutic right-sided thoracentesis. Signed, Dulcy Fanny. Dellia Nims, RPVI [facility] Radiology Electronically Signed   By: Corrie Mckusick D.O.   On: 02/08/2021 10:47   DG C-Arm 1-60 Min-No Report  Result Date: 02/15/2021 Fluoroscopy was utilized by the requesting physician.  No radiographic interpretation.   CT IMAGE GUIDED DRAINAGE BY PERCUTANEOUS CATHETER  Result Date: 02/08/2021 INDICATION: 55 year old male with a history of renal cell carcinoma and RIGHT-SIDED pleural malignancy referred for biopsy and thoracentesis EXAM: CT BIOPSY OF RIGHT PLEURAL MASS CT THORACENTESIS/DRAINAGE MEDICATIONS: None. ANESTHESIA/SEDATION: Moderate (conscious) sedation was employed during this  procedure. A total of Versed 1.5 mg and Fentanyl 75 mcg was administered intravenously. Moderate Sedation Time: 28 minutes. The patient's level of consciousness and vital signs were monitored continuously by radiology nursing throughout the procedure under my direct supervision. FLUOROSCOPY TIME:  CT COMPLICATIONS: NONE PROCEDURE: The procedure, risks, benefits, and alternatives were explained to the patient and the patient's family. Specific risks that were addressed included bleeding, infection, pneumothorax, need for further procedure including chest tube placement, chance of delayed pneumothorax or hemorrhage, hemoptysis, nondiagnostic sample, cardiopulmonary collapse, death. Questions regarding the procedure were encouraged and answered. The patient understands and consents to the procedure. Patient was positioned in the right  decubitus position on the CT gantry table and a scout CT of the chest was performed for planning purposes. Once angle of approach was determined, the skin and subcutaneous tissues this scan was prepped and draped in the usual sterile fashion, and a sterile drape was applied covering the operative field. A sterile gown and sterile gloves were used for the procedure. Local anesthesia was provided with 1% Lidocaine. The skin and subcutaneous tissues were infiltrated 1% lidocaine for local anesthesia, and a small stab incision was made with an 11 blade scalpel. Using CT guidance, a 17 gauge trocar needle was advanced into the posteroinferior pleural based target. After confirmation of the tip, separate 18 gauge core biopsies were performed. These were placed into solution for transportation to the lab. After removal of the needle, we then performed thoracentesis. 1% lidocaine was used for local anesthesia, anesthetizing from the skin to the pleural surface. With aspiration of fluid the 25 gauge needle was withdrawn. Small stab incision was made. Eight French Safe-T-Centesis kit was then used to introduce plastic catheter into the pleural space. Approximately 2 L of serosanguineous fluid was aspirated. We stopped the fluid drainage once the patient had some mild chest pain for comfort reasons. Catheter was removed. Sterile bandages were placed. Final CT was performed. Patient tolerated the procedure well and remained hemodynamically stable throughout. No complications were encountered and no significant blood loss was encounter IMPRESSION: Status post CT-guided biopsy of right-sided pleural mass as well as therapeutic right-sided thoracentesis. Signed, Dulcy Fanny. Dellia Nims, RPVI [facility] Radiology Electronically Signed   By: Corrie Mckusick D.O.   On: 02/08/2021 10:47   I reviewed cytology reports, negative. I have reviewed pathology reports with Dr Melina Copa.  I  connected with  [name] on 03/02/21 by telephone application and verified that I am speaking with the correct person using two identifiers.   I discussed the limitations of evaluation and management by telemedicine. The patient expressed understanding and agreed to proceed.  I spent 20 minutes in the care of this patient including,history, review of adverse effects from cabo, coordination with our pharmacist and scheduling.    Benay Pike, MD 03/02/2021 11:40 AM

## 2021-03-02 NOTE — Progress Notes (Signed)
Claiborne Billings, patient's home health nurse, contacted the office after visiting Mr. Ehle to drain his Pleurx catheter placed on 4/18 by Dr. Roxan Hockey. Nurse states site is red and warm to touch. Patient denies pain at this site. Per nurse, dried serosanguinous drainage noted on dressing sponge. Patient with a temperature of 99.1 during visit.  Nurse does state that patient received a blood transfusion earlier today. Photo was taken of insertion site and sent to office cell phone. Photo shows redness extending from insertion site. Photo shown to Lars Pinks, [place] and Dr. Kipp Brood. Order placed for Doxycycline 100mg  PO BID for 5 days. Patient made an appointment to be seen in clinic tomorrow by a PA. Per patient he has been feeling weak of lately and will attempt to arrange a ride to the appointment. Patient strongly advised to find a ride. Appointment scheduled for 2pm May 4th. Patient aware of medication sent to preferred [facility] [address] In [place].

## 2021-03-02 NOTE — Assessment & Plan Note (Signed)
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 Given papillary sub type, we have discussed front line cabozantinib vs caboxantinib plus nivolumab.  Given his PS,we agreed to start with single agent cabozatinib. I have again discussed adverse effects today with [name] on the phone. We discussed about common side effects such as fatigue, skin rash, HTN, electrolyte abnormalities, diarrhea, stomatitis, hair discoloration and occasionally alopecia. He will talk to our oral pharmacist again about the co pay, informed [name], she will call the patient. I advised he try it as soon as he recieves it. If he chooses not to proceed with any treatment, I will recommend palliative care.

## 2021-03-02 NOTE — Assessment & Plan Note (Signed)
Symptomatic anemia, getting blood transfusion today Likely from [place]. Will follow

## 2021-03-02 NOTE — Telephone Encounter (Signed)
Scheduled appts per 5/3 sch msg. Pt aware.  

## 2021-03-03 ENCOUNTER — Ambulatory Visit: Payer: BC Managed Care – PPO

## 2021-03-03 NOTE — Telephone Encounter (Signed)
Oral Chemotherapy Pharmacist Encounter  I spoke with patient for overview of: Cabometyx for the treatment of metastatic renal cell carcinoma, planned duration until disease progression or unacceptable toxicity.   Counseled patient on administration, dosing, side effects, monitoring, drug-food interactions, safe handling, storage, and disposal.  Patient will take Cabometyx 40mg  tablets, 1 tablet (40mg ) by mouth once daily on an empty stomach, 1 hour before or 2 hours after a meal.  Patient knows to avoid grapefruit and grapefruit juice.  Cabometyx start date: pending medication acquisition from [place]   Adverse effects include but are not limited to: diarrhea, nausea, decreased appetite, fatigue, hypertension, hand-foot syndrome, decreased blood counts, and electrolyte abnormalities. Patient will obtain anti diarrheal and alert the office of 4 or more loose stools above baseline.  Patient informed that Cabometyx should be held at least 3 weeks prior to any scheduled surgery (including dental surgery) and not resumed until at least 2 weeks after major surgery and until adequate wound healing is established.   Reviewed with patient importance of keeping a medication schedule and plan for any missed doses. No barriers to medication adherence identified.  Medication reconciliation performed and medication/allergy list updated.  Insurance authorization for Cabometyx has been obtained. Patient is required to fill [facility]. Patient provided phone number to set up medication shipment and also have assistance with applying for a copay card to help cover the out of pocket cost of Cabometyx (phone number: [phone])   All questions answered.  [name] voiced understanding and appreciation.   Medication education handout placed in mail for patient. Patient knows to call the office with questions or concerns. Oral Chemotherapy Clinic phone number  provided to patient.   Leron Croak, PharmD, BCPS Hematology/Oncology Clinical Pharmacist [facility] [phone] 03/03/2021 11:13 AM

## 2021-03-08 ENCOUNTER — Other Ambulatory Visit: Payer: Self-pay | Admitting: Physical Medicine and Rehabilitation

## 2021-03-08 ENCOUNTER — Other Ambulatory Visit (HOSPITAL_COMMUNITY): Payer: Self-pay

## 2021-03-09 ENCOUNTER — Encounter: Payer: Self-pay | Admitting: Thoracic Surgery (Cardiothoracic Vascular Surgery)

## 2021-03-09 ENCOUNTER — Ambulatory Visit (INDEPENDENT_AMBULATORY_CARE_PROVIDER_SITE_OTHER): Payer: BC Managed Care – PPO | Admitting: Thoracic Surgery (Cardiothoracic Vascular Surgery)

## 2021-03-09 ENCOUNTER — Ambulatory Visit
Admission: RE | Admit: 2021-03-09 | Discharge: 2021-03-09 | Disposition: A | Discharge status: Home / Self Care | Payer: BC Managed Care – PPO | Source: Ambulatory Visit | Attending: Thoracic Surgery (Cardiothoracic Vascular Surgery) | Admitting: Thoracic Surgery (Cardiothoracic Vascular Surgery)

## 2021-03-09 ENCOUNTER — Other Ambulatory Visit: Payer: Self-pay

## 2021-03-09 ENCOUNTER — Other Ambulatory Visit: Payer: Self-pay | Admitting: *Deleted

## 2021-03-09 VITALS — BP 120/70 | HR 116 | Resp 20 | Ht 72.0 in | Wt 170.0 lb

## 2021-03-09 DIAGNOSIS — J90 Pleural effusion, not elsewhere classified: Secondary | ICD-10-CM

## 2021-03-09 MED ORDER — CEPHALEXIN 500 MG PO CAPS: 500.0000 mg | ORAL_CAPSULE | Freq: Three times a day (TID) | ORAL | 0 refills | Status: DC

## 2021-03-09 NOTE — Progress Notes (Signed)
[address]       [place] [address]             [phone]     HPI: [name] returns for a scheduled follow-up visit  [name] is a 55 year old man with a history of colon cancer, hypertension, hyperlipidemia, and stage IV renal cell carcinoma.  He presented with a large pleural effusion.  He had multiple thoracenteses.  Cytology of the fluid was negative for CT-guided biopsy of a pleural mass was positive for metastatic renal cell carcinoma.  I placed a pleural catheter on 02/15/2021.  Initial placement drained about a liter but did not appear to be in connection with a significant part of the fluid.  Repositioning resulted in drainage of about 800 mL of additional fluid.  There was a new area of opacity superiorly whether this represented mass or loculated fluid was unclear.  Since placement he has been having minimal drainage.  He has been draining every other day.  The last couple of times there has been less than 100 mL.  He does still have dyspnea.  Last week he noted significant swelling in his left leg.  He had a duplex at [facility] in [place].  That was negative for DVT.  Past Medical History:  Diagnosis Date  . Anemia   . Cancer [facility])    colon cancer  . Colon cancer ([place])   . Dyspnea   . GIB (gastrointestinal bleeding)   . High cholesterol   . History of colon cancer 01/23/2021  . HTN (hypertension)     Current Outpatient Medications  Medication Sig Dispense Refill  . albuterol (VENTOLIN HFA) 108 (90 Base) MCG/ACT inhaler Inhale 2 puffs into the lungs every 4 (four) hours as needed for shortness of breath or wheezing.    Marland Kitchen amLODipine (NORVASC) 5 MG tablet Take 5 mg by mouth daily.    . cabozantinib (CABOMETYX) 40 MG tablet Take 1 tablet (40 mg total) by mouth daily. Take on an empty stomach, 1 hour before or 2 hours after meals. 30 tablet 1  . cephALEXin (KEFLEX) 500 MG capsule Take 1 capsule (500 mg total) by mouth 3 (three) times daily. 21  capsule 0  . meclizine (ANTIVERT) 12.5 MG tablet TAKE 1 TABLET (12.5 MG TOTAL) BY MOUTH 3 (THREE) TIMES DAILY AS NEEDED FOR DIZZINESS. 90 tablet 3  . meloxicam (MOBIC) 7.5 MG tablet TAKE 1 TABLET BY MOUTH EVERY DAY AS NEEDED FOR PAIN 30 tablet 1  . ondansetron (ZOFRAN) 8 MG tablet Take 1 tablet (8 mg total) by mouth every 8 (eight) hours as needed for nausea. 30 tablet 3  . oxyCODONE (OXY IR/ROXICODONE) 5 MG immediate release tablet Take 1 tablet (5 mg total) by mouth every 6 (six) hours as needed (postop pain). 28 tablet 0  . pantoprazole (PROTONIX) 40 MG tablet Take 40 mg by mouth daily.    . sennosides-docusate sodium (SENOKOT-S) 8.6-50 MG tablet Take 1 tablet by mouth daily.     No current facility-administered medications for this visit.    Physical Exam BP 120/70 (BP Location: Right Arm, Patient Position: Sitting, Cuff Size: Normal)   Pulse (!) 116   Resp 20   Ht 6' (1.829 m)   Wt 170 lb (77.1 kg)   SpO2 91% Comment: 2LNC  BMI 23.10 kg/m  55 year old man ill-appearing Alert and oriented with no focal deficits Cardiac tachycardic and regular Lungs diminished breath sounds on right Catheter site with mild  erythema along insertion tract  Diagnostic Tests: I personally reviewed the chest x-ray images.  It reveals loculated pleural effusions versus pleural masses. Reviewed report of ultrasound left lower lobe extremity from [facility].  No evidence of DVT.  Impression: [name] is a 55 year old man with history of colon cancer and now with stage IV renal cell carcinoma with pleural masses and a malignant pleural effusion.  He had undergone 4 thoracenteses in very rapid succession.  I placed a pleural catheter about 3 weeks ago.  On initial placement we drained a liter but with repositioning we drained an additional 800 mL of fluid.  He really has not drained very much since then.  His chest x-ray today shows some opacity with only a portion of the mid lung reexpanded.  It is  unclear if this represents pleural masses or his loculated pleural effusion.  If it is loculated pleural effusion we could potentially treat with thrombolytics via the catheter.  If that the pleural masses there is really not much we can do.  I think we need a CT scan to sort that out and see if there is anything we can do to provide him with symptomatic relief.  He does still have some erythema along the tract between the insertion site and the exit site.  That appears improved compared to the picture that was messaged me last week.  He was treated with 5 days of doxycycline.  I am going to give him a week of Keflex 500 mg 3 times daily to try and clear that up completely.  Left lower extremity swelling.  Developed last week.  Had a negative duplex.  He says it has been stable.  Recommend elevation and compression stocking   Plan: Keflex 500 mg p.o. 3 times daily for 7 days CT chest to evaluate complex pleural space effusion versus masses Return in 1 week to discuss results  Melrose Nakayama, MD [facility] [phone]

## 2021-03-09 NOTE — Telephone Encounter (Signed)
Oral Chemotherapy Pharmacist Encounter   Spoke with patient today to follow up regarding patient's oral chemotherapy medication: Cabometyx (cabozantinib)  Original Start date of oral chemotherapy: patient has not started as of 03/09/21  Confirmed with [facility] that patient had not set up shipment for new medication, Cabometyx. Called to follow-up with patient this AM - he stated he plans to call to set up shipment this after appointment this afternoon. Confirmed with Mr. Barg that he still has the correct phone number to [place] [phone]) to call and set up shipment for Cabometyx.   Leron Croak, PharmD, BCPS Hematology/Oncology Clinical Pharmacist [facility] [phone] 03/09/2021 11:20 AM

## 2021-03-12 ENCOUNTER — Other Ambulatory Visit: Payer: Self-pay

## 2021-03-12 ENCOUNTER — Ambulatory Visit (HOSPITAL_COMMUNITY)
Admission: RE | Admit: 2021-03-12 | Discharge: 2021-03-12 | Disposition: A | Discharge status: Home / Self Care | Payer: BC Managed Care – PPO | Source: Ambulatory Visit | Attending: Thoracic Surgery (Cardiothoracic Vascular Surgery) | Admitting: Thoracic Surgery (Cardiothoracic Vascular Surgery)

## 2021-03-12 ENCOUNTER — Telehealth: Payer: Self-pay

## 2021-03-12 DIAGNOSIS — J90 Pleural effusion, not elsewhere classified: Secondary | ICD-10-CM | POA: Insufficient documentation

## 2021-03-12 NOTE — Telephone Encounter (Signed)
-----   Message from Melrose Nakayama, MD sent at 03/12/2021  3:55 PM EDT ----- Regarding: RE: CT results He has rapidly progressive disease and needs to follow up with his oncologist ASAP  [facility] ----- Message ----- From: Marylen Ponto, LPN Sent: 3/66/4403   2:29 PM EDT To: Melrose Nakayama, MD Subject: CT results                                     [facility] called with the report from Chest CT completed today. Please review and advise if needed. He is schedule to see you next week./ thanks Linden Dolin

## 2021-03-16 ENCOUNTER — Inpatient Hospital Stay (HOSPITAL_BASED_OUTPATIENT_CLINIC_OR_DEPARTMENT_OTHER): Payer: BC Managed Care – PPO | Admitting: Hematology and Oncology

## 2021-03-16 ENCOUNTER — Other Ambulatory Visit: Payer: Self-pay | Admitting: Physical Medicine and Rehabilitation

## 2021-03-16 ENCOUNTER — Telehealth: Payer: Self-pay | Admitting: Hematology and Oncology

## 2021-03-16 ENCOUNTER — Telehealth: Payer: Self-pay

## 2021-03-16 ENCOUNTER — Inpatient Hospital Stay: Payer: BC Managed Care – PPO

## 2021-03-16 ENCOUNTER — Inpatient Hospital Stay: Payer: BC Managed Care – PPO | Admitting: Nutrition

## 2021-03-16 ENCOUNTER — Encounter: Payer: Self-pay | Admitting: Hematology and Oncology

## 2021-03-16 ENCOUNTER — Telehealth: Payer: Self-pay | Admitting: Nutrition

## 2021-03-16 ENCOUNTER — Encounter: Payer: Self-pay | Admitting: Nutrition

## 2021-03-16 ENCOUNTER — Ambulatory Visit: Payer: BC Managed Care – PPO | Admitting: Nutrition

## 2021-03-16 ENCOUNTER — Other Ambulatory Visit: Payer: Self-pay

## 2021-03-16 DIAGNOSIS — D649 Anemia, unspecified: Secondary | ICD-10-CM

## 2021-03-16 DIAGNOSIS — C7802 Secondary malignant neoplasm of left lung: Secondary | ICD-10-CM | POA: Diagnosis not present

## 2021-03-16 DIAGNOSIS — C782 Secondary malignant neoplasm of pleura: Secondary | ICD-10-CM | POA: Diagnosis not present

## 2021-03-16 DIAGNOSIS — C7801 Secondary malignant neoplasm of right lung: Secondary | ICD-10-CM | POA: Diagnosis not present

## 2021-03-16 DIAGNOSIS — Z7189 Other specified counseling: Secondary | ICD-10-CM | POA: Diagnosis not present

## 2021-03-16 DIAGNOSIS — C649 Malignant neoplasm of unspecified kidney, except renal pelvis: Secondary | ICD-10-CM

## 2021-03-16 DIAGNOSIS — R634 Abnormal weight loss: Secondary | ICD-10-CM

## 2021-03-16 DIAGNOSIS — E785 Hyperlipidemia, unspecified: Secondary | ICD-10-CM | POA: Diagnosis not present

## 2021-03-16 DIAGNOSIS — C799 Secondary malignant neoplasm of unspecified site: Secondary | ICD-10-CM

## 2021-03-16 DIAGNOSIS — I10 Essential (primary) hypertension: Secondary | ICD-10-CM | POA: Diagnosis not present

## 2021-03-16 DIAGNOSIS — Z87891 Personal history of nicotine dependence: Secondary | ICD-10-CM | POA: Diagnosis not present

## 2021-03-16 DIAGNOSIS — J90 Pleural effusion, not elsewhere classified: Secondary | ICD-10-CM | POA: Diagnosis not present

## 2021-03-16 DIAGNOSIS — Z809 Family history of malignant neoplasm, unspecified: Secondary | ICD-10-CM | POA: Diagnosis not present

## 2021-03-16 DIAGNOSIS — C642 Malignant neoplasm of left kidney, except renal pelvis: Secondary | ICD-10-CM | POA: Diagnosis not present

## 2021-03-16 DIAGNOSIS — H8113 Benign paroxysmal vertigo, bilateral: Secondary | ICD-10-CM

## 2021-03-16 DIAGNOSIS — C778 Secondary and unspecified malignant neoplasm of lymph nodes of multiple regions: Secondary | ICD-10-CM | POA: Diagnosis not present

## 2021-03-16 DIAGNOSIS — I313 Pericardial effusion (noninflammatory): Secondary | ICD-10-CM | POA: Diagnosis not present

## 2021-03-16 DIAGNOSIS — C787 Secondary malignant neoplasm of liver and intrahepatic bile duct: Secondary | ICD-10-CM | POA: Diagnosis not present

## 2021-03-16 LAB — CBC WITH DIFFERENTIAL/PLATELET
Abs Immature Granulocytes: 0.05 10*3/uL (ref 0.00–0.07)
Basophils Absolute: 0 10*3/uL (ref 0.0–0.1)
Basophils Relative: 0 %
Eosinophils Absolute: 0 10*3/uL (ref 0.0–0.5)
Eosinophils Relative: 0 %
HCT: 28 % — ABNORMAL LOW (ref 39.0–52.0)
Hemoglobin: 8.8 g/dL — ABNORMAL LOW (ref 13.0–17.0)
Immature Granulocytes: 1 %
Lymphocytes Relative: 7 %
Lymphs Abs: 0.7 10*3/uL (ref 0.7–4.0)
MCH: 24.8 pg — ABNORMAL LOW (ref 26.0–34.0)
MCHC: 31.4 g/dL (ref 30.0–36.0)
MCV: 78.9 fL — ABNORMAL LOW (ref 80.0–100.0)
Monocytes Absolute: 0.7 10*3/uL (ref 0.1–1.0)
Monocytes Relative: 6 %
Neutro Abs: 9.4 10*3/uL — ABNORMAL HIGH (ref 1.7–7.7)
Neutrophils Relative %: 86 %
Platelets: 676 10*3/uL — ABNORMAL HIGH (ref 150–400)
RBC: 3.55 MIL/uL — ABNORMAL LOW (ref 4.22–5.81)
RDW: 16.1 % — ABNORMAL HIGH (ref 11.5–15.5)
WBC: 10.8 10*3/uL — ABNORMAL HIGH (ref 4.0–10.5)
nRBC: 0 % (ref 0.0–0.2)

## 2021-03-16 LAB — CMP (CANCER CENTER ONLY)
ALT: 26 U/L (ref 0–44)
AST: 27 U/L (ref 15–41)
Albumin: 2.3 g/dL — ABNORMAL LOW (ref 3.5–5.0)
Alkaline Phosphatase: 93 U/L (ref 38–126)
Anion gap: 14 (ref 5–15)
BUN: 34 mg/dL — ABNORMAL HIGH (ref 6–20)
CO2: 25 mmol/L (ref 22–32)
Calcium: 9.8 mg/dL (ref 8.9–10.3)
Chloride: 98 mmol/L (ref 98–111)
Creatinine: 1.74 mg/dL — ABNORMAL HIGH (ref 0.61–1.24)
GFR, Estimated: 46 mL/min — ABNORMAL LOW (ref >60)
Glucose, Bld: 110 mg/dL — ABNORMAL HIGH (ref 70–99)
Potassium: 6 mmol/L — ABNORMAL HIGH (ref 3.5–5.1)
Sodium: 137 mmol/L (ref 135–145)
Total Bilirubin: 0.6 mg/dL (ref 0.3–1.2)
Total Protein: 7.8 g/dL (ref 6.5–8.1)

## 2021-03-16 LAB — LACTATE DEHYDROGENASE: LDH: 368 U/L — ABNORMAL HIGH (ref 98–192)

## 2021-03-16 NOTE — Telephone Encounter (Signed)
Patient did not want to stay for nutrition appointment after MD today.  MD requested I telephone patient. I was able to reach patient on his mobile phone.  He reports he does not like any of the oral nutrition supplements that have been provided for him.  He states he can only eat "bites and pieces".  He tolerates and enjoys meatballs, milk on cereal, ice cream, eggs, fruits, and cheese.  Patient was coughing throughout conversation so it was difficult to talk in depth about intake.  Noted decreased weight at 168.2 pounds May 17 down from 176.3 pounds April 25 and 212 pounds October 2021. Severe wt loss.  Nutrition diagnosis: Unintended weight loss continues.  Intervention: Reviewed specific foods for patient to incorporate into his meals and snacks based on food preferences.  I assisted with paring foods to provide higher calorie consumption while maintaining patient preference. Discouraged further weight loss.  Teach back method used.  Monitoring, evaluation, goals: Patient will work to increase calories and protein to minimize weight loss.  Next visit: To be scheduled as needed.  **Disclaimer: This note was dictated with voice recognition software. Similar sounding words can inadvertently be transcribed and this note may contain transcription errors which may not have been corrected upon publication of note.**

## 2021-03-16 NOTE — Progress Notes (Signed)
Patient did not show up for scheduled nutrition appointment. 

## 2021-03-16 NOTE — Assessment & Plan Note (Addendum)
He has lost about 8 pounds since his last visit.  This is again related to poor p.o. intake, loss of appetite from metastatic cancer.  I am worried that he is nearing the end of life but is reluctant to proceed with comfort care.  I encouraged him to try small meals and energy supplementssuch as Ensure and boost.  I will ask our nutritionist to give him a call, he was in a rush to leave after our appointment

## 2021-03-16 NOTE — Telephone Encounter (Signed)
Scheduled per los. Gave avs and calendar  

## 2021-03-16 NOTE — Assessment & Plan Note (Addendum)
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 He was found to have metastatic carcinoma, pathology suggestive of RCC, papillary subtype. Given papillary sub type, we have discussed front line cabozantinib vs caboxantinib plus nivolumab.  Given his PS,we agreed to start with single agent cabozatinib. We discussed about common side effects such as fatigue, skin rash, HTN, electrolyte abnormalities, diarrhea, stomatitis, hair discoloration and occasionally alopecia. I have discussed with him multiple times about the need to start treatment ASAP. Our pharmacy team has tried to contact him several times.  He has not yet started cabozantinib, have not given called pharmacy to dispense the medication. He appears to have progression of disease from most recent CT, he continues to decline in terms of his performance status, now wheelchair dependent, has worsening shortness of breath, fatigue and weight loss. Today we discussed considering palliative care and hospice given rapid progression and his reluctance to start treatment. He is very reluctant to proceed with hospice.  I have discussed that given his worsening disease and rapidly declining PS, he has limited time and its not unreasonable to consider spending it without any discomfort.  Again he refuses hospice.  But at the same time he is not confident he would like to take the medication. He would still like to try cabozantinib, encouraged to try as soon as possible if he really would like to proceed but I am not quite sure if this would help him at this point of time.  We have previously discussed about side effects in detail. CBC and CMP satisfactory,

## 2021-03-16 NOTE — Progress Notes (Signed)
Opened in error

## 2021-03-16 NOTE — Telephone Encounter (Signed)
-----   Message from Jolaine Click, RN sent at 03/16/2021  3:04 PM EDT -----  ----- Message ----- From: Benay Pike, MD Sent: 03/16/2021   1:52 PM EDT To: Arlice Colt Pod [address]  Please call him and talk to him. If he is planning to take the cabozantinib, then his liver and kidney function is considered adequate. If he is not planning to take it, then he should let us know.  [place],

## 2021-03-16 NOTE — Assessment & Plan Note (Signed)
I have reviewed his labs, no indication for transfusion.  This is likely secondary to the metastatic renal cell carcinoma.  There is no concern for an immediate bleed.

## 2021-03-16 NOTE — Assessment & Plan Note (Addendum)
We have again discussed that he has a very advanced renal cell carcinoma and he continues to decline.  He has not yet started his treatment and he is reluctant about considering it.  I have discussed about palliative care and hospice since he has rapidly progressive disease.  He is reluctant to proceed with palliative care and hospice at this time.  I have strongly encouraged him to think about it. He is also very reluctant about talking to his family regarding the diagnosis and prognosis.

## 2021-03-16 NOTE — Progress Notes (Signed)
[facility] FOLLOW UP NOTE  Patient Care Team: Glenda Chroman, MD as PCP - General (Internal Medicine) [name], MD as PCP - Cardiology (Cardiology) Benay Pike, MD as Consulting Physician (Hematology and Oncology)  CHIEF COMPLAINTS/PURPOSE OF CONSULTATION:  Metastatic RCC, follow up.  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 He was found to have metastatic carcinoma, pathology suggestive of RCC, papillary subtype. Given papillary sub type, we have discussed front line cabozantinib vs caboxantinib plus nivolumab.  Given his PS,we agreed to start with single agent cabozatinib. We discussed about common side effects such as fatigue, skin rash, HTN, electrolyte abnormalities, diarrhea, stomatitis, hair discoloration and occasionally alopecia. I have discussed with him multiple times about the need to start treatment ASAP. Our pharmacy team has tried to contact him several times.  He has not yet started cabozantinib, have not given called pharmacy to dispense the medication. He appears to have progression of disease from most recent CT, he continues to decline in terms of his performance status, now wheelchair dependent, has worsening shortness of breath, fatigue and weight loss. Today we discussed considering palliative care and hospice given rapid progression and his reluctance to start treatment. He is very reluctant to proceed with hospice.  I have discussed that given his worsening disease and rapidly declining PS, he has limited time and its not unreasonable to consider spending it without any discomfort.  Again he refuses hospice.  But at the same time he is not confident he would like to take the medication. He would still like to try cabozantinib, encouraged to try as soon as possible if  he really would like to proceed but I am not quite sure if this would help him at this point of time.  We have previously discussed about side effects in detail. CBC and CMP satisfactory,   Normocytic normochromic anemia I have reviewed his labs, no indication for transfusion.  This is likely secondary to the metastatic renal cell carcinoma.  There is no concern for an immediate bleed.  Goals of care, counseling/discussion We have again discussed that he has a very advanced renal cell carcinoma and he continues to decline.  He has not yet started his treatment and he is reluctant about considering it.  I have discussed about palliative care and hospice since he has rapidly progressive disease.  He is reluctant to proceed with palliative care and hospice at this time.  I have strongly encouraged him to think about it.  Unintentional weight loss of more than 10 pounds He has lost about 8 pounds since his last visit.  This is again related to poor p.o. intake, loss of appetite from metastatic cancer.  I am worried that he is nearing the end of life but is reluctant to proceed with comfort care.  I encouraged him to try small meals and energy supplementssuch as Ensure and boost.  I will also send a referral to nutrition.   Orders Placed This Encounter  Procedures  . CBC with Differential/Platelet    Standing Status:   Standing    Number of Occurrences:   22    Standing Expiration Date:   03/16/2022  . CMP ([facility] only)    Standing Status:   Future    Number of Occurrences:   1    Standing Expiration  Date:   03/16/2022  . Lactate dehydrogenase    Standing Status:   Future    Number of Occurrences:   1    Standing Expiration Date:   03/16/2022     HISTORY OF PRESENTING ILLNESS:  [name] 55 y.o. male is here because of metastatic [place]  Oncology History Overview Note  [name] is a 55 year old male with a past medical history significant for colon cancer (status post  hemicolectomy in 2015 -previously followed by Dr. Jacquiline Doe at [facility]), hyperlipidemia, hypertension.  The patient was transferred from [facility] emergency department to [facility] due to acute hypoxic respiratory failure.  The patient has a 3-week history of cough which progressively worsened.  He subsequently developed shortness of breath. He has a recent 15-20 pound unintentional weight loss in the past few weeks and decreased oral intake due to lack of appetite and generalized weakness.  CT chest with contrast was performed on 01/22/2021 at [facility] and reviewed through care everywhere.  This showed a large solid heterogeneous mass arising off the upper pole of the left kidney consistent with primary renal cell carcinoma, tumor extension into the left renal vein identified, left retroperitoneal metastatic adenopathy, extensive nodal metastasis within the mediastinum and bilateral hilar regions, bilateral pleural metastasis with large malignant right pleural effusion, multiple pulmonary nodules identified compatible with pulmonary metastasis, small to moderate pericardial effusion.  The patient had a CT abdomen/pelvis performed 01/23/2021 which showed a 15.1 cm left upper pole renal mass compatible with advanced renal cell carcinoma, expansile tumor thrombus the left renal vein, so see the left para-aortic nodal metastases, small hepatic metastases, bilateral pulmonary metastases, extensive pleural metastases in the right hemithorax with moderate to large pleural effusion, thoracic nodal metastases.  The patient had ultrasound-guided thoracentesis performed 01/23/2021 with 2.5 L of fluid removed and a repeat thoracentesis performed earlier today with 2 L of fluid removed. Fluid from thoracentesis negative for malignant cells.  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    Metastatic renal cell carcinoma ([place])  01/23/2021 Initial Diagnosis   Metastatic renal cell carcinoma [facility])     INTERIM HISTORY  Mr. Stecher is here for follow-up.  Since his last visit, he has not really called the pharmacy to get cabozantinib.  He has been procrastinating, states he is worried about the side effects of the medication.  He has gotten worse since his last visit here, currently in a wheelchair.  His shortness of breath has gotten worse, using 2 to 3 L of oxygen 24 x 7, most recently went to follow-up with his surgeon for worsening shortness of breath and he had repeat imaging of the chest which showed disease progression.  He has no appetite, continues to lose weight.  He denies any pain.  He just feels very tired, very swollen lower extremities, shortness of breath and weight loss.  Rest of the pertinent 10 point ROS reviewed and negative.   MEDICAL HISTORY:  Past Medical History:  Diagnosis Date  . Anemia   . Cancer [facility])    colon cancer  . Colon cancer ([place])   . Dyspnea   . GIB (gastrointestinal bleeding)   . High cholesterol   . History of colon cancer 01/23/2021  . HTN (hypertension)     SURGICAL HISTORY: Past Surgical History:  Procedure Laterality Date  . CHEST TUBE INSERTION Right 02/15/2021  Procedure: INSERTION PLEURAL DRAINAGE CATHETER;  Surgeon: Melrose Nakayama, MD;  Location: [facility];  Service: Thoracic;  Laterality: Right;  . COLON RESECTION    . HEMICOLECTOMY Right   . HERNIA REPAIR    . IR THORACENTESIS ASP PLEURAL SPACE W/IMG GUIDE  01/25/2021    SOCIAL HISTORY: Social History   Socioeconomic History  . Marital status: Single    Spouse name: Not on file  . Number of children: Not on file  . Years of education: Not on file  . Highest education level: Not on file  Occupational History  . Not on file  Tobacco Use  . Smoking status: Former Smoker    Quit date: 2006    Years since quitting: 16.3  . Smokeless tobacco: Never  Used  Vaping Use  . Vaping Use: Never used  Substance and Sexual Activity  . Alcohol use: No  . Drug use: No  . Sexual activity: Not Currently  Other Topics Concern  . Not on file  Social History Narrative  . Not on file   Social Determinants of Health   Financial Resource Strain: Not on file  Food Insecurity: Not on file  Transportation Needs: Not on file  Physical Activity: Not on file  Stress: Not on file  Social Connections: Not on file  Intimate Partner Violence: Not on file    FAMILY HISTORY: Family History  Problem Relation Age of Onset  . Hypertension Mother   . Hypertension Sister   . Hypertension Brother   . Heart attack Cousin   . Cancer Father     ALLERGIES:  has No Known Allergies.  MEDICATIONS:  Current Outpatient Medications  Medication Sig Dispense Refill  . albuterol (VENTOLIN HFA) 108 (90 Base) MCG/ACT inhaler Inhale 2 puffs into the lungs every 4 (four) hours as needed for shortness of breath or wheezing.    . cabozantinib (CABOMETYX) 40 MG tablet Take 1 tablet (40 mg total) by mouth daily. Take on an empty stomach, 1 hour before or 2 hours after meals. 30 tablet 1  . furosemide (LASIX) 40 MG tablet Take by mouth.    . meclizine (ANTIVERT) 12.5 MG tablet TAKE 1 TABLET (12.5 MG TOTAL) BY MOUTH 3 (THREE) TIMES DAILY AS NEEDED FOR DIZZINESS. 90 tablet 3  . meloxicam (MOBIC) 7.5 MG tablet TAKE 1 TABLET BY MOUTH EVERY DAY AS NEEDED FOR PAIN 30 tablet 1  . ondansetron (ZOFRAN) 8 MG tablet Take 1 tablet (8 mg total) by mouth every 8 (eight) hours as needed for nausea. 30 tablet 3  . oxyCODONE (OXY IR/ROXICODONE) 5 MG immediate release tablet Take 1 tablet (5 mg total) by mouth every 6 (six) hours as needed (postop pain). 28 tablet 0  . OXYGEN Inhale 2 L into the lungs.    . pantoprazole (PROTONIX) 40 MG tablet Take 40 mg by mouth daily.    . potassium chloride (KLOR-CON) 10 MEQ tablet Take 10 mEq by mouth daily as needed.     No current  facility-administered medications for this visit.      PHYSICAL EXAMINATION: ECOG PERFORMANCE STATUS: 2 - Symptomatic, <50% confined to bed  Borderline 3  Vitals:   03/16/21 0903  BP: (!) 141/80  Pulse: (!) 117  Resp: 18  Temp: 97.7 F (36.5 C)  SpO2: 90%   Filed Weights   03/16/21 0903  Weight: 168 lb 3.2 oz (76.3 kg)    GENERAL:alert, frail in moderate respiratory distress in wheelchair NECK: supple, thyroid normal size, non-tender,  without nodularity LYMPH:  no palpable lymphadenopathy in the cervical, axillary LUNGS: No or minimal air entry right base  HEART: Sinus tachycardia, bilateral severe lower extremity pitting edema 2+ ABDOMEN: Abdomen appears distended, limited exam given his sitting position in wheelchair  PSYCH: alert & oriented x 3 with fluent speech NEURO: no focal motor/sensory deficits  LABORATORY DATA:  I have reviewed the data as listed Lab Results  Component Value Date   WBC 10.8 (H) 03/16/2021   HGB 8.8 (L) 03/16/2021   HCT 28.0 (L) 03/16/2021   MCV 78.9 (L) 03/16/2021   PLT 676 (H) 03/16/2021     Chemistry      Component Value Date/Time   NA 137 03/16/2021 0950   K 6.0 (H) 03/16/2021 0950   CL 98 03/16/2021 0950   CO2 25 03/16/2021 0950   BUN 34 (H) 03/16/2021 0950   CREATININE 1.74 (H) 03/16/2021 0950      Component Value Date/Time   CALCIUM 9.8 03/16/2021 0950   ALKPHOS 93 03/16/2021 0950   AST 27 03/16/2021 0950   ALT 26 03/16/2021 0950   BILITOT 0.6 03/16/2021 0950       RADIOGRAPHIC STUDIES: I have personally reviewed the radiological images as listed and agreed with the findings in the report. DG Chest 2 View  Result Date: 03/09/2021 CLINICAL DATA:  PleurX catheter placement EXAM: CHEST - 2 VIEW COMPARISON:  February 12, 2021 FINDINGS: PleurX catheter placed on the right. No pneumothorax. Large mass right upper lobe remains. Suspected loculated pleural effusion with areas of consolidation throughout the right lung appear  similar to recent study. On the left, there is a small pleural effusion. There is ill-defined opacity in the left upper lobe, likely representing pneumonia. Slightly increased opacity in this area compared to 1 month prior. Heart is prominent, stable, with pulmonary vascularity normal. Suspected hilar adenopathy bilaterally. Soft tissue fullness left paratracheal region, likely due to adenopathy. No bone lesions IMPRESSION: PleurX catheter placed on the right without pneumothorax. Mass lesions and areas of suspected adenopathy consistent with known underlying neoplasm. Opacity throughout much of the right lung consistent with combination of areas of pleural effusion in airspace consolidation. Increased opacity left upper lobe, likely pneumonia, although neoplasm underlying is possible in this area. Small left pleural effusion. Stable cardiac silhouette. Electronically Signed   By: Lowella Grip III M.D.   On: 03/09/2021 12:47   CT CHEST WO CONTRAST  Result Date: 03/12/2021 CLINICAL DATA:  Abnormal fall chest x-ray with pleural fluid and masslike areas in the chest. EXAM: CT CHEST WITHOUT CONTRAST TECHNIQUE: Multidetector CT imaging of the chest was performed following the standard protocol without IV contrast. COMPARISON:  Chest x-ray of Mar 09, 2021 and prior imaging of the abdomen and pelvis from March of 2022 as well as biopsy images from April of 2022. FINDINGS: Cardiovascular: Shift of heart mediastinal structures from RIGHT to LEFT, see below. Pericardial effusion with similar volume. Pericardial nodularity in adjacent adenopathy increased. Mediastinum/Nodes: Thoracic inlet structures are normal. No axillary lymphadenopathy. Bulky subcarinal adenopathy measuring as much as 4.0 x 6.1 cm, previously 3.2 cm short axis. AP window adenopathy increased (image 65/2) this could be a mixture of AP window lymph nodes and adjacent pleural disease but measuring approximately 2.5 cm greatest thickness as compared  to 2 cm on the previous abdominal CT. Lungs/Pleura: Heart is shifted into the LEFT chest due to worsening pleural disease and enlarging effusion in the RIGHT chest. Loculated appearance of fluid and mixed density in  the RIGHT upper chest corresponds to the chest x-ray abnormality of convex protrusions along the upper margin of the PleurX catheter. The PleurX catheter is seen in the posterior RIGHT chest. This is situated more in the anterior RIGHT chest in fills the RIGHT lung apex. Pleural thickening in the RIGHT lower chest is markedly increased compared to previous imaging of just 2 months ago, now circumferential throughout the RIGHT chest. In the anterior chest in the cardiophrenic recess there is near confluent disease that tracks along the pleural space peripherally and involves collapsed lung in pleural surfaces including the major fissure tracking into the RIGHT lung apex. This measures in total approximately 8.3 x 8.6 cm greatest axial dimension previously individual nodules in this area while not insignificant ranged in size from 1 cm to 2 cm and in total while not confluent on the prior study measuring approximately 5.4 x 3.4 cm in axial dimension. Shift of mediastinum may be slightly increased but is present on the prior study as well. In addition to circumferential soft tissue with increase in the RIGHT chest there is increasing pleural fluid in the LEFT chest with signs of pleural involvement (image 28/2) 1.8 cm pleural base nodule. (Image 61/2 1.8 x 1.1 cm pleural base nodule. Similar size nodule in the anterolateral LEFT upper chest. Diminished aeration of lung in the RIGHT chest compared to previous imaging. Septal thickening and ground-glass in the RIGHT middle lobe. Minimal aeration of the RIGHT lower lobe. Volume loss also in the RIGHT upper lobe due to worsening disease Pleural nodularity the extends along the fissure in the LEFT chest as well. Major airways are patent with narrowing of  peripheral airways in the RIGHT chest. Upper Abdomen: Large LEFT renal mass not well evaluated better seen on recent CT. Bulky para-aortic adenopathy and renal venous extension also better evaluated on recent CT. Expansion of the LEFT renal vein may be increased is a crosses the midline, difficult to assess. Increasing size of hepatic metastatic lesions and likely with new hepatic metastatic disease. On image 144 of series 2 measuring 5 x 2.3 cm this previously measured approximately 2.9 x 2.0 cm. New lesions are suspected with numerous low-attenuation foci now seen throughout the liver. For instance on image 178 of series 2 is a 16 mm lesion not seen on the previous study. Musculoskeletal: No acute musculoskeletal process. No destructive bone finding IMPRESSION: 1. Marked interval worsening of pleural disease particularly in the RIGHT chest, likely associated with small amounts of hemorrhage scattered throughout a loculated area in the superior aspect of the RIGHT chest accounting for the convex abnormality seen on chest x-ray above the PleurX catheter. 2. PleurX catheter terminates in the upper to mid chest below this area likely tracking along the major fissure though difficult to determine given the extent of pleural disease. 3. Associated volume loss in the RIGHT chest as described. Area of either lymphangitic tumor spread in the RIGHT middle lobe versus postobstructive pneumonitis. 4. Signs of new and worsening hepatic metastatic disease. 5. Similar volume of pericardial effusion. These results will be called to the ordering clinician or representative by the Radiologist Assistant, and communication documented in the PACS or [employer]. Electronically Signed   By: Zetta Bills M.D.   On: 03/12/2021 14:18   MR BRAIN W WO CONTRAST  Result Date: 02/28/2021 CLINICAL DATA:  Staging for renal cell carcinoma. EXAM: MRI HEAD WITHOUT AND WITH CONTRAST TECHNIQUE: Multiplanar, multiecho pulse sequences of the  brain and surrounding structures were obtained  without and with intravenous contrast. CONTRAST:  33mL GADAVIST GADOBUTROL 1 MMOL/ML IV SOLN COMPARISON:  None. FINDINGS: Brain: Diffusion imaging does not show any acute or subacute infarction or other cause of restricted diffusion. No focal abnormality affects the brainstem or cerebellum. Cerebral hemispheres are normal. No stroke, mass, hemorrhage, hydrocephalus or extra-axial collection. After contrast administration, no abnormal enhancement occurs. Vascular: Major vessels at the base of the brain show flow. Skull and upper cervical spine: Negative Sinuses/Orbits: Opacification of the right division of the sphenoid sinus. Other sinuses clear. Orbits negative. Other: None IMPRESSION: No evidence of metastatic disease.  Normal appearance of the brain. Fluid opacification of the right division of the sphenoid sinus. Electronically Signed   By: Nelson Chimes M.D.   On: 02/28/2021 20:38   DG C-Arm 1-60 Min-No Report  Result Date: 02/15/2021 Fluoroscopy was utilized by the requesting physician.  No radiographic interpretation.   Have reviewed his most recent CT chest imaging which showed marked interval worsening of pleural disease, concern for lymphangitic tumor spread, worsening hepatic metastatic disease and similar volume of pericardial effusion.  All questions were answered. The patient knows to call the clinic with any problems, questions or concerns.  I have spent over 40 minutes in the care of this patient including review of records, goals of care discussion, coordination with our team, reviewing labs.    Benay Pike, MD 03/16/2021 11:05 AM

## 2021-03-16 NOTE — Progress Notes (Signed)
I have left a message for the pt requesting a return call for his results.

## 2021-03-16 NOTE — Telephone Encounter (Signed)
I have left a message for this pt requesting a return call.

## 2021-03-17 ENCOUNTER — Telehealth: Payer: Self-pay

## 2021-03-17 ENCOUNTER — Other Ambulatory Visit (HOSPITAL_COMMUNITY): Payer: Self-pay

## 2021-03-17 ENCOUNTER — Ambulatory Visit (INDEPENDENT_AMBULATORY_CARE_PROVIDER_SITE_OTHER): Payer: BC Managed Care – PPO | Admitting: Thoracic Surgery (Cardiothoracic Vascular Surgery)

## 2021-03-17 VITALS — BP 136/78 | HR 113 | Resp 20 | Ht 72.0 in | Wt 160.0 lb

## 2021-03-17 DIAGNOSIS — J90 Pleural effusion, not elsewhere classified: Secondary | ICD-10-CM

## 2021-03-17 NOTE — Progress Notes (Signed)
[address]       [place] [address]             [phone]     HPI: [name] returns for follow-up of his malignant pleural effusion.  [name] is a 55 year old man with a history of colon cancer, hypertension, hyperlipidemia, and stage IV renal cell carcinoma.  He presented with a large pleural effusion.  He had multiple thoracenteses.  Cytology of the fluid was negative but he had a CT-guided biopsy of a pleural mass which showed metastatic renal cell carcinoma.  He was prescribed cabozantinib, but never started the medication.  I placed a pleural catheter on 02/15/2021.  With initial placement we drained about a liter but there appeared to be a significant component of fluid that was undrained so we reposition the catheter and drained an additional 800 mL of fluid.  I saw him in the office last week.  He was not draining much from the catheter.  His chest x-ray showed significantly increased opacity in the right pleural space, so I ordered a CT of the chest.  It did show some loculated fluid but primarily there was marked progression of his pleural-based masses.  He saw Dr. Malena Peer yesterday.  He finally agreed to start taking his cabozantinib.  They were able to work that out financially.  He is supposed to get the medication on Friday.  Past Medical History:  Diagnosis Date  . Anemia   . Cancer [facility])    colon cancer  . Colon cancer ([place])   . Dyspnea   . GIB (gastrointestinal bleeding)   . High cholesterol   . History of colon cancer 01/23/2021  . HTN (hypertension)     Current Outpatient Medications  Medication Sig Dispense Refill  . albuterol (VENTOLIN HFA) 108 (90 Base) MCG/ACT inhaler Inhale 2 puffs into the lungs every 4 (four) hours as needed for shortness of breath or wheezing.    . cabozantinib (CABOMETYX) 40 MG tablet Take 1 tablet (40 mg total) by mouth daily. Take on an empty stomach, 1 hour before or 2 hours after meals. 30 tablet 1  .  furosemide (LASIX) 40 MG tablet Take by mouth.    . meclizine (ANTIVERT) 12.5 MG tablet TAKE 1 TABLET (12.5 MG TOTAL) BY MOUTH 3 (THREE) TIMES DAILY AS NEEDED FOR DIZZINESS. 90 tablet 3  . meloxicam (MOBIC) 7.5 MG tablet TAKE 1 TABLET BY MOUTH EVERY DAY AS NEEDED FOR PAIN 30 tablet 1  . ondansetron (ZOFRAN) 8 MG tablet Take 1 tablet (8 mg total) by mouth every 8 (eight) hours as needed for nausea. 30 tablet 3  . oxyCODONE (OXY IR/ROXICODONE) 5 MG immediate release tablet Take 1 tablet (5 mg total) by mouth every 6 (six) hours as needed (postop pain). 28 tablet 0  . OXYGEN Inhale 2 L into the lungs.    . pantoprazole (PROTONIX) 40 MG tablet Take 40 mg by mouth daily.    . potassium chloride (KLOR-CON) 10 MEQ tablet Take 10 mEq by mouth daily as needed.     No current facility-administered medications for this visit.    Physical Exam BP 136/78 (BP Location: Right Arm, Patient Position: Sitting)   Pulse (!) 113   Resp 20   Ht 6' (1.829 m)   Wt 160 lb (72.6 kg)   SpO2 90% Comment: 3L [place]  BMI 21.70 kg/m  Ill-appearing 54 year old man Some utilization of accessory muscles of breathing Absent breath sounds  right base, minimal elsewhere on the right Catheter in place  Diagnostic Tests: CT CHEST WITHOUT CONTRAST  TECHNIQUE: Multidetector CT imaging of the chest was performed following the standard protocol without IV contrast.  COMPARISON:  Chest x-ray of Mar 09, 2021 and prior imaging of the abdomen and pelvis from March of 2022 as well as biopsy images from April of 2022.  FINDINGS: Cardiovascular: Shift of heart mediastinal structures from RIGHT to LEFT, see below. Pericardial effusion with similar volume. Pericardial nodularity in adjacent adenopathy increased.  Mediastinum/Nodes: Thoracic inlet structures are normal. No axillary lymphadenopathy.  Bulky subcarinal adenopathy measuring as much as 4.0 x 6.1 cm, previously 3.2 cm short axis.  AP window adenopathy  increased (image 65/2) this could be a mixture of AP window lymph nodes and adjacent pleural disease but measuring approximately 2.5 cm greatest thickness as compared to 2 cm on the previous abdominal CT.  Lungs/Pleura: Heart is shifted into the LEFT chest due to worsening pleural disease and enlarging effusion in the RIGHT chest. Loculated appearance of fluid and mixed density in the RIGHT upper chest corresponds to the chest x-ray abnormality of convex protrusions along the upper margin of the PleurX catheter. The PleurX catheter is seen in the posterior RIGHT chest. This is situated more in the anterior RIGHT chest in fills the RIGHT lung apex. Pleural thickening in the RIGHT lower chest is markedly increased compared to previous imaging of just 2 months ago, now circumferential throughout the RIGHT chest. In the anterior chest in the cardiophrenic recess there is near confluent disease that tracks along the pleural space peripherally and involves collapsed lung in pleural surfaces including the major fissure tracking into the RIGHT lung apex. This measures in total approximately 8.3 x 8.6 cm greatest axial dimension previously individual nodules in this area while not insignificant ranged in size from 1 cm to 2 cm and in total while not confluent on the prior study measuring approximately 5.4 x 3.4 cm in axial dimension. Shift of mediastinum may be slightly increased but is present on the prior study as well.  In addition to circumferential soft tissue with increase in the RIGHT chest there is increasing pleural fluid in the LEFT chest with signs of pleural involvement (image 28/2) 1.8 cm pleural base nodule.  (Image 61/2 1.8 x 1.1 cm pleural base nodule. Similar size nodule in the anterolateral LEFT upper chest.  Diminished aeration of lung in the RIGHT chest compared to previous imaging. Septal thickening and ground-glass in the RIGHT middle lobe. Minimal aeration of the  RIGHT lower lobe. Volume loss also in the RIGHT upper lobe due to worsening disease  Pleural nodularity the extends along the fissure in the LEFT chest as well. Major airways are patent with narrowing of peripheral airways in the RIGHT chest.  Upper Abdomen: Large LEFT renal mass not well evaluated better seen on recent CT. Bulky para-aortic adenopathy and renal venous extension also better evaluated on recent CT. Expansion of the LEFT renal vein may be increased is a crosses the midline, difficult to assess.  Increasing size of hepatic metastatic lesions and likely with new hepatic metastatic disease. On image 144 of series 2 measuring 5 x 2.3 cm this previously measured approximately 2.9 x 2.0 cm.  New lesions are suspected with numerous low-attenuation foci now seen throughout the liver. For instance on image 178 of series 2 is a 16 mm lesion not seen on the previous study.  Musculoskeletal: No acute musculoskeletal process. No destructive bone finding  IMPRESSION: 1. Marked interval worsening of pleural disease particularly in the RIGHT chest, likely associated with small amounts of hemorrhage scattered throughout a loculated area in the superior aspect of the RIGHT chest accounting for the convex abnormality seen on chest x-ray above the PleurX catheter. 2. PleurX catheter terminates in the upper to mid chest below this area likely tracking along the major fissure though difficult to determine given the extent of pleural disease. 3. Associated volume loss in the RIGHT chest as described. Area of either lymphangitic tumor spread in the RIGHT middle lobe versus postobstructive pneumonitis. 4. Signs of new and worsening hepatic metastatic disease. 5. Similar volume of pericardial effusion.  These results will be called to the ordering clinician or representative by the Radiologist Assistant, and communication documented in the PACS or [employer].   Electronically Signed   By: Zetta Bills M.D.   On: 03/12/2021 14:18 I personally reviewed the CT images and concur with the findings noted above.  Impression: [name] is a 55 year old man with metastatic renal cell carcinoma with right pleural masses and malignant right pleural effusion.  We placed a pleural catheter about a month ago.  Unfortunately he never started his oral chemotherapy.  Since the catheter was placed he has had massive progression of his pleural masses and is becoming progressively more dyspneic.  He is having relatively low volumes of drainage from the catheter.  He saw Dr. Malena Peer yesterday.  Apparently on the payment issues for cabozantinib have been dealt with.  Hopefully he can start that medication on Friday.  The catheter is really providing relatively little benefit at this point but I recommend we leave that in for least a couple more weeks just to make sure he does not get into issues once his treatment starts.  Plan: Return in 2 weeks with PA lateral chest x-ray  Melrose Nakayama, MD [facility] [phone]

## 2021-03-17 NOTE — Telephone Encounter (Signed)
Patient provided with number to call to set up delivery for cabozantinib. Patient read back and confirmed phone number of the [facility]. Patient understands that he has to be the one to call to set up the delivery. Patient verbalized an understanding and stated that he would call them.

## 2021-03-17 NOTE — Telephone Encounter (Signed)
Returned patient's call and spoke to patient in regards to his desire to start on his oral chemo, cabozantinib. Patient requesting assistance with calling in and setting up delivery.  Consulting with oral chemo pharmacist to assist patient in setting this up. Dr. Chryl Heck aware.

## 2021-03-17 NOTE — Telephone Encounter (Signed)
Pace contacted [place] to let us know his copay was [facility]1500 at [facility].  I obtained a copay card for Pace, max amount $25,000/year  [facility] [place] ID [id number]  Grp [id number]  I called [facility] and gave them the info. They confirmed copay is now $0.  I also called Pace to have him call [facility] back to set up shipment.  Pace stated he would call.   Pace [name] Phone [phone] Fax [phone] 03/17/2021 11:37 AM

## 2021-03-18 ENCOUNTER — Telehealth: Payer: Self-pay

## 2021-03-18 NOTE — Telephone Encounter (Signed)
Discussed recent lab results with patient and Dr. Rob Hickman recommendations. Patient verbalized an understanding of this information.  Also confirmed with patient that he had been able to schedule his oral chemo delivery date - patient stated that he had been able to schedule the delivery and that it set to be delivered tomorrow, 5/20.

## 2021-03-18 NOTE — Telephone Encounter (Signed)
-----   Message from Benay Pike, MD sent at 03/17/2021  1:56 PM EDT ----- Also his kidney numbers are getting worse, could be from dehydration and lasix. Would encourage drinking more water if he can tolerate it.

## 2021-03-23 ENCOUNTER — Inpatient Hospital Stay: Payer: BC Managed Care – PPO

## 2021-03-23 ENCOUNTER — Telehealth: Payer: Self-pay

## 2021-03-23 ENCOUNTER — Inpatient Hospital Stay: Payer: BC Managed Care – PPO | Admitting: Hematology and Oncology

## 2021-03-23 NOTE — Progress Notes (Incomplete)
[place] FOLLOW UP NOTE  Patient Care Team: Glenda Chroman, MD as PCP - General (Internal Medicine) [name], MD as PCP - Cardiology (Cardiology) Benay Pike, MD as Consulting Physician (Hematology and Oncology)  CHIEF COMPLAINTS/PURPOSE OF CONSULTATION:  Metastatic RCC, follow up.  ASSESSMENT & PLAN:  No problem-specific Assessment & Plan notes found for this encounter.  No orders of the defined types were placed in this encounter.    HISTORY OF PRESENTING ILLNESS:  [name] 55 y.o. male is here because of metastatic [name]  Oncology History Overview Note  [name] is a 55 year old male with a past medical history significant for colon cancer (status post hemicolectomy in 2015 -previously followed by Dr. Jacquiline Doe at [facility]), hyperlipidemia, hypertension.  The patient was transferred from [facility] emergency department to [facility] due to acute hypoxic respiratory failure.  The patient has a 3-week history of cough which progressively worsened.  He subsequently developed shortness of breath. He has a recent 15-20 pound unintentional weight loss in the past few weeks and decreased oral intake due to lack of appetite and generalized weakness.  CT chest with contrast was performed on 01/22/2021 at [facility] [place] and reviewed through care everywhere.  This showed a large solid heterogeneous mass arising off the upper pole of the left kidney consistent with primary renal cell carcinoma, tumor extension into the left renal vein identified, left retroperitoneal metastatic adenopathy, extensive nodal metastasis within the mediastinum and bilateral hilar regions, bilateral pleural metastasis with large malignant right pleural effusion, multiple pulmonary nodules identified compatible with pulmonary metastasis, small to moderate pericardial effusion.  The patient had a CT abdomen/pelvis performed 01/23/2021 which showed a 15.1 cm left upper pole renal  mass compatible with advanced renal cell carcinoma, expansile tumor thrombus the left renal vein, so see the left para-aortic nodal metastases, small hepatic metastases, bilateral pulmonary metastases, extensive pleural metastases in the right hemithorax with moderate to large pleural effusion, thoracic nodal metastases.  The patient had ultrasound-guided thoracentesis performed 01/23/2021 with 2.5 L of fluid removed and a repeat thoracentesis performed earlier today with 2 L of fluid removed. Fluid from thoracentesis negative for malignant cells.  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    Metastatic renal cell carcinoma ([place])  01/23/2021 Initial Diagnosis   Metastatic renal cell carcinoma ([facility])     INTERIM HISTORY    MEDICAL HISTORY:  Past Medical History:  Diagnosis Date  . Anemia   . Cancer [facility])    colon cancer  . Colon cancer ([place])   . Dyspnea   . GIB (gastrointestinal bleeding)   . High cholesterol   . History of colon cancer 01/23/2021  . HTN (hypertension)     SURGICAL HISTORY: Past Surgical History:  Procedure Laterality Date  . CHEST TUBE INSERTION Right 02/15/2021   Procedure: INSERTION PLEURAL DRAINAGE CATHETER;  Surgeon: Melrose Nakayama, MD;  Location: [place];  Service: Thoracic;  Laterality: Right;  . COLON RESECTION    . HEMICOLECTOMY Right   . HERNIA REPAIR    . IR THORACENTESIS ASP PLEURAL SPACE W/IMG GUIDE  01/25/2021    SOCIAL HISTORY: Social History   Socioeconomic History  . Marital status: Single    Spouse name: Not on file  . Number of children: Not on file  . Years of education: Not on file  . Highest education level: Not  on file  Occupational History  . Not on file  Tobacco Use  . Smoking status: Former Smoker    Quit date: 2006    Years since quitting: 16.4  . Smokeless tobacco: Never Used  Vaping Use   . Vaping Use: Never used  Substance and Sexual Activity  . Alcohol use: No  . Drug use: No  . Sexual activity: Not Currently  Other Topics Concern  . Not on file  Social History Narrative  . Not on file   Social Determinants of Health   Financial Resource Strain: Not on file  Food Insecurity: Not on file  Transportation Needs: Not on file  Physical Activity: Not on file  Stress: Not on file  Social Connections: Not on file  Intimate Partner Violence: Not on file    FAMILY HISTORY: Family History  Problem Relation Age of Onset  . Hypertension Mother   . Hypertension Sister   . Hypertension Brother   . Heart attack Cousin   . Cancer Father     ALLERGIES:  has No Known Allergies.  MEDICATIONS:  Current Outpatient Medications  Medication Sig Dispense Refill  . albuterol (VENTOLIN HFA) 108 (90 Base) MCG/ACT inhaler Inhale 2 puffs into the lungs every 4 (four) hours as needed for shortness of breath or wheezing.    . cabozantinib (CABOMETYX) 40 MG tablet Take 1 tablet (40 mg total) by mouth daily. Take on an empty stomach, 1 hour before or 2 hours after meals. 30 tablet 1  . furosemide (LASIX) 40 MG tablet Take by mouth.    . meclizine (ANTIVERT) 12.5 MG tablet TAKE 1 TABLET (12.5 MG TOTAL) BY MOUTH 3 (THREE) TIMES DAILY AS NEEDED FOR DIZZINESS. 90 tablet 3  . meloxicam (MOBIC) 7.5 MG tablet TAKE 1 TABLET BY MOUTH EVERY DAY AS NEEDED FOR PAIN 30 tablet 1  . ondansetron (ZOFRAN) 8 MG tablet Take 1 tablet (8 mg total) by mouth every 8 (eight) hours as needed for nausea. 30 tablet 3  . oxyCODONE (OXY IR/ROXICODONE) 5 MG immediate release tablet Take 1 tablet (5 mg total) by mouth every 6 (six) hours as needed (postop pain). 28 tablet 0  . OXYGEN Inhale 2 L into the lungs.    . pantoprazole (PROTONIX) 40 MG tablet Take 40 mg by mouth daily.    . potassium chloride (KLOR-CON) 10 MEQ tablet Take 10 mEq by mouth daily as needed.     No current facility-administered medications for  this visit.      PHYSICAL EXAMINATION: ECOG PERFORMANCE STATUS: 2 - Symptomatic, <50% confined to bed  Borderline 3  There were no vitals filed for this visit. There were no vitals filed for this visit.  GENERAL:alert, frail in moderate respiratory distress in wheelchair NECK: supple, thyroid normal size, non-tender, without nodularity LYMPH:  no palpable lymphadenopathy in the cervical, axillary LUNGS: No or minimal air entry right base  HEART: Sinus tachycardia, bilateral severe lower extremity pitting edema 2+ ABDOMEN: Abdomen appears distended, limited exam given his sitting position in wheelchair  PSYCH: alert & oriented x 3 with fluent speech NEURO: no focal motor/sensory deficits  LABORATORY DATA:  I have reviewed the data as listed Lab Results  Component Value Date   WBC 10.8 (H) 03/16/2021   HGB 8.8 (L) 03/16/2021   HCT 28.0 (L) 03/16/2021   MCV 78.9 (L) 03/16/2021   PLT 676 (H) 03/16/2021     Chemistry      Component Value Date/Time   NA 137 03/16/2021  0950   K 6.0 (H) 03/16/2021 0950   CL 98 03/16/2021 0950   CO2 25 03/16/2021 0950   BUN 34 (H) 03/16/2021 0950   CREATININE 1.74 (H) 03/16/2021 0950      Component Value Date/Time   CALCIUM 9.8 03/16/2021 0950   ALKPHOS 93 03/16/2021 0950   AST 27 03/16/2021 0950   ALT 26 03/16/2021 0950   BILITOT 0.6 03/16/2021 0950       RADIOGRAPHIC STUDIES: I have personally reviewed the radiological images as listed and agreed with the findings in the report. DG Chest 2 View  Result Date: 03/09/2021 CLINICAL DATA:  PleurX catheter placement EXAM: CHEST - 2 VIEW COMPARISON:  February 12, 2021 FINDINGS: PleurX catheter placed on the right. No pneumothorax. Large mass right upper lobe remains. Suspected loculated pleural effusion with areas of consolidation throughout the right lung appear similar to recent study. On the left, there is a small pleural effusion. There is ill-defined opacity in the left upper lobe, likely  representing pneumonia. Slightly increased opacity in this area compared to 1 month prior. Heart is prominent, stable, with pulmonary vascularity normal. Suspected hilar adenopathy bilaterally. Soft tissue fullness left paratracheal region, likely due to adenopathy. No bone lesions IMPRESSION: PleurX catheter placed on the right without pneumothorax. Mass lesions and areas of suspected adenopathy consistent with known underlying neoplasm. Opacity throughout much of the right lung consistent with combination of areas of pleural effusion in airspace consolidation. Increased opacity left upper lobe, likely pneumonia, although neoplasm underlying is possible in this area. Small left pleural effusion. Stable cardiac silhouette. Electronically Signed   By: Lowella Grip III M.D.   On: 03/09/2021 12:47   CT CHEST WO CONTRAST  Result Date: 03/12/2021 CLINICAL DATA:  Abnormal fall chest x-ray with pleural fluid and masslike areas in the chest. EXAM: CT CHEST WITHOUT CONTRAST TECHNIQUE: Multidetector CT imaging of the chest was performed following the standard protocol without IV contrast. COMPARISON:  Chest x-ray of Mar 09, 2021 and prior imaging of the abdomen and pelvis from March of 2022 as well as biopsy images from April of 2022. FINDINGS: Cardiovascular: Shift of heart mediastinal structures from RIGHT to LEFT, see below. Pericardial effusion with similar volume. Pericardial nodularity in adjacent adenopathy increased. Mediastinum/Nodes: Thoracic inlet structures are normal. No axillary lymphadenopathy. Bulky subcarinal adenopathy measuring as much as 4.0 x 6.1 cm, previously 3.2 cm short axis. AP window adenopathy increased (image 65/2) this could be a mixture of AP window lymph nodes and adjacent pleural disease but measuring approximately 2.5 cm greatest thickness as compared to 2 cm on the previous abdominal CT. Lungs/Pleura: Heart is shifted into the LEFT chest due to worsening pleural disease and  enlarging effusion in the RIGHT chest. Loculated appearance of fluid and mixed density in the RIGHT upper chest corresponds to the chest x-ray abnormality of convex protrusions along the upper margin of the PleurX catheter. The PleurX catheter is seen in the posterior RIGHT chest. This is situated more in the anterior RIGHT chest in fills the RIGHT lung apex. Pleural thickening in the RIGHT lower chest is markedly increased compared to previous imaging of just 2 months ago, now circumferential throughout the RIGHT chest. In the anterior chest in the cardiophrenic recess there is near confluent disease that tracks along the pleural space peripherally and involves collapsed lung in pleural surfaces including the major fissure tracking into the RIGHT lung apex. This measures in total approximately 8.3 x 8.6 cm greatest axial dimension previously individual  nodules in this area while not insignificant ranged in size from 1 cm to 2 cm and in total while not confluent on the prior study measuring approximately 5.4 x 3.4 cm in axial dimension. Shift of mediastinum may be slightly increased but is present on the prior study as well. In addition to circumferential soft tissue with increase in the RIGHT chest there is increasing pleural fluid in the LEFT chest with signs of pleural involvement (image 28/2) 1.8 cm pleural base nodule. (Image 61/2 1.8 x 1.1 cm pleural base nodule. Similar size nodule in the anterolateral LEFT upper chest. Diminished aeration of lung in the RIGHT chest compared to previous imaging. Septal thickening and ground-glass in the RIGHT middle lobe. Minimal aeration of the RIGHT lower lobe. Volume loss also in the RIGHT upper lobe due to worsening disease Pleural nodularity the extends along the fissure in the LEFT chest as well. Major airways are patent with narrowing of peripheral airways in the RIGHT chest. Upper Abdomen: Large LEFT renal mass not well evaluated better seen on recent CT. Bulky  para-aortic adenopathy and renal venous extension also better evaluated on recent CT. Expansion of the LEFT renal vein may be increased is a crosses the midline, difficult to assess. Increasing size of hepatic metastatic lesions and likely with new hepatic metastatic disease. On image 144 of series 2 measuring 5 x 2.3 cm this previously measured approximately 2.9 x 2.0 cm. New lesions are suspected with numerous low-attenuation foci now seen throughout the liver. For instance on image 178 of series 2 is a 16 mm lesion not seen on the previous study. Musculoskeletal: No acute musculoskeletal process. No destructive bone finding IMPRESSION: 1. Marked interval worsening of pleural disease particularly in the RIGHT chest, likely associated with small amounts of hemorrhage scattered throughout a loculated area in the superior aspect of the RIGHT chest accounting for the convex abnormality seen on chest x-ray above the PleurX catheter. 2. PleurX catheter terminates in the upper to mid chest below this area likely tracking along the major fissure though difficult to determine given the extent of pleural disease. 3. Associated volume loss in the RIGHT chest as described. Area of either lymphangitic tumor spread in the RIGHT middle lobe versus postobstructive pneumonitis. 4. Signs of new and worsening hepatic metastatic disease. 5. Similar volume of pericardial effusion. These results will be called to the ordering clinician or representative by the Radiologist Assistant, and communication documented in the PACS or [employer]. Electronically Signed   By: Zetta Bills M.D.   On: 03/12/2021 14:18   MR BRAIN W WO CONTRAST  Result Date: 02/28/2021 CLINICAL DATA:  Staging for renal cell carcinoma. EXAM: MRI HEAD WITHOUT AND WITH CONTRAST TECHNIQUE: Multiplanar, multiecho pulse sequences of the brain and surrounding structures were obtained without and with intravenous contrast. CONTRAST:  52mL GADAVIST GADOBUTROL 1  MMOL/ML IV SOLN COMPARISON:  None. FINDINGS: Brain: Diffusion imaging does not show any acute or subacute infarction or other cause of restricted diffusion. No focal abnormality affects the brainstem or cerebellum. Cerebral hemispheres are normal. No stroke, mass, hemorrhage, hydrocephalus or extra-axial collection. After contrast administration, no abnormal enhancement occurs. Vascular: Major vessels at the base of the brain show flow. Skull and upper cervical spine: Negative Sinuses/Orbits: Opacification of the right division of the sphenoid sinus. Other sinuses clear. Orbits negative. Other: None IMPRESSION: No evidence of metastatic disease.  Normal appearance of the brain. Fluid opacification of the right division of the sphenoid sinus. Electronically Signed  By: Nelson Chimes M.D.   On: 02/28/2021 20:38   His recent CT chest imaging which showed marked interval worsening of pleural disease, concern for lymphangitic tumor spread, worsening hepatic metastatic disease and similar volume of pericardial effusion.  All questions were answered. The patient knows to call the clinic with any problems, questions or concerns.  I have spent over 40 minutes in the care of this patient including review of records, goals of care discussion, coordination with our team, reviewing labs.    Benay Pike, MD 03/23/2021 1:17 PM

## 2021-03-23 NOTE — Telephone Encounter (Signed)
Attempted to call patient regarding appt today, no answer. Scheduling message sent to get patient rescheduled.

## 2021-03-24 ENCOUNTER — Telehealth: Payer: Self-pay | Admitting: Hematology and Oncology

## 2021-03-24 ENCOUNTER — Other Ambulatory Visit: Payer: Self-pay | Admitting: Thoracic Surgery (Cardiothoracic Vascular Surgery)

## 2021-03-24 DIAGNOSIS — J90 Pleural effusion, not elsewhere classified: Secondary | ICD-10-CM

## 2021-03-24 NOTE — Telephone Encounter (Signed)
R/s appts per 5/24 sch msg. Called pt, no answer. Left msg with appts date and times.

## 2021-03-27 ENCOUNTER — Inpatient Hospital Stay
Admission: AD | Admit: 2021-03-27 | Payer: BC Managed Care – PPO | Source: Other Acute Inpatient Hospital | Admitting: Family Medicine

## 2021-03-30 ENCOUNTER — Inpatient Hospital Stay: Payer: BC Managed Care – PPO

## 2021-03-30 ENCOUNTER — Inpatient Hospital Stay: Payer: BC Managed Care – PPO | Admitting: Hematology and Oncology

## 2021-03-30 ENCOUNTER — Ambulatory Visit: Payer: BC Managed Care – PPO | Admitting: Thoracic Surgery (Cardiothoracic Vascular Surgery)

## 2021-03-30 ENCOUNTER — Telehealth: Payer: Self-pay

## 2021-03-30 NOTE — Progress Notes (Deleted)
[facility] FOLLOW UP NOTE  Patient Care Team: Thomas Chroman, MD as PCP - General (Internal Medicine) Thomas Bowie Alphonse Guild, MD as PCP - Cardiology (Cardiology) Thomas Pike, MD as Consulting Physician (Hematology and Oncology)  CHIEF COMPLAINTS/PURPOSE OF CONSULTATION:  Metastatic RCC, follow up.  ASSESSMENT & PLAN:  No problem-specific Assessment & Plan notes found for this encounter.  No orders of the defined types were placed in this encounter.    HISTORY OF PRESENTING ILLNESS:  [name] 55 y.o. male is here because of metastatic [place]  Oncology History Overview Note  [name] is a 55 year old male with a past medical history significant for colon cancer (status post hemicolectomy in 2015 -previously followed by [name] at [facility]), hyperlipidemia, hypertension.  The patient was transferred from [facility] emergency department to [facility] due to acute hypoxic respiratory failure.  The patient has a 3-week history of cough which progressively worsened.  He subsequently developed shortness of breath. He has a recent 15-20 pound unintentional weight loss in the past few weeks and decreased oral intake due to lack of appetite and generalized weakness.  CT chest with contrast was performed on 01/22/2021 at [facility] and reviewed through care everywhere.  This showed a large solid heterogeneous mass arising off the upper pole of the left kidney consistent with primary renal cell carcinoma, tumor extension into the left renal vein identified, left retroperitoneal metastatic adenopathy, extensive nodal metastasis within the mediastinum and bilateral hilar regions, bilateral pleural metastasis with large malignant right pleural effusion, multiple pulmonary nodules identified compatible with pulmonary metastasis, small to moderate pericardial effusion.  The patient had a CT abdomen/pelvis performed 01/23/2021 which showed a 15.1 cm left upper pole renal  mass compatible with advanced renal cell carcinoma, expansile tumor thrombus the left renal vein, so see the left para-aortic nodal metastases, small hepatic metastases, bilateral pulmonary metastases, extensive pleural metastases in the right hemithorax with moderate to large pleural effusion, thoracic nodal metastases.  The patient had ultrasound-guided thoracentesis performed 01/23/2021 with 2.5 L of fluid removed and a repeat thoracentesis performed earlier today with 2 L of fluid removed. Fluid from thoracentesis negative for malignant cells.  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    Metastatic renal cell carcinoma ([place])  01/23/2021 Initial Diagnosis   Metastatic renal cell carcinoma (H[facility])     INTERIM HISTORY    MEDICAL HISTORY:  Past Medical History:  Diagnosis Date  . Anemia   . Cancer [facility])    colon cancer  . Colon cancer ([place])   . Dyspnea   . GIB (gastrointestinal bleeding)   . High cholesterol   . History of colon cancer 01/23/2021  . HTN (hypertension)     SURGICAL HISTORY: Past Surgical History:  Procedure Laterality Date  . CHEST TUBE INSERTION Right 02/15/2021   Procedure: INSERTION PLEURAL DRAINAGE CATHETER;  Surgeon: Thomas Nakayama, MD;  Location: [place];  Service: Thoracic;  Laterality: Right;  . COLON RESECTION    . HEMICOLECTOMY Right   . HERNIA REPAIR    . IR THORACENTESIS ASP PLEURAL SPACE W/IMG GUIDE  01/25/2021    SOCIAL HISTORY: Social History   Socioeconomic History  . Marital status: Single    Spouse name: Not on file  . Number of children: Not on file  . Years of education: Not on file  . Highest education level: Not  on file  Occupational History  . Not on file  Tobacco Use  . Smoking status: Former Smoker    Quit date: 2006    Years since quitting: 16.4  . Smokeless tobacco: Never Used  Vaping Use   . Vaping Use: Never used  Substance and Sexual Activity  . Alcohol use: No  . Drug use: No  . Sexual activity: Not Currently  Other Topics Concern  . Not on file  Social History Narrative  . Not on file   Social Determinants of Health   Financial Resource Strain: Not on file  Food Insecurity: Not on file  Transportation Needs: Not on file  Physical Activity: Not on file  Stress: Not on file  Social Connections: Not on file  Intimate Partner Violence: Not on file    FAMILY HISTORY: Family History  Problem Relation Age of Onset  . Hypertension Mother   . Hypertension Sister   . Hypertension Brother   . Heart attack Cousin   . Cancer Father     ALLERGIES:  has No Known Allergies.  MEDICATIONS:  Current Outpatient Medications  Medication Sig Dispense Refill  . albuterol (VENTOLIN HFA) 108 (90 Base) MCG/ACT inhaler Inhale 2 puffs into the lungs every 4 (four) hours as needed for shortness of breath or wheezing.    . cabozantinib (CABOMETYX) 40 MG tablet Take 1 tablet (40 mg total) by mouth daily. Take on an empty stomach, 1 hour before or 2 hours after meals. 30 tablet 1  . furosemide (LASIX) 40 MG tablet Take by mouth.    . meclizine (ANTIVERT) 12.5 MG tablet TAKE 1 TABLET (12.5 MG TOTAL) BY MOUTH 3 (THREE) TIMES DAILY AS NEEDED FOR DIZZINESS. 90 tablet 3  . meloxicam (MOBIC) 7.5 MG tablet TAKE 1 TABLET BY MOUTH EVERY DAY AS NEEDED FOR PAIN 30 tablet 1  . ondansetron (ZOFRAN) 8 MG tablet Take 1 tablet (8 mg total) by mouth every 8 (eight) hours as needed for nausea. 30 tablet 3  . oxyCODONE (OXY IR/ROXICODONE) 5 MG immediate release tablet Take 1 tablet (5 mg total) by mouth every 6 (six) hours as needed (postop pain). 28 tablet 0  . OXYGEN Inhale 2 L into the lungs.    . pantoprazole (PROTONIX) 40 MG tablet Take 40 mg by mouth daily.    . potassium chloride (KLOR-CON) 10 MEQ tablet Take 10 mEq by mouth daily as needed.     No current facility-administered medications for  this visit.      PHYSICAL EXAMINATION: ECOG PERFORMANCE STATUS: 2 - Symptomatic, <50% confined to bed  Borderline 3  There were no vitals filed for this visit. There were no vitals filed for this visit.  GENERAL:alert, frail in moderate respiratory distress in wheelchair NECK: supple, thyroid normal size, non-tender, without nodularity LYMPH:  no palpable lymphadenopathy in the cervical, axillary LUNGS: No or minimal air entry right base  HEART: Sinus tachycardia, bilateral severe lower extremity pitting edema 2+ ABDOMEN: Abdomen appears distended, limited exam given his sitting position in wheelchair  PSYCH: alert & oriented x 3 with fluent speech NEURO: no focal motor/sensory deficits  LABORATORY DATA:  I have reviewed the data as listed Lab Results  Component Value Date   WBC 10.8 (H) 03/16/2021   HGB 8.8 (L) 03/16/2021   HCT 28.0 (L) 03/16/2021   MCV 78.9 (L) 03/16/2021   PLT 676 (H) 03/16/2021     Chemistry      Component Value Date/Time   NA 137 03/16/2021  0950   K 6.0 (H) 03/16/2021 0950   CL 98 03/16/2021 0950   CO2 25 03/16/2021 0950   BUN 34 (H) 03/16/2021 0950   CREATININE 1.74 (H) 03/16/2021 0950      Component Value Date/Time   CALCIUM 9.8 03/16/2021 0950   ALKPHOS 93 03/16/2021 0950   AST 27 03/16/2021 0950   ALT 26 03/16/2021 0950   BILITOT 0.6 03/16/2021 0950       RADIOGRAPHIC STUDIES: I have personally reviewed the radiological images as listed and agreed with the findings in the report. DG Chest 2 View  Result Date: 03/09/2021 CLINICAL DATA:  PleurX catheter placement EXAM: CHEST - 2 VIEW COMPARISON:  February 12, 2021 FINDINGS: PleurX catheter placed on the right. No pneumothorax. Large mass right upper lobe remains. Suspected loculated pleural effusion with areas of consolidation throughout the right lung appear similar to recent study. On the left, there is a small pleural effusion. There is ill-defined opacity in the left upper lobe, likely  representing pneumonia. Slightly increased opacity in this area compared to 1 month prior. Heart is prominent, stable, with pulmonary vascularity normal. Suspected hilar adenopathy bilaterally. Soft tissue fullness left paratracheal region, likely due to adenopathy. No bone lesions IMPRESSION: PleurX catheter placed on the right without pneumothorax. Mass lesions and areas of suspected adenopathy consistent with known underlying neoplasm. Opacity throughout much of the right lung consistent with combination of areas of pleural effusion in airspace consolidation. Increased opacity left upper lobe, likely pneumonia, although neoplasm underlying is possible in this area. Small left pleural effusion. Stable cardiac silhouette. Electronically Signed   By: Lowella Grip III M.D.   On: 03/09/2021 12:47   CT CHEST WO CONTRAST  Result Date: 03/12/2021 CLINICAL DATA:  Abnormal fall chest x-ray with pleural fluid and masslike areas in the chest. EXAM: CT CHEST WITHOUT CONTRAST TECHNIQUE: Multidetector CT imaging of the chest was performed following the standard protocol without IV contrast. COMPARISON:  Chest x-ray of Mar 09, 2021 and prior imaging of the abdomen and pelvis from March of 2022 as well as biopsy images from April of 2022. FINDINGS: Cardiovascular: Shift of heart mediastinal structures from RIGHT to LEFT, see below. Pericardial effusion with similar volume. Pericardial nodularity in adjacent adenopathy increased. Mediastinum/Nodes: Thoracic inlet structures are normal. No axillary lymphadenopathy. Bulky subcarinal adenopathy measuring as much as 4.0 x 6.1 cm, previously 3.2 cm short axis. AP window adenopathy increased (image 65/2) this could be a mixture of AP window lymph nodes and adjacent pleural disease but measuring approximately 2.5 cm greatest thickness as compared to 2 cm on the previous abdominal CT. Lungs/Pleura: Heart is shifted into the LEFT chest due to worsening pleural disease and  enlarging effusion in the RIGHT chest. Loculated appearance of fluid and mixed density in the RIGHT upper chest corresponds to the chest x-ray abnormality of convex protrusions along the upper margin of the PleurX catheter. The PleurX catheter is seen in the posterior RIGHT chest. This is situated more in the anterior RIGHT chest in fills the RIGHT lung apex. Pleural thickening in the RIGHT lower chest is markedly increased compared to previous imaging of just 2 months ago, now circumferential throughout the RIGHT chest. In the anterior chest in the cardiophrenic recess there is near confluent disease that tracks along the pleural space peripherally and involves collapsed lung in pleural surfaces including the major fissure tracking into the RIGHT lung apex. This measures in total approximately 8.3 x 8.6 cm greatest axial dimension previously individual  nodules in this area while not insignificant ranged in size from 1 cm to 2 cm and in total while not confluent on the prior study measuring approximately 5.4 x 3.4 cm in axial dimension. Shift of mediastinum may be slightly increased but is present on the prior study as well. In addition to circumferential soft tissue with increase in the RIGHT chest there is increasing pleural fluid in the LEFT chest with signs of pleural involvement (image 28/2) 1.8 cm pleural base nodule. (Image 61/2 1.8 x 1.1 cm pleural base nodule. Similar size nodule in the anterolateral LEFT upper chest. Diminished aeration of lung in the RIGHT chest compared to previous imaging. Septal thickening and ground-glass in the RIGHT middle lobe. Minimal aeration of the RIGHT lower lobe. Volume loss also in the RIGHT upper lobe due to worsening disease Pleural nodularity the extends along the fissure in the LEFT chest as well. Major airways are patent with narrowing of peripheral airways in the RIGHT chest. Upper Abdomen: Large LEFT renal mass not well evaluated better seen on recent CT. Bulky  para-aortic adenopathy and renal venous extension also better evaluated on recent CT. Expansion of the LEFT renal vein may be increased is a crosses the midline, difficult to assess. Increasing size of hepatic metastatic lesions and likely with new hepatic metastatic disease. On image 144 of series 2 measuring 5 x 2.3 cm this previously measured approximately 2.9 x 2.0 cm. New lesions are suspected with numerous low-attenuation foci now seen throughout the liver. For instance on image 178 of series 2 is a 16 mm lesion not seen on the previous study. Musculoskeletal: No acute musculoskeletal process. No destructive bone finding IMPRESSION: 1. Marked interval worsening of pleural disease particularly in the RIGHT chest, likely associated with small amounts of hemorrhage scattered throughout a loculated area in the superior aspect of the RIGHT chest accounting for the convex abnormality seen on chest x-ray above the PleurX catheter. 2. PleurX catheter terminates in the upper to mid chest below this area likely tracking along the major fissure though difficult to determine given the extent of pleural disease. 3. Associated volume loss in the RIGHT chest as described. Area of either lymphangitic tumor spread in the RIGHT middle lobe versus postobstructive pneumonitis. 4. Signs of new and worsening hepatic metastatic disease. 5. Similar volume of pericardial effusion. These results will be called to the ordering clinician or representative by the Radiologist Assistant, and communication documented in the PACS or [employer]. Electronically Signed   By: Zetta Bills M.D.   On: 03/12/2021 14:18   His recent CT chest imaging which showed marked interval worsening of pleural disease, concern for lymphangitic tumor spread, worsening hepatic metastatic disease and similar volume of pericardial effusion.  All questions were answered. The patient knows to call the clinic with any problems, questions or concerns.  I  have spent over 40 minutes in the care of this patient including review of records, goals of care discussion, coordination with our team, reviewing labs.    Thomas Pike, MD 03/30/2021 9:55 AM

## 2021-03-30 NOTE — Telephone Encounter (Signed)
Attempted to call patient regarding his missed appointment on 5/31, no answer. Scheduling message sent to reschedule.

## 2021-04-02 ENCOUNTER — Telehealth: Payer: Self-pay

## 2021-04-02 NOTE — Telephone Encounter (Signed)
Nutrition  Called patient for nutrition follow-up.  No answer. Left message with call back number  Brittain Hosie B. Antoin Dargis, RD, LDN Registered Dietitian [phone] (mobile)  

## 2021-04-05 ENCOUNTER — Inpatient Hospital Stay (HOSPITAL_COMMUNITY)
Admission: EM | Admit: 2021-04-05 | Discharge: 2021-04-30 | DRG: 686 | Disposition: E | Payer: BC Managed Care – PPO | Attending: Internal Medicine | Admitting: Internal Medicine

## 2021-04-05 DIAGNOSIS — J910 Malignant pleural effusion: Secondary | ICD-10-CM | POA: Diagnosis present

## 2021-04-05 DIAGNOSIS — J90 Pleural effusion, not elsewhere classified: Secondary | ICD-10-CM

## 2021-04-05 DIAGNOSIS — C649 Malignant neoplasm of unspecified kidney, except renal pelvis: Secondary | ICD-10-CM | POA: Diagnosis not present

## 2021-04-05 DIAGNOSIS — E7800 Pure hypercholesterolemia, unspecified: Secondary | ICD-10-CM | POA: Diagnosis present

## 2021-04-05 DIAGNOSIS — R0602 Shortness of breath: Secondary | ICD-10-CM | POA: Diagnosis not present

## 2021-04-05 DIAGNOSIS — Z87891 Personal history of nicotine dependence: Secondary | ICD-10-CM

## 2021-04-05 DIAGNOSIS — C782 Secondary malignant neoplasm of pleura: Secondary | ICD-10-CM | POA: Diagnosis present

## 2021-04-05 DIAGNOSIS — R64 Cachexia: Secondary | ICD-10-CM | POA: Diagnosis present

## 2021-04-05 DIAGNOSIS — Z66 Do not resuscitate: Secondary | ICD-10-CM | POA: Diagnosis present

## 2021-04-05 DIAGNOSIS — Z8249 Family history of ischemic heart disease and other diseases of the circulatory system: Secondary | ICD-10-CM

## 2021-04-05 DIAGNOSIS — J9621 Acute and chronic respiratory failure with hypoxia: Secondary | ICD-10-CM | POA: Diagnosis present

## 2021-04-05 DIAGNOSIS — D72828 Other elevated white blood cell count: Secondary | ICD-10-CM | POA: Diagnosis present

## 2021-04-05 DIAGNOSIS — R0902 Hypoxemia: Secondary | ICD-10-CM

## 2021-04-05 DIAGNOSIS — N1831 Chronic kidney disease, stage 3a: Secondary | ICD-10-CM | POA: Diagnosis present

## 2021-04-05 DIAGNOSIS — Z515 Encounter for palliative care: Secondary | ICD-10-CM

## 2021-04-05 DIAGNOSIS — Z20822 Contact with and (suspected) exposure to covid-19: Secondary | ICD-10-CM | POA: Diagnosis present

## 2021-04-05 DIAGNOSIS — Z9981 Dependence on supplemental oxygen: Secondary | ICD-10-CM

## 2021-04-05 DIAGNOSIS — Z79899 Other long term (current) drug therapy: Secondary | ICD-10-CM

## 2021-04-05 DIAGNOSIS — E875 Hyperkalemia: Secondary | ICD-10-CM | POA: Diagnosis present

## 2021-04-05 DIAGNOSIS — Z809 Family history of malignant neoplasm, unspecified: Secondary | ICD-10-CM

## 2021-04-05 DIAGNOSIS — C787 Secondary malignant neoplasm of liver and intrahepatic bile duct: Secondary | ICD-10-CM | POA: Diagnosis present

## 2021-04-05 DIAGNOSIS — I129 Hypertensive chronic kidney disease with stage 1 through stage 4 chronic kidney disease, or unspecified chronic kidney disease: Secondary | ICD-10-CM | POA: Diagnosis present

## 2021-04-05 DIAGNOSIS — C189 Malignant neoplasm of colon, unspecified: Secondary | ICD-10-CM | POA: Diagnosis present

## 2021-04-06 ENCOUNTER — Inpatient Hospital Stay: Payer: BC Managed Care – PPO

## 2021-04-06 ENCOUNTER — Emergency Department (HOSPITAL_COMMUNITY): Payer: BC Managed Care – PPO

## 2021-04-06 ENCOUNTER — Encounter (HOSPITAL_COMMUNITY): Payer: Self-pay

## 2021-04-06 ENCOUNTER — Other Ambulatory Visit: Payer: Self-pay

## 2021-04-06 ENCOUNTER — Inpatient Hospital Stay: Payer: BC Managed Care – PPO | Admitting: Hematology and Oncology

## 2021-04-06 DIAGNOSIS — C649 Malignant neoplasm of unspecified kidney, except renal pelvis: Principal | ICD-10-CM

## 2021-04-06 DIAGNOSIS — I129 Hypertensive chronic kidney disease with stage 1 through stage 4 chronic kidney disease, or unspecified chronic kidney disease: Secondary | ICD-10-CM | POA: Diagnosis present

## 2021-04-06 DIAGNOSIS — C787 Secondary malignant neoplasm of liver and intrahepatic bile duct: Secondary | ICD-10-CM | POA: Diagnosis present

## 2021-04-06 DIAGNOSIS — C782 Secondary malignant neoplasm of pleura: Secondary | ICD-10-CM | POA: Diagnosis present

## 2021-04-06 DIAGNOSIS — E7800 Pure hypercholesterolemia, unspecified: Secondary | ICD-10-CM | POA: Diagnosis present

## 2021-04-06 DIAGNOSIS — J90 Pleural effusion, not elsewhere classified: Secondary | ICD-10-CM | POA: Diagnosis not present

## 2021-04-06 DIAGNOSIS — Z7189 Other specified counseling: Secondary | ICD-10-CM | POA: Diagnosis not present

## 2021-04-06 DIAGNOSIS — Z809 Family history of malignant neoplasm, unspecified: Secondary | ICD-10-CM | POA: Diagnosis not present

## 2021-04-06 DIAGNOSIS — Z515 Encounter for palliative care: Secondary | ICD-10-CM | POA: Diagnosis not present

## 2021-04-06 DIAGNOSIS — Z87891 Personal history of nicotine dependence: Secondary | ICD-10-CM | POA: Diagnosis not present

## 2021-04-06 DIAGNOSIS — R9431 Abnormal electrocardiogram [ECG] [EKG]: Secondary | ICD-10-CM | POA: Diagnosis not present

## 2021-04-06 DIAGNOSIS — D72828 Other elevated white blood cell count: Secondary | ICD-10-CM | POA: Diagnosis present

## 2021-04-06 DIAGNOSIS — N1831 Chronic kidney disease, stage 3a: Secondary | ICD-10-CM

## 2021-04-06 DIAGNOSIS — E875 Hyperkalemia: Secondary | ICD-10-CM | POA: Diagnosis present

## 2021-04-06 DIAGNOSIS — Z20822 Contact with and (suspected) exposure to covid-19: Secondary | ICD-10-CM | POA: Diagnosis present

## 2021-04-06 DIAGNOSIS — J910 Malignant pleural effusion: Secondary | ICD-10-CM

## 2021-04-06 DIAGNOSIS — J9621 Acute and chronic respiratory failure with hypoxia: Secondary | ICD-10-CM | POA: Diagnosis present

## 2021-04-06 DIAGNOSIS — Z79899 Other long term (current) drug therapy: Secondary | ICD-10-CM | POA: Diagnosis not present

## 2021-04-06 DIAGNOSIS — R64 Cachexia: Secondary | ICD-10-CM | POA: Diagnosis present

## 2021-04-06 DIAGNOSIS — Z8249 Family history of ischemic heart disease and other diseases of the circulatory system: Secondary | ICD-10-CM | POA: Diagnosis not present

## 2021-04-06 DIAGNOSIS — C189 Malignant neoplasm of colon, unspecified: Secondary | ICD-10-CM | POA: Diagnosis present

## 2021-04-06 DIAGNOSIS — R0602 Shortness of breath: Secondary | ICD-10-CM | POA: Diagnosis present

## 2021-04-06 DIAGNOSIS — Z66 Do not resuscitate: Secondary | ICD-10-CM | POA: Diagnosis present

## 2021-04-06 DIAGNOSIS — Z9981 Dependence on supplemental oxygen: Secondary | ICD-10-CM | POA: Diagnosis not present

## 2021-04-06 LAB — CBC WITH DIFFERENTIAL/PLATELET
Abs Immature Granulocytes: 0.04 10*3/uL (ref 0.00–0.07)
Basophils Absolute: 0 10*3/uL (ref 0.0–0.1)
Basophils Relative: 0 %
Eosinophils Absolute: 0 10*3/uL (ref 0.0–0.5)
Eosinophils Relative: 0 %
HCT: 35 % — ABNORMAL LOW (ref 39.0–52.0)
Hemoglobin: 10.2 g/dL — ABNORMAL LOW (ref 13.0–17.0)
Immature Granulocytes: 0 %
Lymphocytes Relative: 5 %
Lymphs Abs: 0.6 10*3/uL — ABNORMAL LOW (ref 0.7–4.0)
MCH: 23.8 pg — ABNORMAL LOW (ref 26.0–34.0)
MCHC: 29.1 g/dL — ABNORMAL LOW (ref 30.0–36.0)
MCV: 81.8 fL (ref 80.0–100.0)
Monocytes Absolute: 0.7 10*3/uL (ref 0.1–1.0)
Monocytes Relative: 6 %
Neutro Abs: 11.9 10*3/uL — ABNORMAL HIGH (ref 1.7–7.7)
Neutrophils Relative %: 89 %
Platelets: 605 10*3/uL — ABNORMAL HIGH (ref 150–400)
RBC: 4.28 MIL/uL (ref 4.22–5.81)
RDW: 18.3 % — ABNORMAL HIGH (ref 11.5–15.5)
WBC: 13.3 10*3/uL — ABNORMAL HIGH (ref 4.0–10.5)
nRBC: 0 % (ref 0.0–0.2)

## 2021-04-06 LAB — RESP PANEL BY RT-PCR (FLU A&B, COVID) ARPGX2
Influenza A by PCR: NEGATIVE
Influenza B by PCR: NEGATIVE
SARS Coronavirus 2 by RT PCR: NEGATIVE

## 2021-04-06 LAB — COMPREHENSIVE METABOLIC PANEL
ALT: 30 U/L (ref 0–44)
AST: 28 U/L (ref 15–41)
Albumin: 2.4 g/dL — ABNORMAL LOW (ref 3.5–5.0)
Alkaline Phosphatase: 72 U/L (ref 38–126)
Anion gap: 12 (ref 5–15)
BUN: 45 mg/dL — ABNORMAL HIGH (ref 6–20)
CO2: 30 mmol/L (ref 22–32)
Calcium: 9.2 mg/dL (ref 8.9–10.3)
Chloride: 95 mmol/L — ABNORMAL LOW (ref 98–111)
Creatinine, Ser: 1.71 mg/dL — ABNORMAL HIGH (ref 0.61–1.24)
GFR, Estimated: 46 mL/min — ABNORMAL LOW (ref >60)
Glucose, Bld: 114 mg/dL — ABNORMAL HIGH (ref 70–99)
Potassium: 6.1 mmol/L — ABNORMAL HIGH (ref 3.5–5.1)
Sodium: 137 mmol/L (ref 135–145)
Total Bilirubin: 0.7 mg/dL (ref 0.3–1.2)
Total Protein: 6.8 g/dL (ref 6.5–8.1)

## 2021-04-06 LAB — PROCALCITONIN: Procalcitonin: 1.33 ng/mL

## 2021-04-06 LAB — POTASSIUM
Potassium: 6.5 mmol/L (ref 3.5–5.1)
Potassium: 6.3 mmol/L (ref 3.5–5.1)
Potassium: 6.1 mmol/L — ABNORMAL HIGH (ref 3.5–5.1)

## 2021-04-06 LAB — MRSA PCR SCREENING: MRSA by PCR: NEGATIVE

## 2021-04-06 MED ORDER — CABOZANTINIB S-MALATE 40 MG PO TABS: 40.0000 mg | ORAL_TABLET | Freq: Every day | ORAL | Status: DC

## 2021-04-06 MED ORDER — SODIUM CHLORIDE 0.9 % IV SOLN
1.0000 g | INTRAVENOUS | Status: DC
  Administered 2021-04-07: 1 g via INTRAVENOUS
  Filled 2021-04-06: qty 10

## 2021-04-06 MED ORDER — ONDANSETRON HCL 4 MG/2ML IJ SOLN: 4.0000 mg | Freq: Four times a day (QID) | INTRAMUSCULAR | Status: DC | PRN

## 2021-04-06 MED ORDER — SODIUM CHLORIDE 0.9 % IV SOLN
500.0000 mg | INTRAVENOUS | Status: DC
  Administered 2021-04-07: 500 mg via INTRAVENOUS
  Filled 2021-04-06: qty 500

## 2021-04-06 MED ORDER — MORPHINE SULFATE (PF) 4 MG/ML IV SOLN
4.0000 mg | Freq: Once | INTRAVENOUS | Status: AC
  Administered 2021-04-06: 4 mg via INTRAVENOUS
  Filled 2021-04-06: qty 1

## 2021-04-06 MED ORDER — SODIUM CHLORIDE 0.9 % IV SOLN
1.0000 g | Freq: Once | INTRAVENOUS | Status: AC
  Administered 2021-04-06: 1 g via INTRAVENOUS
  Filled 2021-04-06: qty 10

## 2021-04-06 MED ORDER — ALBUTEROL SULFATE (2.5 MG/3ML) 0.083% IN NEBU: 2.5000 mg | INHALATION_SOLUTION | RESPIRATORY_TRACT | Status: DC | PRN

## 2021-04-06 MED ORDER — SODIUM ZIRCONIUM CYCLOSILICATE 10 G PO PACK
10.0000 g | PACK | Freq: Once | ORAL | Status: AC
  Administered 2021-04-06: 10 g via ORAL
  Filled 2021-04-06: qty 1

## 2021-04-06 MED ORDER — SODIUM CHLORIDE 0.9 % IV SOLN
500.0000 mg | Freq: Once | INTRAVENOUS | Status: AC
  Administered 2021-04-06: 500 mg via INTRAVENOUS
  Filled 2021-04-06: qty 500

## 2021-04-06 MED ORDER — SENNOSIDES-DOCUSATE SODIUM 8.6-50 MG PO TABS: 1.0000 | ORAL_TABLET | Freq: Every evening | ORAL | Status: DC | PRN

## 2021-04-06 MED ORDER — ALBUTEROL SULFATE HFA 108 (90 BASE) MCG/ACT IN AERS: 2.0000 | INHALATION_SPRAY | RESPIRATORY_TRACT | Status: DC | PRN

## 2021-04-06 MED ORDER — CHLORHEXIDINE GLUCONATE CLOTH 2 % EX PADS
6.0000 | MEDICATED_PAD | Freq: Every day | CUTANEOUS | Status: DC
  Administered 2021-04-06: 6 via TOPICAL

## 2021-04-06 MED ORDER — ACETAMINOPHEN 325 MG PO TABS: 650.0000 mg | ORAL_TABLET | Freq: Four times a day (QID) | ORAL | Status: DC | PRN

## 2021-04-06 MED ORDER — ORAL CARE MOUTH RINSE
15.0000 mL | Freq: Two times a day (BID) | OROMUCOSAL | Status: DC
  Administered 2021-04-07: 15 mL via OROMUCOSAL

## 2021-04-06 MED ORDER — SODIUM CHLORIDE 0.9 % IV BOLUS
1000.0000 mL | Freq: Once | INTRAVENOUS | Status: AC
  Administered 2021-04-06: 1000 mL via INTRAVENOUS

## 2021-04-06 MED ORDER — MORPHINE SULFATE (PF) 2 MG/ML IV SOLN
1.0000 mg | INTRAVENOUS | Status: DC | PRN
  Administered 2021-04-06 – 2021-04-07 (×5): 2 mg via INTRAVENOUS
  Filled 2021-04-06 (×5): qty 1

## 2021-04-06 MED ORDER — PANTOPRAZOLE SODIUM 40 MG PO TBEC
40.0000 mg | DELAYED_RELEASE_TABLET | Freq: Every day | ORAL | Status: DC
  Administered 2021-04-06 – 2021-04-07 (×2): 40 mg via ORAL
  Filled 2021-04-06 (×2): qty 1

## 2021-04-06 MED ORDER — ACETAMINOPHEN 650 MG RE SUPP: 650.0000 mg | Freq: Four times a day (QID) | RECTAL | Status: DC | PRN

## 2021-04-06 MED ORDER — ONDANSETRON HCL 4 MG PO TABS: 4.0000 mg | ORAL_TABLET | Freq: Four times a day (QID) | ORAL | Status: DC | PRN

## 2021-04-06 NOTE — Progress Notes (Signed)
Care started at midnight in the emergency room and he was admitted early this morning after midnight by Dr. Christia Reading Opyd and I am in current agreement with his assessment and plan.  Additional changes to the plan of care been made accordingly.  The patient is a 55 year old cachectic chronically ill-appearing African-American male with past medical history significant for but not limited to colon cancer, hypertension, chronic kidney disease, metastatic renal cell carcinoma with pleural mass and pleural effusions with a Pleurx catheter on the right and hypoxic respiratory failure present at the ED for chief complaint of worsening shortness of breath.  He reports progressive worsening shortness of breath for more than a month and has been particularly dyspneic the past few days with unable to tolerate any activity.  He reports that his Pleurx catheter continues to drain and has not had any changes in output and he has not has been coughing as much.  He has not noticed any changes in bilateral foot and ankle swelling.  He reportedly lives with his sister and brother and has home health visits and had difficulty catching his breath even at rest for the past day or so.  Upon arrival to the ED he is found to be afebrile and saturating low 90s so he had to be placed on 6 L supplemental oxygen.  EKG showed sinus tachycardia with a rate of 124.  Chest x-ray developed increased interval the bilateral pleural effusions with now near complete opacification right hemithorax.  In the ED is given a liter normal saline, morphine, IV Rocephin and azithromycin and admitted.  Medical oncology as well as palliative care medicine were consulted for further goals of care discussion and the oncologist feels he has no other options and recommends transition to hospice however patient is not agreeable to this.  The oncologist feels that he is not a candidate for any further treatment and has made it very clear to him that he does not have  much time and purpose of hospice is to make it in the life as comfortable as possible.  He continues to be reluctant and did not agree to hospice discussion and that the oncology strongly believes that he will be best served with comfort measures if he agrees.   IR was consulted for possible therapeutic thoracentesis but they recommended drainage of the catheter first which is still yet to be done.  His creatinine was elevated to 1.71 and his potassium was 6.1 and further worsened to 6.5.  He is given a dose of Lokelma and will continue monitor and trend his potassium level.  Will need continued goals of care discussion given his very poor prognosis.  He is not ready to condition should clear to palliative care and wants a trial of heroic measures.  The admitting physician tried to contact the patient's sister but she did not answer the phone.  Currently awaiting Pleurx catheter drainage.  Need to continue monitor the patient's clinical response to intervention and repeat blood work and imaging in the a.m. and follow-up on palliative goals of care discussion.

## 2021-04-06 NOTE — H&P (Signed)
History and Physical    Gar Glance OZY:[id number] DOB: 23-Oct-1966 DOA: 04/22/2021  PCP: Glenda Chroman, MD   Patient coming from: Home   Chief Complaint: SOB   HPI: Erika Hussar is a 55 y.o. 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  Patient reports progressive shortness of breath over more than a month, but has been particularly dyspneic for the past few days and unable to tolerate any activity.  Reports that his Pleurx catheter continues to drain, has not noted any change in output, has not been coughing much, and has not noticed any changing his bilateral foot and ankle swelling.  Reports that he lives with his sister and brother and has home health visits.  Patient has developed difficulty catching his breath even at rest for the past day.   ED Course: Upon arrival to the ED, patient is found to be afebrile, saturating low 90s on 6 L/min of supplemental oxygen, tachypneic, tachycardic, and with stable blood pressure.  EKG features sinus tachycardia with rate 124.  Chest x-ray demonstrates interval increase in bilateral pleural effusion and now has near complete opacification of the right hemithorax.  He was given a liter of saline, morphine, Rocephin, and azithromycin in the ED.  Review of Systems:  All other systems reviewed and apart from HPI, are negative.  Past Medical History:  Diagnosis Date  . Anemia   . Cancer [facility])    colon cancer  . Colon cancer ([place])   . Dyspnea   . GIB (gastrointestinal bleeding)   . High cholesterol   . History of colon cancer 01/23/2021  . HTN (hypertension)     Past Surgical History:  Procedure Laterality Date  . CHEST TUBE INSERTION Right 02/15/2021   Procedure: INSERTION PLEURAL  DRAINAGE CATHETER;  Surgeon: Melrose Nakayama, MD;  Location: [place];  Service: Thoracic;  Laterality: Right;  . COLON RESECTION    . HEMICOLECTOMY Right   . HERNIA REPAIR    . IR THORACENTESIS ASP PLEURAL SPACE W/IMG GUIDE  01/25/2021    Social History:   reports that he quit smoking about 16 years ago. He has never used smokeless tobacco. He reports that he does not drink alcohol and does not use drugs.  No Known Allergies  Family History  Problem Relation Age of Onset  . Hypertension Mother   . Hypertension Sister   . Hypertension Brother   . Heart attack Cousin   . Cancer Father      Prior to Admission medications   Medication Sig Start Date End Date Taking? Authorizing Provider  albuterol (VENTOLIN HFA) 108 (90 Base) MCG/ACT inhaler Inhale 2 puffs into the lungs every 4 (four) hours as needed for shortness of breath or wheezing. 01/11/21  Yes [provider]  cabozantinib (CABOMETYX) 40 MG tablet Take 1 tablet (40 mg total) by mouth daily. Take on an empty stomach, 1 hour before or 2 hours after meals. 02/23/21  Yes Iruku, Arletha Pili, MD  meclizine (ANTIVERT) 12.5 MG tablet TAKE 1 TABLET (12.5 MG TOTAL) BY MOUTH 3 (THREE) TIMES DAILY AS NEEDED FOR DIZZINESS. 02/15/21  Yes Raulkar, Clide Deutscher, MD  meloxicam (MOBIC) 7.5 MG tablet TAKE 1 TABLET BY MOUTH EVERY DAY AS NEEDED FOR PAIN Patient taking differently: Take 7.5 mg by mouth daily as needed for  pain. 03/08/21  Yes Raulkar, Clide Deutscher, MD  ondansetron (ZOFRAN) 8 MG tablet Take 1 tablet (8 mg total) by mouth every 8 (eight) hours as needed for nausea. 02/11/21  Yes Benay Pike, MD  oxyCODONE (OXY IR/ROXICODONE) 5 MG immediate release tablet Take 1 tablet (5 mg total) by mouth every 6 (six) hours as needed (postop pain). 02/15/21  Yes Melrose Nakayama, MD  pantoprazole (PROTONIX) 40 MG tablet Take 40 mg by mouth daily. 01/29/21  Yes [provider]  OXYGEN Inhale 2 L into the lungs.    [provider]     Physical Exam: Vitals:   04/06/21 0209 04/06/21 0230 04/06/21 0300 04/06/21 0335  BP: 120/78 (!) 116/59 (!) 142/82 124/72  Pulse: (!) 124 (!) 120 (!) 124 (!) 131  Resp: (!) 26 (!) 26 (!) 31 (!) 27  Temp:      TempSrc:      SpO2: 94% 93% 93% 93%  Weight:      Height:        Constitutional: Labored respirations, no diaphoresis, cachectic  Eyes: PERTLA, lids and conjunctivae normal ENMT: Mucous membranes are moist. Posterior pharynx clear of any exudate or lesions.   Neck: supple, no masses  Respiratory: Severely diminished on right. Accessory muscle use. Speaking 2 or 3 words at a time.   Cardiovascular: Rate ~120 and regular. Bilateral foot and ankle swelling, Lt > Rt. Abdomen: No distension, no tenderness, soft. Bowel sounds active.  Musculoskeletal: no clubbing / cyanosis. No joint deformity upper and lower extremities.   Skin: no significant rashes, lesions, ulcers. Warm, dry, well-perfused. Neurologic: CN 2-12 grossly intact. Sensation intact. Moving all extremities.  Psychiatric: Alert and oriented to person, place, and situation. Calm and cooperative.    Labs and Imaging on Admission: I have personally reviewed following labs and imaging studies  CBC: Recent Labs  Lab 04/06/21 0221  WBC 13.3*  NEUTROABS 11.9*  HGB 10.2*  HCT 35.0*  MCV 81.8  PLT 735*   Basic Metabolic Panel: Recent Labs  Lab 04/06/21 0312  NA 137  K 6.1*  CL 95*  CO2 30  GLUCOSE 114*  BUN 45*  CREATININE 1.71*  CALCIUM 9.2   GFR: Estimated Creatinine Clearance: 46.4 mL/min (A) (by C-G formula based on SCr of 1.71 mg/dL (H)). Liver Function Tests: Recent Labs  Lab 04/06/21 0312  AST 28  ALT 30  ALKPHOS 72  BILITOT 0.7  PROT 6.8  ALBUMIN 2.4*   No results for input(s): LIPASE, AMYLASE in the last 168 hours. No results for input(s): AMMONIA in the last 168 hours. Coagulation Profile: No results for input(s): INR, PROTIME in the last 168 hours. Cardiac Enzymes: No results  for input(s): CKTOTAL, CKMB, CKMBINDEX, TROPONINI in the last 168 hours. BNP (last 3 results) No results for input(s): PROBNP in the last 8760 hours. HbA1C: No results for input(s): HGBA1C in the last 72 hours. CBG: No results for input(s): GLUCAP in the last 168 hours. Lipid Profile: No results for input(s): CHOL, HDL, LDLCALC, TRIG, CHOLHDL, LDLDIRECT in the last 72 hours. Thyroid Function Tests: No results for input(s): TSH, T4TOTAL, FREET4, T3FREE, THYROIDAB in the last 72 hours. Anemia Panel: No results for input(s): VITAMINB12, FOLATE, FERRITIN, TIBC, IRON, RETICCTPCT in the last 72 hours. Urine analysis:    Component Value Date/Time   COLORURINE COLORLESS (A) 01/23/2021 1434   APPEARANCEUR CLEAR 01/23/2021 1434   LABSPEC 1.003 (L) 01/23/2021 1434   PHURINE 6.0 01/23/2021 1434   GLUCOSEU NEGATIVE 01/23/2021  [place] (A) 01/23/2021 1434   BILIRUBINUR NEGATIVE 01/23/2021 1434   [place] 01/23/2021 1434   PROTEINUR NEGATIVE 01/23/2021 1434   NITRITE NEGATIVE 01/23/2021 1434   LEUKOCYTESUR NEGATIVE 01/23/2021 1434   Sepsis Labs: @LABRCNTIP (procalcitonin:4,lacticidven:4) ) Recent Results (from the past 240 hour(s))  Resp Panel by RT-PCR (Flu A&B, Covid) Nasopharyngeal Swab     Status: None   Collection Time: 04/06/21  2:21 AM   Specimen: Nasopharyngeal Swab; Nasopharyngeal(NP) swabs in vial transport medium  Result Value Ref Range Status   SARS Coronavirus 2 by RT PCR NEGATIVE NEGATIVE Final    Comment: (NOTE) SARS-CoV-2 target nucleic acids are NOT DETECTED.  The SARS-CoV-2 RNA is generally detectable in upper respiratory specimens during the acute phase of infection. The lowest concentration of SARS-CoV-2 viral copies this assay can detect is 138 copies/mL. A negative result does not preclude SARS-Cov-2 infection and should not be used as the sole basis for treatment or other patient management decisions. A negative result may occur with   improper specimen collection/handling, submission of specimen other than nasopharyngeal swab, presence of viral mutation(s) within the areas targeted by this assay, and inadequate number of viral copies(<138 copies/mL). A negative result must be combined with clinical observations, patient history, and epidemiological information. The expected result is Negative.  Fact Sheet for Patients:  EntrepreneurPulse.com.au  Fact Sheet for Healthcare Providers:  IncredibleEmployment.be  This test is no t yet approved or cleared by the [place] FDA and  has been authorized for detection and/or diagnosis of SARS-CoV-2 by FDA under an Emergency Use Authorization (EUA). This EUA will remain  in effect (meaning this test can be used) for the duration of the COVID-19 declaration under Section 564(b)(1) of the Act, 21 U.S.C.section 360bbb-3(b)(1), unless the authorization is terminated  or revoked sooner.       Influenza A by PCR NEGATIVE NEGATIVE Final   Influenza B by PCR NEGATIVE NEGATIVE Final    Comment: (NOTE) The Xpert Xpress SARS-CoV-2/FLU/RSV plus assay is intended as an aid in the diagnosis of influenza from Nasopharyngeal swab specimens and should not be used as a sole basis for treatment. Nasal washings and aspirates are unacceptable for Xpert Xpress SARS-CoV-2/FLU/RSV testing.  Fact Sheet for Patients: EntrepreneurPulse.com.au  Fact Sheet for Healthcare Providers: IncredibleEmployment.be  This test is not yet approved or cleared by the [place] FDA and has been authorized for detection and/or diagnosis of SARS-CoV-2 by FDA under an Emergency Use Authorization (EUA). This EUA will remain in effect (meaning this test can be used) for the duration of the COVID-19 declaration under Section 564(b)(1) of the Act, 21 U.S.C. section 360bbb-3(b)(1), unless the authorization is terminated  or revoked.  Performed at [facility], [place] [address], [place], [place] [address]      Radiological Exams on Admission: DG Chest Port 1 View  Result Date: 04/06/2021 CLINICAL DATA:  Increased shortness of breath.  Colon cancer. EXAM: PORTABLE CHEST 1 VIEW COMPARISON:  CT chest 03/12/2021, chest x-ray 03/09/2021 FINDINGS: Right chest wall PleurX catheter in similar position. The heart size and mediastinal contours are unchanged and only partially visualized. Persistent slight right to left mediastinal shift. Interval increase in hazy left lower lobe opacity. No pulmonary edema. Interval increase in almost now complete opacification of the right hemithorax. Interval increase in a small to moderate left pleural effusion/disease. No pneumothorax. No acute osseous abnormality. IMPRESSION: 1. Interval increase in almost now complete opacification of the right hemithorax. Findings consistent with  known pleural thickening, pleural effusion, and pulmonary disease. 2. Interval increase in small to moderate left pleural effusion/disease. 3. Left PleurX catheter in similar position. Electronically Signed   By: Iven Finn M.D.   On: 04/06/2021 01:33    EKG: Independently reviewed. Sinus tachycardia, rate 124, LVH with repolarization abnormality.   Assessment/Plan   1. Metastatic RCC; pleural effusions; acute on chronic hypoxic respiratory failure  - Patient with metastatic renal cell carcinoma with pleural masses, malignant pleural effusion, and right Pleurx catheter using 2.5 Lpm O2 at home presents with progressive SOB, is markedly dyspneic at rest, now requiring 6 Lpm, and has interval increase in bilateral pleural effusions  - He was given morphine in ED and asks for more morphine for air-hunger but does not want to transition to strictly palliative care yet  - Consult IR for possible therapeutic thoracentesis, consult palliative care, continue supportive care    2. CKD IIIa;  hyperkalemia  - SCr is 1.71 on admission, similar to last month; potassium is 6.1  - Lokelma x1, renally-dose medication, repeat potassium later today    3. Goals of care  - Discussion somewhat limited by patient's respiratory distress  - He seems to understand prognosis and wants morphine for air-hunger but not ready to transition to strictly palliative care and continues to want a trial of heroic measures  - He agreed to contacting his sister but she did not answer her phone and full mailbox; he is agreeable to continuing discussion and palliative consult    DVT prophylaxis: SCDs  Code Status: Full  Level of Care: Level of care: Stepdown Family Communication: Sister not answering phone, voice mailbox full  Disposition Plan:  Patient is from: home  Anticipated d/c is to: TBD Anticipated d/c date is: 04/09/21 Patient currently: Pending possible thoracentesis, palliative consultation, symptom control  Consults called: None  Admission status: Inpatient     Vianne Bulls, MD [facility]  04/06/2021, 5:15 AM

## 2021-04-06 NOTE — ED Notes (Addendum)
ICU RN's at bedside to drain pleurx

## 2021-04-06 NOTE — ED Notes (Signed)
Patient went stated he needed to have a bowel movement. Patient was unable to use the bedpan after several attempts. Patient was also unable to use the bathroom. Patient was transported back to his room, and is now in bed.

## 2021-04-06 NOTE — ED Notes (Signed)
Patient is ready for transport.  

## 2021-04-06 NOTE — Progress Notes (Signed)
Asked [facility] to review the case and evaluate for potential tPA through Catheter. I spoke with PA Enid Cutter who relayed that Dr. Roxan Hockey is familiar with the patient and he and the PA Myron assessed the x-rays and the CT scan and it was felt that most of the pleural space is now full of tumor but there is no significant fluid that could be drained.  Dr. Roxan Hockey did not feel that there is any benefit in trying to salvage the Pleurx catheter with lytic therapy or tPA.  They stated that they did not have anything else to offer for the patient from a surgical standpoint.

## 2021-04-06 NOTE — Consult Note (Signed)
[place]  [phone] Fax:(336) [place]    Referral MD  Reason for Referral: inpatient admission, ongoing metastatic [place]  Chief Complaint  Patient presents with  . 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  He has metastatic disease and since it was papillary subtype, we have recommended single agent cabozantinib.  He did not take it for a long time despite multiple phone calls and recommendations to start as soon as possible if he is hoping to treat the cancer.  He most recently started maybe about 3 to 4 weeks ago but again cannot quantify if he has taken it for a couple weeks. He is currently not taking it, according to the patient he stopped it a couple weeks ago or more, the reason he gave to me today was he has no power and he stopped taking the pills since he had no power. He is currently admitted with worsening shortness of breath, has gotten more frail.  He denies any pain for me today.  His says he is not going to agree to hospice and he wanted [place] to give him 1 more chance.   Rest of the pertinent review of systems unobtainable, cannot comprehend patient's speech and he is too weak to speak.  He was started on antibiotics in the ER.   Past Medical History:  Diagnosis Date  . Anemia   . Cancer [facility])    colon cancer  . Colon cancer ([place])   . Dyspnea   . GIB (gastrointestinal bleeding)   . High cholesterol   . History of colon cancer 01/23/2021  . HTN (hypertension)   :  Past Surgical History:  Procedure Laterality Date  . CHEST TUBE INSERTION Right 02/15/2021   Procedure: INSERTION PLEURAL DRAINAGE CATHETER;  Surgeon: Melrose Nakayama, MD;  Location: [facility];  Service: Thoracic;  Laterality: Right;  . COLON  RESECTION    . HEMICOLECTOMY Right   . HERNIA REPAIR    . IR THORACENTESIS ASP PLEURAL SPACE W/IMG GUIDE  01/25/2021  :  Current Facility-Administered Medications  Medication Dose Route Frequency Provider Last Rate Last Admin  . acetaminophen (TYLENOL) tablet 650 mg  650 mg Oral Q6H PRN Opyd, Ilene Qua, MD       Or  . acetaminophen (TYLENOL) suppository 650 mg  650 mg Rectal Q6H PRN Opyd, Ilene Qua, MD      . albuterol (PROVENTIL) (2.5 MG/3ML) 0.083% nebulizer solution 2.5 mg  2.5 mg Nebulization Q4H PRN Opyd, Ilene Qua, MD      . cabozantinib (CABOMETYX) tablet 40 mg  40 mg Oral Daily Opyd, Ilene Qua, MD      . morphine 2 MG/ML injection 1-2 mg  1-2 mg Intravenous Q3H PRN Opyd, Ilene Qua, MD      . ondansetron (ZOFRAN) tablet 4 mg  4 mg Oral Q6H PRN Opyd, Ilene Qua, MD       Or  . ondansetron (ZOFRAN) injection 4 mg  4 mg Intravenous Q6H PRN Opyd, Ilene Qua, MD      . pantoprazole (PROTONIX) EC tablet 40 mg  40 mg Oral Daily Opyd, Ilene Qua, MD      . senna-docusate (Senokot-S) tablet 1 tablet  1 tablet Oral QHS PRN Opyd, Ilene Qua, MD      .  sodium zirconium cyclosilicate (LOKELMA) packet 10 g  10 g Oral Once Opyd, Ilene Qua, MD       Current Outpatient Medications  Medication Sig Dispense Refill  . albuterol (VENTOLIN HFA) 108 (90 Base) MCG/ACT inhaler Inhale 2 puffs into the lungs every 4 (four) hours as needed for shortness of breath or wheezing.    . cabozantinib (CABOMETYX) 40 MG tablet Take 1 tablet (40 mg total) by mouth daily. Take on an empty stomach, 1 hour before or 2 hours after meals. 30 tablet 1  . meclizine (ANTIVERT) 12.5 MG tablet TAKE 1 TABLET (12.5 MG TOTAL) BY MOUTH 3 (THREE) TIMES DAILY AS NEEDED FOR DIZZINESS. 90 tablet 3  . meloxicam (MOBIC) 7.5 MG tablet TAKE 1 TABLET BY MOUTH EVERY DAY AS NEEDED FOR PAIN (Patient taking differently: Take 7.5 mg by mouth daily as needed for pain.) 30 tablet 1  . ondansetron (ZOFRAN) 8 MG tablet Take 1 tablet (8 mg total) by mouth  every 8 (eight) hours as needed for nausea. 30 tablet 3  . oxyCODONE (OXY IR/ROXICODONE) 5 MG immediate release tablet Take 1 tablet (5 mg total) by mouth every 6 (six) hours as needed (postop pain). 28 tablet 0  . pantoprazole (PROTONIX) 40 MG tablet Take 40 mg by mouth daily.    . OXYGEN Inhale 2 L into the lungs.       No Known Allergies:  Family History  Problem Relation Age of Onset  . Hypertension Mother   . Hypertension Sister   . Hypertension Brother   . Heart attack Cousin   . Cancer Father   :  Social History   Socioeconomic History  . Marital status: Single    Spouse name: Not on file  . Number of children: Not on file  . Years of education: Not on file  . Highest education level: Not on file  Occupational History  . Not on file  Tobacco Use  . Smoking status: Former Smoker    Quit date: 2006    Years since quitting: 16.4  . Smokeless tobacco: Never Used  Vaping Use  . Vaping Use: Never used  Substance and Sexual Activity  . Alcohol use: No  . Drug use: No  . Sexual activity: Not Currently  Other Topics Concern  . Not on file  Social History Narrative  . Not on file   Social Determinants of Health   Financial Resource Strain: Not on file  Food Insecurity: Not on file  Transportation Needs: Not on file  Physical Activity: Not on file  Stress: Not on file  Social Connections: Not on file  Intimate Partner Violence: Not on file    Exam: Patient Vitals for the past 24 hrs:  BP Temp Temp src Pulse Resp SpO2 Height Weight  04/06/21 1000 125/78 -- -- (!) 113 (!) 22 97 % -- --  04/06/21 0815 110/63 -- -- (!) 115 13 96 % -- --  04/06/21 0500 107/74 -- -- (!) 118 20 96 % -- --  04/06/21 0430 130/76 -- -- (!) 131 (!) 21 92 % -- --  04/06/21 0400 126/71 -- -- (!) 123 (!) 26 90 % -- --  04/06/21 0335 124/72 -- -- (!) 131 (!) 27 93 % -- --  04/06/21 0300 (!) 142/82 -- -- (!) 124 (!) 31 93 % -- --  04/06/21 0230 (!) 116/59 -- -- (!) 120 (!) 26 93 % -- --   04/06/21 0209 120/78 -- -- (!) 124 (!) 26  94 % -- --  04/20/2021 2357 120/71 (!) 97.2 F (36.2 C) Axillary (!) 121 (!) 31 93 % 6' (1.829 m) 150 lb (68 kg)    Physical Exam Constitutional:      General: He is in acute distress.     Appearance: He is ill-appearing (Very frail and cachectic).  Neurological:     Mental Status: He is alert.  Psychiatric:        Behavior: Behavior normal.    Most of her discussion today was focused towards goals of care.  Lab Results  Component Value Date   WBC 13.3 (H) 04/06/2021   HGB 10.2 (L) 04/06/2021   HCT 35.0 (L) 04/06/2021   PLT 605 (H) 04/06/2021   GLUCOSE 114 (H) 04/06/2021   ALT 30 04/06/2021   AST 28 04/06/2021   NA 137 04/06/2021   K 6.5 (HH) 04/06/2021   CL 95 (L) 04/06/2021   CREATININE 1.71 (H) 04/06/2021   BUN 45 (H) 04/06/2021   CO2 30 04/06/2021    DG Chest 2 View  Result Date: 03/09/2021 CLINICAL DATA:  PleurX catheter placement EXAM: CHEST - 2 VIEW COMPARISON:  February 12, 2021 FINDINGS: PleurX catheter placed on the right. No pneumothorax. Large mass right upper lobe remains. Suspected loculated pleural effusion with areas of consolidation throughout the right lung appear similar to recent study. On the left, there is a small pleural effusion. There is ill-defined opacity in the left upper lobe, likely representing pneumonia. Slightly increased opacity in this area compared to 1 month prior. Heart is prominent, stable, with pulmonary vascularity normal. Suspected hilar adenopathy bilaterally. Soft tissue fullness left paratracheal region, likely due to adenopathy. No bone lesions IMPRESSION: PleurX catheter placed on the right without pneumothorax. Mass lesions and areas of suspected adenopathy consistent with known underlying neoplasm. Opacity throughout much of the right lung consistent with combination of areas of pleural effusion in airspace consolidation. Increased opacity left upper lobe, likely pneumonia, although neoplasm  underlying is possible in this area. Small left pleural effusion. Stable cardiac silhouette. Electronically Signed   By: Lowella Grip III M.D.   On: 03/09/2021 12:47   CT CHEST WO CONTRAST  Result Date: 03/12/2021 CLINICAL DATA:  Abnormal fall chest x-ray with pleural fluid and masslike areas in the chest. EXAM: CT CHEST WITHOUT CONTRAST TECHNIQUE: Multidetector CT imaging of the chest was performed following the standard protocol without IV contrast. COMPARISON:  Chest x-ray of Mar 09, 2021 and prior imaging of the abdomen and pelvis from March of 2022 as well as biopsy images from April of 2022. FINDINGS: Cardiovascular: Shift of heart mediastinal structures from RIGHT to LEFT, see below. Pericardial effusion with similar volume. Pericardial nodularity in adjacent adenopathy increased. Mediastinum/Nodes: Thoracic inlet structures are normal. No axillary lymphadenopathy. Bulky subcarinal adenopathy measuring as much as 4.0 x 6.1 cm, previously 3.2 cm short axis. AP window adenopathy increased (image 65/2) this could be a mixture of AP window lymph nodes and adjacent pleural disease but measuring approximately 2.5 cm greatest thickness as compared to 2 cm on the previous abdominal CT. Lungs/Pleura: Heart is shifted into the LEFT chest due to worsening pleural disease and enlarging effusion in the RIGHT chest. Loculated appearance of fluid and mixed density in the RIGHT upper chest corresponds to the chest x-ray abnormality of convex protrusions along the upper margin of the PleurX catheter. The PleurX catheter is seen in the posterior RIGHT chest. This is situated more in the anterior RIGHT chest in fills the  RIGHT lung apex. Pleural thickening in the RIGHT lower chest is markedly increased compared to previous imaging of just 2 months ago, now circumferential throughout the RIGHT chest. In the anterior chest in the cardiophrenic recess there is near confluent disease that tracks along the pleural space  peripherally and involves collapsed lung in pleural surfaces including the major fissure tracking into the RIGHT lung apex. This measures in total approximately 8.3 x 8.6 cm greatest axial dimension previously individual nodules in this area while not insignificant ranged in size from 1 cm to 2 cm and in total while not confluent on the prior study measuring approximately 5.4 x 3.4 cm in axial dimension. Shift of mediastinum may be slightly increased but is present on the prior study as well. In addition to circumferential soft tissue with increase in the RIGHT chest there is increasing pleural fluid in the LEFT chest with signs of pleural involvement (image 28/2) 1.8 cm pleural base nodule. (Image 61/2 1.8 x 1.1 cm pleural base nodule. Similar size nodule in the anterolateral LEFT upper chest. Diminished aeration of lung in the RIGHT chest compared to previous imaging. Septal thickening and ground-glass in the RIGHT middle lobe. Minimal aeration of the RIGHT lower lobe. Volume loss also in the RIGHT upper lobe due to worsening disease Pleural nodularity the extends along the fissure in the LEFT chest as well. Major airways are patent with narrowing of peripheral airways in the RIGHT chest. Upper Abdomen: Large LEFT renal mass not well evaluated better seen on recent CT. Bulky para-aortic adenopathy and renal venous extension also better evaluated on recent CT. Expansion of the LEFT renal vein may be increased is a crosses the midline, difficult to assess. Increasing size of hepatic metastatic lesions and likely with new hepatic metastatic disease. On image 144 of series 2 measuring 5 x 2.3 cm this previously measured approximately 2.9 x 2.0 cm. New lesions are suspected with numerous low-attenuation foci now seen throughout the liver. For instance on image 178 of series 2 is a 16 mm lesion not seen on the previous study. Musculoskeletal: No acute musculoskeletal process. No destructive bone finding IMPRESSION: 1.  Marked interval worsening of pleural disease particularly in the RIGHT chest, likely associated with small amounts of hemorrhage scattered throughout a loculated area in the superior aspect of the RIGHT chest accounting for the convex abnormality seen on chest x-ray above the PleurX catheter. 2. PleurX catheter terminates in the upper to mid chest below this area likely tracking along the major fissure though difficult to determine given the extent of pleural disease. 3. Associated volume loss in the RIGHT chest as described. Area of either lymphangitic tumor spread in the RIGHT middle lobe versus postobstructive pneumonitis. 4. Signs of new and worsening hepatic metastatic disease. 5. Similar volume of pericardial effusion. These results will be called to the ordering clinician or representative by the Radiologist Assistant, and communication documented in the PACS or [employer]. Electronically Signed   By: Zetta Bills M.D.   On: 03/12/2021 14:18   DG Chest Port 1 View  Result Date: 04/06/2021 CLINICAL DATA:  Increased shortness of breath.  Colon cancer. EXAM: PORTABLE CHEST 1 VIEW COMPARISON:  CT chest 03/12/2021, chest x-ray 03/09/2021 FINDINGS: Right chest wall PleurX catheter in similar position. The heart size and mediastinal contours are unchanged and only partially visualized. Persistent slight right to left mediastinal shift. Interval increase in hazy left lower lobe opacity. No pulmonary edema. Interval increase in almost now complete opacification of the  right hemithorax. Interval increase in a small to moderate left pleural effusion/disease. No pneumothorax. No acute osseous abnormality. IMPRESSION: 1. Interval increase in almost now complete opacification of the right hemithorax. Findings consistent with known pleural thickening, pleural effusion, and pulmonary disease. 2. Interval increase in small to moderate left pleural effusion/disease. 3. Left PleurX catheter in similar position.  Electronically Signed   By: Iven Finn M.D.   On: 04/06/2021 01:33    Pathology: FINAL MICROSCOPIC DIAGNOSIS:   A. SOFT TISSUE, RIGHT PLEURAL, NEEDLE CORE BIOPSY:  - Metastatic carcinoma  - See comment   COMMENT   By immunohistochemistry, the neoplastic cells are positive for PAX 8 and  racemase but negative for cytokeratin 7, cytokeratin 20, CDX2, TTF-1 and  CA-IX (patchy, weak staining). Overall, the morphology and  immunophenotype are consistent with metastatic renal cell carcinoma.  Dr. Jeannie Done reviewed the case and agrees with the above diagnosis. Dr.  Margaretmary Eddy was notified of these results on February 10, 2021.   DG Chest 2 View  Result Date: 03/09/2021 CLINICAL DATA:  PleurX catheter placement EXAM: CHEST - 2 VIEW COMPARISON:  February 12, 2021 FINDINGS: PleurX catheter placed on the right. No pneumothorax. Large mass right upper lobe remains. Suspected loculated pleural effusion with areas of consolidation throughout the right lung appear similar to recent study. On the left, there is a small pleural effusion. There is ill-defined opacity in the left upper lobe, likely representing pneumonia. Slightly increased opacity in this area compared to 1 month prior. Heart is prominent, stable, with pulmonary vascularity normal. Suspected hilar adenopathy bilaterally. Soft tissue fullness left paratracheal region, likely due to adenopathy. No bone lesions IMPRESSION: PleurX catheter placed on the right without pneumothorax. Mass lesions and areas of suspected adenopathy consistent with known underlying neoplasm. Opacity throughout much of the right lung consistent with combination of areas of pleural effusion in airspace consolidation. Increased opacity left upper lobe, likely pneumonia, although neoplasm underlying is possible in this area. Small left pleural effusion. Stable cardiac silhouette. Electronically Signed   By: Lowella Grip III M.D.   On: 03/09/2021 12:47   CT CHEST WO  CONTRAST  Result Date: 03/12/2021 CLINICAL DATA:  Abnormal fall chest x-ray with pleural fluid and masslike areas in the chest. EXAM: CT CHEST WITHOUT CONTRAST TECHNIQUE: Multidetector CT imaging of the chest was performed following the standard protocol without IV contrast. COMPARISON:  Chest x-ray of Mar 09, 2021 and prior imaging of the abdomen and pelvis from March of 2022 as well as biopsy images from April of 2022. FINDINGS: Cardiovascular: Shift of heart mediastinal structures from RIGHT to LEFT, see below. Pericardial effusion with similar volume. Pericardial nodularity in adjacent adenopathy increased. Mediastinum/Nodes: Thoracic inlet structures are normal. No axillary lymphadenopathy. Bulky subcarinal adenopathy measuring as much as 4.0 x 6.1 cm, previously 3.2 cm short axis. AP window adenopathy increased (image 65/2) this could be a mixture of AP window lymph nodes and adjacent pleural disease but measuring approximately 2.5 cm greatest thickness as compared to 2 cm on the previous abdominal CT. Lungs/Pleura: Heart is shifted into the LEFT chest due to worsening pleural disease and enlarging effusion in the RIGHT chest. Loculated appearance of fluid and mixed density in the RIGHT upper chest corresponds to the chest x-ray abnormality of convex protrusions along the upper margin of the PleurX catheter. The PleurX catheter is seen in the posterior RIGHT chest. This is situated more in the anterior RIGHT chest in fills the RIGHT lung apex. Pleural thickening in  the RIGHT lower chest is markedly increased compared to previous imaging of just 2 months ago, now circumferential throughout the RIGHT chest. In the anterior chest in the cardiophrenic recess there is near confluent disease that tracks along the pleural space peripherally and involves collapsed lung in pleural surfaces including the major fissure tracking into the RIGHT lung apex. This measures in total approximately 8.3 x 8.6 cm greatest axial  dimension previously individual nodules in this area while not insignificant ranged in size from 1 cm to 2 cm and in total while not confluent on the prior study measuring approximately 5.4 x 3.4 cm in axial dimension. Shift of mediastinum may be slightly increased but is present on the prior study as well. In addition to circumferential soft tissue with increase in the RIGHT chest there is increasing pleural fluid in the LEFT chest with signs of pleural involvement (image 28/2) 1.8 cm pleural base nodule. (Image 61/2 1.8 x 1.1 cm pleural base nodule. Similar size nodule in the anterolateral LEFT upper chest. Diminished aeration of lung in the RIGHT chest compared to previous imaging. Septal thickening and ground-glass in the RIGHT middle lobe. Minimal aeration of the RIGHT lower lobe. Volume loss also in the RIGHT upper lobe due to worsening disease Pleural nodularity the extends along the fissure in the LEFT chest as well. Major airways are patent with narrowing of peripheral airways in the RIGHT chest. Upper Abdomen: Large LEFT renal mass not well evaluated better seen on recent CT. Bulky para-aortic adenopathy and renal venous extension also better evaluated on recent CT. Expansion of the LEFT renal vein may be increased is a crosses the midline, difficult to assess. Increasing size of hepatic metastatic lesions and likely with new hepatic metastatic disease. On image 144 of series 2 measuring 5 x 2.3 cm this previously measured approximately 2.9 x 2.0 cm. New lesions are suspected with numerous low-attenuation foci now seen throughout the liver. For instance on image 178 of series 2 is a 16 mm lesion not seen on the previous study. Musculoskeletal: No acute musculoskeletal process. No destructive bone finding IMPRESSION: 1. Marked interval worsening of pleural disease particularly in the RIGHT chest, likely associated with small amounts of hemorrhage scattered throughout a loculated area in the superior aspect  of the RIGHT chest accounting for the convex abnormality seen on chest x-ray above the PleurX catheter. 2. PleurX catheter terminates in the upper to mid chest below this area likely tracking along the major fissure though difficult to determine given the extent of pleural disease. 3. Associated volume loss in the RIGHT chest as described. Area of either lymphangitic tumor spread in the RIGHT middle lobe versus postobstructive pneumonitis. 4. Signs of new and worsening hepatic metastatic disease. 5. Similar volume of pericardial effusion. These results will be called to the ordering clinician or representative by the Radiologist Assistant, and communication documented in the PACS or [employer]. Electronically Signed   By: Zetta Bills M.D.   On: 03/12/2021 14:18   DG Chest Port 1 View  Result Date: 04/06/2021 CLINICAL DATA:  Increased shortness of breath.  Colon cancer. EXAM: PORTABLE CHEST 1 VIEW COMPARISON:  CT chest 03/12/2021, chest x-ray 03/09/2021 FINDINGS: Right chest wall PleurX catheter in similar position. The heart size and mediastinal contours are unchanged and only partially visualized. Persistent slight right to left mediastinal shift. Interval increase in hazy left lower lobe opacity. No pulmonary edema. Interval increase in almost now complete opacification of the right hemithorax. Interval increase in a  small to moderate left pleural effusion/disease. No pneumothorax. No acute osseous abnormality. IMPRESSION: 1. Interval increase in almost now complete opacification of the right hemithorax. Findings consistent with known pleural thickening, pleural effusion, and pulmonary disease. 2. Interval increase in small to moderate left pleural effusion/disease. 3. Left PleurX catheter in similar position. Electronically Signed   By: Iven Finn M.D.   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  He was recommended to start first-line cabozantinib for treatment of metastatic papillary RCC however he did not start the medication for maybe a month to 6weeks from the time its prescribed, basically his personal choice despite multiple phone calls.  He most recently got the prescription and maybe took it for a couple weeks at the most, again according to the patient he has not been taking it for weeks now.  He tells me that the primary reason is because he has no power and he just stopped taking all the pills. He has not been showing up to his appointments, despite reminders.  He overall has gotten very frail, cachectic with rapidly declining performance status.  I have discussed today with him about considering palliative care and hospice.  I do not believe he is a candidate for any further treatment.  I have made it very clear for him that he does not have much time and purpose of hospice is to make the end-of-life as comfortable as possible.  He is very reluctant, did not agree to hospice discussion.  I have made it clear to him that we will not be able to prescribe any additional treatment for the management of metastatic RCC and he understands this.  I again strongly believe that he is best served with comfort measures if he agrees.  I have offered to talk to his family members but he declined.    Please feel free to contact us with any other questions.

## 2021-04-06 NOTE — Significant Event (Signed)
Came to bedside to drain right sided pleurex drain. Patient states it has been two years since he last drained it but he typically gets an entire bottle full (aprox. 1 L.) Per I and O, 1 L drained May 18th. During draining very little fluid drained, primarily pulling foam and air. Total drained 175 mls in 16  Mins. IR PA at bedside aware of low volume. MD paged. Site re-dressed and tubing capped.

## 2021-04-06 NOTE — ED Notes (Signed)
Patient moved to [place]

## 2021-04-06 NOTE — ED Provider Notes (Signed)
[facility] DEPT Provider Note  CSN: [id number] Arrival date & time: 04/15/2021 2327  Chief Complaint(s) Shortness of Breath  HPI [name] is a 55 y.o. 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  Patient is normally on 2-1/2 L nasal cannula.  Shortness of breath worse with exertion and certain positions.  Alleviated slightly by sitting upright.  No associated chest pain but is complaining of mid and upper back pain as aching in nature.  Worse with inspiration.  No fevers or chills.  No worsening of his chronic cough.  No congestion.  No abdominal pain.  No other physical complaints.  HPI  Past Medical History Past Medical History:  Diagnosis Date  . Anemia   . Cancer [facility])    colon cancer  . Colon cancer ([place])   . Dyspnea   . GIB (gastrointestinal bleeding)   . High cholesterol   . History of colon cancer 01/23/2021  . HTN (hypertension)    Patient Active Problem List   Diagnosis Date Noted  . Acute on chronic respiratory failure with hypoxia ([place]) 04/06/2021  . Goals of care, counseling/discussion 03/16/2021  . Normocytic normochromic anemia 03/02/2021  . Pleural effusion, malignant 01/28/2021  . Unintentional weight loss of more than 10 pounds 01/28/2021  . Pleural effusion on right 01/23/2021  . History of colon cancer 01/23/2021  . Essential hypertension 01/23/2021  . Metastatic renal cell carcinoma ([place]) 01/23/2021  . Acute respiratory failure with hypoxia ([place]) 01/23/2021  . Pericardial effusion 01/23/2021   Home Medication(s) Prior to Admission medications   Medication Sig Start Date End Date Taking? Authorizing Provider  albuterol (VENTOLIN HFA) 108 (90 Base) MCG/ACT inhaler Inhale 2 puffs into the lungs every 4 (four) hours as needed  for shortness of breath or wheezing. 01/11/21  Yes [provider]  cabozantinib (CABOMETYX) 40 MG tablet Take 1 tablet (40 mg total) by mouth daily. Take on an empty stomach, 1 hour before or 2 hours after meals. 02/23/21  Yes Iruku, Arletha Pili, MD  meclizine (ANTIVERT) 12.5 MG tablet TAKE 1 TABLET (12.5 MG TOTAL) BY MOUTH 3 (THREE) TIMES DAILY AS NEEDED FOR DIZZINESS. 02/15/21  Yes Raulkar, Clide Deutscher, MD  meloxicam (MOBIC) 7.5 MG tablet TAKE 1 TABLET BY MOUTH EVERY DAY AS NEEDED FOR PAIN Patient taking differently: Take 7.5 mg by mouth daily as needed for pain. 03/08/21  Yes Raulkar, Clide Deutscher, MD  ondansetron (ZOFRAN) 8 MG tablet Take 1 tablet (8 mg total) by mouth every 8 (eight) hours as needed for nausea. 02/11/21  Yes Benay Pike, MD  oxyCODONE (OXY IR/ROXICODONE) 5 MG immediate release tablet Take 1 tablet (5 mg total) by mouth every 6 (six) hours as needed (postop pain). 02/15/21  Yes Melrose Nakayama, MD  pantoprazole (PROTONIX) 40 MG tablet Take 40 mg by mouth daily. 01/29/21  Yes [provider]  OXYGEN Inhale 2 L into the lungs.    [provider]  Past Surgical History Past Surgical History:  Procedure Laterality Date  . CHEST TUBE INSERTION Right 02/15/2021   Procedure: INSERTION PLEURAL DRAINAGE CATHETER;  Surgeon: Melrose Nakayama, MD;  Location: [facility];  Service: Thoracic;  Laterality: Right;  . COLON RESECTION    . HEMICOLECTOMY Right   . HERNIA REPAIR    . IR THORACENTESIS ASP PLEURAL SPACE W/IMG GUIDE  01/25/2021   Family History Family History  Problem Relation Age of Onset  . Hypertension Mother   . Hypertension Sister   . Hypertension Brother   . Heart attack Cousin   . Cancer Father     Social History Social History   Tobacco Use  . Smoking status: Former Smoker    Quit date: 2006    Years since  quitting: 16.4  . Smokeless tobacco: Never Used  Vaping Use  . Vaping Use: Never used  Substance Use Topics  . Alcohol use: No  . Drug use: No   Allergies Patient has no known allergies.  Review of Systems Review of Systems All other systems are reviewed and are negative for acute change except as noted in the HPI  Physical Exam Vital Signs  I have reviewed the triage vital signs BP 120/78   Pulse (!) 124   Temp (!) 97.2 F (36.2 C) (Axillary)   Resp (!) 26   Ht 6' (1.829 m)   Wt 68 kg   SpO2 94%   BMI 20.34 kg/m   Physical Exam Vitals reviewed.  Constitutional:      General: He is not in acute distress.    Appearance: He is well-developed. He is not diaphoretic.  HENT:     Head: Normocephalic and atraumatic.     Nose: Nose normal.  Eyes:     General: No scleral icterus.       Right eye: No discharge.        Left eye: No discharge.     Conjunctiva/sclera: Conjunctivae normal.     Pupils: Pupils are equal, round, and reactive to light.  Cardiovascular:     Rate and Rhythm: Normal rate and regular rhythm.     Heart sounds: No murmur heard. No friction rub. No gallop.   Pulmonary:     Effort: Tachypnea and respiratory distress present.     Breath sounds: No stridor. Examination of the right-middle field reveals decreased breath sounds. Examination of the right-lower field reveals decreased breath sounds. Examination of the left-lower field reveals decreased breath sounds. Decreased breath sounds present. No rales.  Abdominal:     General: There is no distension.     Palpations: Abdomen is soft.     Tenderness: There is no abdominal tenderness.  Musculoskeletal:        General: No tenderness.     Cervical back: Normal range of motion and neck supple.  Skin:    General: Skin is warm and dry.     Findings: No erythema or rash.  Neurological:     Mental Status: He is alert and oriented to person, place, and time.     ED Results and Treatments Labs (all labs  ordered are listed, but only abnormal results are displayed) Labs Reviewed  CBC WITH DIFFERENTIAL/PLATELET - Abnormal; Notable for the following components:      Result Value   WBC 13.3 (*)    Hemoglobin 10.2 (*)    HCT 35.0 (*)    MCH 23.8 (*)    MCHC 29.1 (*)    RDW 18.3 (*)  Platelets 605 (*)    Neutro Abs 11.9 (*)    Lymphs Abs 0.6 (*)    All other components within normal limits  COMPREHENSIVE METABOLIC PANEL - Abnormal; Notable for the following components:   Potassium 6.1 (*)    Chloride 95 (*)    Glucose, Bld 114 (*)    BUN 45 (*)    Creatinine, Ser 1.71 (*)    Albumin 2.4 (*)    GFR, Estimated 46 (*)    All other components within normal limits  RESP PANEL BY RT-PCR (FLU A&B, COVID) ARPGX2                                                                                                                         EKG  EKG Interpretation  Date/Time:  Tuesday April 06 2021 00:01:39 EDT Ventricular Rate:  124 PR Interval:  153 QRS Duration: 89 QT Interval:  278 QTC Calculation: 400 R Axis:   81 Text Interpretation: Sinus tachycardia Probable left atrial enlargement Probable LVH with secondary repol abnrm Anterior ST elevation, probably due to LVH 12 Lead; [place] Since last tracing rate faster Confirmed by Addison Lank [phone]) on 04/06/2021 2:18:18 AM      Radiology DG Chest Port 1 View  Result Date: 04/06/2021 CLINICAL DATA:  Increased shortness of breath.  Colon cancer. EXAM: PORTABLE CHEST 1 VIEW COMPARISON:  CT chest 03/12/2021, chest x-ray 03/09/2021 FINDINGS: Right chest wall PleurX catheter in similar position. The heart size and mediastinal contours are unchanged and only partially visualized. Persistent slight right to left mediastinal shift. Interval increase in hazy left lower lobe opacity. No pulmonary edema. Interval increase in almost now complete opacification of the right hemithorax. Interval increase in a small to moderate left pleural effusion/disease.  No pneumothorax. No acute osseous abnormality. IMPRESSION: 1. Interval increase in almost now complete opacification of the right hemithorax. Findings consistent with known pleural thickening, pleural effusion, and pulmonary disease. 2. Interval increase in small to moderate left pleural effusion/disease. 3. Left PleurX catheter in similar position. Electronically Signed   By: Iven Finn M.D.   On: 04/06/2021 01:33    Pertinent labs & imaging results that were available during my care of the patient were reviewed by me and considered in my medical decision making (see chart for details).  Medications Ordered in ED Medications  azithromycin (ZITHROMAX) 500 mg in sodium chloride 0.9 % 250 mL IVPB (500 mg Intravenous New Bag/Given 04/06/21 0419)  morphine 4 MG/ML injection 4 mg (4 mg Intravenous Given 04/06/21 0338)  sodium chloride 0.9 % bolus 1,000 mL (1,000 mLs Intravenous New Bag/Given 04/06/21 0340)  cefTRIAXone (ROCEPHIN) 1 g in sodium chloride 0.9 % 100 mL IVPB (0 g Intravenous Stopped 04/06/21 0415)  Procedures .1-3 Lead EKG Interpretation Performed by: Fatima Blank, MD Authorized by: Fatima Blank, MD     Interpretation: abnormal     ECG rate:  121   ECG rate assessment: tachycardic     Rhythm: sinus tachycardia     Ectopy: none     Conduction: normal   .Critical Care Performed by: Fatima Blank, MD Authorized by: Fatima Blank, MD   Critical care provider statement:    Critical care time (minutes):  45   Critical care was necessary to treat or prevent imminent or life-threatening deterioration of the following conditions:  Respiratory failure   Critical care was time spent personally by me on the following activities:  Discussions with consultants, evaluation of patient's response to treatment, examination of patient,  ordering and performing treatments and interventions, ordering and review of laboratory studies, ordering and review of radiographic studies, pulse oximetry, re-evaluation of patient's condition, obtaining history from patient or surrogate and review of old charts   Care discussed with: admitting provider      (including critical care time)  Medical Decision Making / ED Course I have reviewed the nursing notes for this encounter and the patient's prior records (if available in EHR or on provided paperwork).   [name] was evaluated in Emergency Department on 04/06/2021 for the symptoms described in the history of present illness. He was evaluated in the context of the global COVID-19 pandemic, which necessitated consideration that the patient might be at risk for infection with the SARS-CoV-2 virus that causes COVID-19. Institutional protocols and algorithms that pertain to the evaluation of patients at risk for COVID-19 are in a state of rapid change based on information released by regulatory bodies including the CDC and federal and state organizations. These policies and algorithms were followed during the patient's care in the ED.  Patient here with shortness of breath.  Patient is requiring 6 L nasal cannula. Chest x-ray notable for worsening bilateral pleural effusions. Labs notable for leukocytosis. Stable renal function.  Patient started on empiric antibiotics for possible postobstructive pneumonia.  Patient requires admission given increased oxygen demand.       Final Clinical Impression(s) / ED Diagnoses Final diagnoses:  Hypoxia  Bilateral pleural effusion      This chart was dictated using voice recognition software.  Despite best efforts to proofread,  errors can occur which can change the documentation meaning.   Fatima Blank, MD 04/06/21 [phone]

## 2021-04-06 NOTE — Progress Notes (Signed)
IR was requested for thoracentesis.   Patient has right pleural PleurX catheter that was placed by thoracic surgery on 02/15/21, informed primary team to attempt to drain using the catheter first.   Team attempted to drain via stain right Pleurx catheter, however, they were only able to drow 175 mL in 16 minutes.   Discussed with Dr. Dwaine Gale, who recommends cardiothoracic surgery consult for possible tPA injection through the Pleurx catheter.  If tPA injection would not allow to drain more pleural effusion via existing Pleurx catheter, IR can attempt thoracentesis, however based on the CT finding, it is unlikely that IR will be able to drain anything per Dr. Dwaine Gale.  Will delete the thoracentesis order. Ordering provider notified.   Please call IR for questions and concerns regarding thoracentesis.   Armando Gang [place] PA-C 04/06/2021 3:21 PM

## 2021-04-06 NOTE — ED Triage Notes (Signed)
Pt complains of increasing short of breath today, he wears 2.5 liters of oxygen at home, O2 sats are low in triage

## 2021-04-07 ENCOUNTER — Inpatient Hospital Stay (HOSPITAL_COMMUNITY): Payer: BC Managed Care – PPO

## 2021-04-07 DIAGNOSIS — Z66 Do not resuscitate: Secondary | ICD-10-CM

## 2021-04-07 DIAGNOSIS — Z7189 Other specified counseling: Secondary | ICD-10-CM

## 2021-04-07 DIAGNOSIS — R0602 Shortness of breath: Secondary | ICD-10-CM

## 2021-04-07 DIAGNOSIS — R9431 Abnormal electrocardiogram [ECG] [EKG]: Secondary | ICD-10-CM

## 2021-04-07 DIAGNOSIS — Z515 Encounter for palliative care: Secondary | ICD-10-CM

## 2021-04-07 LAB — CBC WITH DIFFERENTIAL/PLATELET
Abs Immature Granulocytes: 0.06 10*3/uL (ref 0.00–0.07)
Basophils Absolute: 0 10*3/uL (ref 0.0–0.1)
Basophils Relative: 0 %
Eosinophils Absolute: 0 10*3/uL (ref 0.0–0.5)
Eosinophils Relative: 0 %
HCT: 34.4 % — ABNORMAL LOW (ref 39.0–52.0)
Hemoglobin: 10 g/dL — ABNORMAL LOW (ref 13.0–17.0)
Immature Granulocytes: 1 %
Lymphocytes Relative: 4 %
Lymphs Abs: 0.6 10*3/uL — ABNORMAL LOW (ref 0.7–4.0)
MCH: 24.3 pg — ABNORMAL LOW (ref 26.0–34.0)
MCHC: 29.1 g/dL — ABNORMAL LOW (ref 30.0–36.0)
MCV: 83.5 fL (ref 80.0–100.0)
Monocytes Absolute: 0.9 10*3/uL (ref 0.1–1.0)
Monocytes Relative: 7 %
Neutro Abs: 11.7 10*3/uL — ABNORMAL HIGH (ref 1.7–7.7)
Neutrophils Relative %: 88 %
Platelets: 537 10*3/uL — ABNORMAL HIGH (ref 150–400)
RBC: 4.12 MIL/uL — ABNORMAL LOW (ref 4.22–5.81)
RDW: 18 % — ABNORMAL HIGH (ref 11.5–15.5)
WBC: 13.2 10*3/uL — ABNORMAL HIGH (ref 4.0–10.5)
nRBC: 0 % (ref 0.0–0.2)

## 2021-04-07 LAB — COMPREHENSIVE METABOLIC PANEL
ALT: 26 U/L (ref 0–44)
AST: 24 U/L (ref 15–41)
Albumin: 2.3 g/dL — ABNORMAL LOW (ref 3.5–5.0)
Alkaline Phosphatase: 73 U/L (ref 38–126)
Anion gap: 14 (ref 5–15)
BUN: 52 mg/dL — ABNORMAL HIGH (ref 6–20)
CO2: 25 mmol/L (ref 22–32)
Calcium: 9.3 mg/dL (ref 8.9–10.3)
Chloride: 101 mmol/L (ref 98–111)
Creatinine, Ser: 1.72 mg/dL — ABNORMAL HIGH (ref 0.61–1.24)
GFR, Estimated: 46 mL/min — ABNORMAL LOW (ref >60)
Glucose, Bld: 93 mg/dL (ref 70–99)
Potassium: 6.7 mmol/L (ref 3.5–5.1)
Sodium: 140 mmol/L (ref 135–145)
Total Bilirubin: 0.6 mg/dL (ref 0.3–1.2)
Total Protein: 6.7 g/dL (ref 6.5–8.1)

## 2021-04-07 LAB — PHOSPHORUS: Phosphorus: 5 mg/dL — ABNORMAL HIGH (ref 2.5–4.6)

## 2021-04-07 LAB — ECHOCARDIOGRAM LIMITED
Height: 72 in
S' Lateral: 2.7 cm
Weight: 2433.88 oz

## 2021-04-07 LAB — POTASSIUM
Potassium: 6 mmol/L — ABNORMAL HIGH (ref 3.5–5.1)
Potassium: 6 mmol/L — ABNORMAL HIGH (ref 3.5–5.1)

## 2021-04-07 LAB — PROCALCITONIN: Procalcitonin: 2.06 ng/mL

## 2021-04-07 LAB — MAGNESIUM: Magnesium: 2.3 mg/dL (ref 1.7–2.4)

## 2021-04-07 MED ORDER — SODIUM CHLORIDE 0.9 % IV SOLN
INTRAVENOUS | Status: DC | PRN
  Administered 2021-04-07: 500 mL via INTRAVENOUS
  Administered 2021-04-08: 250 mL via INTRAVENOUS

## 2021-04-07 MED ORDER — GLYCOPYRROLATE 1 MG PO TABS: 1.0000 mg | ORAL_TABLET | ORAL | Status: DC | PRN

## 2021-04-07 MED ORDER — LORAZEPAM 1 MG PO TABS: 1.0000 mg | ORAL_TABLET | ORAL | Status: DC | PRN

## 2021-04-07 MED ORDER — LORAZEPAM 2 MG/ML IJ SOLN: 1.0000 mg | INTRAMUSCULAR | Status: DC | PRN

## 2021-04-07 MED ORDER — GLYCOPYRROLATE 0.2 MG/ML IJ SOLN: 0.2000 mg | INTRAMUSCULAR | Status: DC | PRN

## 2021-04-07 MED ORDER — HALOPERIDOL LACTATE 5 MG/ML IJ SOLN: 0.5000 mg | INTRAMUSCULAR | Status: DC | PRN

## 2021-04-07 MED ORDER — POLYVINYL ALCOHOL 1.4 % OP SOLN: 1.0000 [drp] | Freq: Four times a day (QID) | OPHTHALMIC | Status: DC | PRN

## 2021-04-07 MED ORDER — SODIUM ZIRCONIUM CYCLOSILICATE 10 G PO PACK
10.0000 g | PACK | Freq: Two times a day (BID) | ORAL | Status: DC
  Administered 2021-04-07: 10 g via ORAL
  Filled 2021-04-07: qty 1

## 2021-04-07 MED ORDER — CALCIUM GLUCONATE-NACL 1-0.675 GM/50ML-% IV SOLN
1.0000 g | Freq: Once | INTRAVENOUS | Status: AC
  Administered 2021-04-07: 1000 mg via INTRAVENOUS
  Filled 2021-04-07: qty 50

## 2021-04-07 MED ORDER — ENSURE ENLIVE PO LIQD
237.0000 mL | ORAL | Status: DC
  Administered 2021-04-07: 237 mL via ORAL

## 2021-04-07 MED ORDER — BIOTENE DRY MOUTH MT LIQD: 15.0000 mL | OROMUCOSAL | Status: DC | PRN

## 2021-04-07 MED ORDER — BOOST / RESOURCE BREEZE PO LIQD CUSTOM: 1.0000 | Freq: Two times a day (BID) | ORAL | Status: DC

## 2021-04-07 MED ORDER — MORPHINE SULFATE (PF) 2 MG/ML IV SOLN
2.0000 mg | Freq: Once | INTRAVENOUS | Status: AC
  Administered 2021-04-07: 2 mg via INTRAVENOUS
  Filled 2021-04-07: qty 1

## 2021-04-07 MED ORDER — HALOPERIDOL 1 MG PO TABS: 0.5000 mg | ORAL_TABLET | ORAL | Status: DC | PRN

## 2021-04-07 MED ORDER — DEXTROSE 50 % IV SOLN
1.0000 | Freq: Once | INTRAVENOUS | Status: AC
  Administered 2021-04-07: 50 mL via INTRAVENOUS
  Filled 2021-04-07: qty 50

## 2021-04-07 MED ORDER — HALOPERIDOL LACTATE 2 MG/ML PO CONC
0.5000 mg | ORAL | Status: DC | PRN
  Filled 2021-04-07: qty 0.3

## 2021-04-07 MED ORDER — SODIUM CHLORIDE 0.9 % IV SOLN
0.5000 mg/h | INTRAVENOUS | Status: DC
  Administered 2021-04-07: 0.5 mg/h via INTRAVENOUS
  Filled 2021-04-07: qty 5

## 2021-04-07 MED ORDER — HYDROMORPHONE BOLUS VIA INFUSION
0.5000 mg | INTRAVENOUS | Status: DC | PRN
  Filled 2021-04-07: qty 1

## 2021-04-07 MED ORDER — INSULIN ASPART 100 UNIT/ML IV SOLN
5.0000 [IU] | Freq: Once | INTRAVENOUS | Status: AC
  Administered 2021-04-07: 5 [IU] via INTRAVENOUS

## 2021-04-07 MED ORDER — HEPARIN SODIUM (PORCINE) 5000 UNIT/ML IJ SOLN
5000.0000 [IU] | Freq: Three times a day (TID) | INTRAMUSCULAR | Status: DC
  Administered 2021-04-07: 5000 [IU] via SUBCUTANEOUS
  Filled 2021-04-07: qty 1

## 2021-04-07 MED ORDER — ADULT MULTIVITAMIN W/MINERALS CH
1.0000 | ORAL_TABLET | Freq: Every day | ORAL | Status: DC
  Administered 2021-04-07: 1 via ORAL

## 2021-04-07 MED ORDER — SODIUM ZIRCONIUM CYCLOSILICATE 10 G PO PACK: 10.0000 g | PACK | Freq: Every day | ORAL | Status: DC

## 2021-04-07 MED ORDER — LORAZEPAM 2 MG/ML PO CONC
1.0000 mg | ORAL | Status: DC | PRN
  Filled 2021-04-07: qty 0.5

## 2021-04-07 MED ORDER — BIOTENE DRY MOUTH MT LIQD: 15.0000 mL | Freq: Two times a day (BID) | OROMUCOSAL | Status: DC

## 2021-04-07 NOTE — Progress Notes (Signed)
Palliative:  Consult received and chart reviewed. Unfortunately, when I met with patient he was unable to discuss goals of care -  Only grunts in response to my questions. His sister was only listed point of contact - I spoke with her and she tells me she is 1 of 3 siblings and she is not aware of any of his siblings being 79. She tells me patient is unmarried but does have 2 sons and they are next of kin. She provides contact info for one son: Ki Corbo [phone], I attempted to call Donell however went straight to voicemail. Voice message left with call back number.   Juel Burrow, DNP, AGNP-C Palliative Medicine Team Team Phone # [phone]  Pager # [phone]  NO CHARGE

## 2021-04-07 NOTE — Progress Notes (Signed)
Initial Nutrition Assessment  DOCUMENTATION CODES:   Not applicable  INTERVENTION:  - will order Boost Breeze BID, each supplement provides 250 kcal and 9 grams of protein. - will order Ensure Enlive once/day, each supplement provides 350 kcal and 20 grams of protein. - will order Magic Cup with dinner meals, each supplement provides 290 kcal and 9 grams of protein. - will order 1 tablet multivitamin with minerals/day. - complete NFPE when feasible.    NUTRITION DIAGNOSIS:   Inadequate oral intake related to acute illness,nausea as evidenced by per patient/family report.  GOAL:   Patient will meet greater than or equal to 90% of their needs  MONITOR:   PO intake,Supplement acceptance,Labs,Weight trends  REASON FOR ASSESSMENT:   Malnutrition Screening Tool  ASSESSMENT:   55 y.o. male with medical history significant for colon cancer, HTN, CKD, and metastatic renal cell carcinoma with pleural mass, pleural effusions, Pleurx catheter, and hypoxic respiratory failure. He presented to the ED with SOB x1 month. He lives with a sister and broth and has [place] visits.  No intakes documented since admission. Patient laying in bed, very nauseated, with two sisters at bedside who were attending to him. Provided patient with emesis bags.  Visit very brief. Sister with whom patient lives reports that for 5-6 days PTA patient would consistently say "later" when offered foods "but later never came". She reports that he was feeling very nauseated and did have a few episodes of emesis during those days.   Able to talk with RN who reports that she encouraged patient to take a bite of pancake for breakfast. He took 1 bite and drank 50% of a cup of orange juice for the meal. No other PO intakes so far today.   Weight yesterday was 152 lb and weight on 03/09/21 was documented as 170 lb. This indicates 18 lb weight loss (10.6% body weight) in 1 month. Unsure what amount (if any) is attributable  to Pleurx output.  Per notes: - metastatic renal cell carcinoma - pleural mass, pleural effusions, s/p Pleurx PTA - stage 3 CKD - to remain Full Code at this time   Labs reviewed; K: 6 mmol/l, BUN: 52 mg/dl, creatinine: 1.72, Phos: 5 mg/dl, GFR: 46 ml/min. Medications reviewed; 40 mg protonix/day, 10 g lokelma x1 dose 6/7 and x2 doses 6/8.      NUTRITION - FOCUSED PHYSICAL EXAM:  unable to complete d/t patient being very nauseated.   Diet Order:   Diet Order            DIET SOFT Room service appropriate? Yes; Fluid consistency: Thin  Diet effective now                 EDUCATION NEEDS:   Not appropriate for education at this time  Skin:  Skin Assessment: Reviewed RN Assessment  Last BM:  6/7 (type 6 x1)  Height:   Ht Readings from Last 1 Encounters:  04/06/21 6' (1.829 m)    Weight:   Wt Readings from Last 1 Encounters:  04/06/21 69 kg     Estimated Nutritional Needs:  Kcal:  2100-2300 kcal Protein:  105-120 grams Fluid:  >/= 2.5 L/day      Jarome Matin, MS, RD, LDN, CNSC Inpatient Clinical Dietitian RD pager # available in [facility]  After hours/weekend pager # available in [facility]

## 2021-04-07 NOTE — Consult Note (Signed)
Consultation Note Date: 04/07/2021   Patient Name: [name]  DOB: 10/21/1966  MRN: [id number]  Age / Sex: 55 y.o., male  PCP: Thomas Chroman, MD Referring Physician: Georgette Shell, MD  Reason for Consultation: Establishing goals of care  HPI/Patient Profile: 55 y.o. male  with past medical history of colon cancer, hypertension, chronic kidney disease, and metastatic renal cell carcinoma with pleural mass, pleural effusions, Pleurx catheter on the right, and hypoxic respiratory failure admitted on 04/19/2021 with worsening shortness of breath. Chest x-ray demonstrates interval increase in bilateral pleural effusion and now has near complete opacification of the right hemithorax. Also with persistent hyperkalemia. PMT consulted to discuss [name].  Clinical Assessment and Goals of Care: I have reviewed medical records including EPIC notes, labs and imaging, received report from RN, assessed the patient and then met with his family to discuss diagnosis prognosis, GOC, EOL wishes, disposition and options.  First attempted to discuss goals of care with patient - however he could not verbally interact. He only grunted in response to my questions. He was sitting up in bed in tripod position with drool coming out of his mouth. Remained on nasal cannula with saturations in low 90s. Tachycardic. When I asked him if I could call his family to discuss his care moving forward he nodded his head yes.   I first spoke with his only family member listed in chart - a sister. She reports that she is not aware of an HCPOA. She tells me there are 3 siblings. She tells me patient is not married but he does have 2 adult sons - we discuss that since they are next of kin they are decision makers - she supplies phone numbers for oldest son.   Able to get in touch with oldest son, [name].  I introduced Palliative Medicine as specialized medical care for people  living with serious illness. It focuses on providing relief from the symptoms and stress of a serious illness. The goal is to improve quality of life for both the patient and the family.  He tells me the patient was somewhat private about his medical conditions so he is not aware of the specifics of his care. He is aware of metastatic cancer and tells me he understands patient is very ill.    We discussed patient's current illness and what it means in the larger context of patient's on-going co-morbidities.  Natural disease trajectory and expectations at EOL were discussed. We discuss that patient continues to worsen despite aggressive medical treatment and we discuss concern that patient is nearing end of life.   I attempted to elicit values and goals of care important to the patient. The difference between aggressive medical intervention and comfort care was considered in light of the patient's goals of care.   Son requests time to speak to other family members that includes patient's siblings and elderly mother. He later called me back and shares they have all agreed to transition to full comfort measures. We discuss what this entails - using medications to provide relief of symptoms and not pursuing aggressive measures. He expresses understanding and agrees.   Son asks about hospice care - we discuss that we may consider a hospice facility depending on how patient does but for now I expect a hospital death. He expresses understanding.   Questions and concerns were addressed. The family was encouraged to call with questions or concerns.   Primary Decision Maker NEXT OF KIN - adult sons  SUMMARY OF RECOMMENDATIONS   - transition to comfort measures only - dilaudid infusion (avoid morphine in renal failure) and PRNs for agitation and excessive secretions - dc all measures not needed to ensure comfort  Code Status/Advance Care Planning:  DNR (discussed with [name] and  patient)  Additional Recommendations (Limitations, Scope, Preferences):  Full Comfort Care  Prognosis:   < 2 weeks  Discharge Planning: Anticipated Hospital Death      Primary Diagnoses: Present on Admission: . Acute on chronic respiratory failure with hypoxia ([name]) . Pleural effusion, malignant . Metastatic renal cell carcinoma ([name]) . Chronic kidney disease, stage 3a ([name])   I have reviewed the medical record, interviewed the patient and family, and examined the patient. The following aspects are pertinent.  Past Medical History:  Diagnosis Date  . Anemia   . Cancer [name])    colon cancer  . Colon cancer ([place])   . Dyspnea   . GIB (gastrointestinal bleeding)   . High cholesterol   . History of colon cancer 01/23/2021  . HTN (hypertension)    Social History   Socioeconomic History  . Marital status: Single    Spouse name: Not on file  . Number of children: Not on file  . Years of education: Not on file  . Highest education level: Not on file  Occupational History  . Not on file  Tobacco Use  . Smoking status: Former Smoker    Quit date: 2006    Years since quitting: 16.4  . Smokeless tobacco: Never Used  Vaping Use  . Vaping Use: Never used  Substance and Sexual Activity  . Alcohol use: No  . Drug use: No  . Sexual activity: Not Currently  Other Topics Concern  . Not on file  Social History Narrative  . Not on file   Social Determinants of Health   Financial Resource Strain: Not on file  Food Insecurity: Not on file  Transportation Needs: Not on file  Physical Activity: Not on file  Stress: Not on file  Social Connections: Not on file   Family History  Problem Relation Age of Onset  . Hypertension Mother   . Hypertension Sister   . Hypertension Brother   . Heart attack Cousin   . Cancer Father    Scheduled Meds: . Chlorhexidine Gluconate Cloth  6 each Topical Daily  . feeding supplement  1 Container Oral BID BM  . feeding supplement  237  mL Oral Q24H  . heparin injection (subcutaneous)  5,000 Units Subcutaneous Q8H  . mouth rinse  15 mL Mouth Rinse BID  . multivitamin with minerals  1 tablet Oral Daily  . pantoprazole  40 mg Oral Daily  . sodium zirconium cyclosilicate  10 g Oral BID   Continuous Infusions: . azithromycin Stopped (04/07/21 0535)  . cefTRIAXone (ROCEPHIN)  IV Stopped (04/07/21 0403)   PRN Meds:.acetaminophen **OR** acetaminophen, albuterol, morphine injection, ondansetron **OR** ondansetron (ZOFRAN) IV, senna-docusate No Known Allergies Review of Systems  Unable to perform ROS: Severe respiratory distress    Physical Exam Constitutional:      General: He is in acute distress.     Appearance: He is ill-appearing.     Comments: Lethargic, looks at me, grunts in response to questions, no verbal interaction  Cardiovascular:     Rate and Rhythm: Regular rhythm. Tachycardia present.  Pulmonary:     Effort: Tachypnea present.  Skin:    General: Skin is warm and dry.     Vital Signs:  BP (!) 103/50   Pulse (!) 125   Temp 98.2 F (36.8 C) (Axillary)   Resp 15   Ht 6' (1.829 m)   Wt 69 kg   SpO2 90%   BMI 20.63 kg/m  Pain Scale: 0-10   Pain Score: Asleep   SpO2: SpO2: 90 % O2 Device:SpO2: 90 % O2 Flow Rate: .O2 Flow Rate (L/min): 6 L/min  IO: Intake/output summary:   Intake/Output Summary (Last 24 hours) at 04/07/2021 1637 Last data filed at 04/07/2021 0650 Gross per 24 hour  Intake 308.7 ml  Output 300 ml  Net 8.7 ml    LBM:   Baseline Weight: Weight: 68 kg Most recent weight: Weight: 69 kg     Palliative Assessment/Data:    Time Total: 85 minutes Greater than 50%  of this time was spent counseling and coordinating care related to the above assessment and plan.  Juel Burrow, DNP, AGNP-C Palliative Medicine Team [phone] Pager: [phone]

## 2021-04-07 NOTE — Evaluation (Signed)
Clinical/Bedside Swallow Evaluation Patient Details  Name: [name] MRN: [id number] Date of Birth: 12/20/1965  Today's Date: 04/07/2021 Time: SLP Start Time (ACUTE ONLY): 1350 SLP Stop Time (ACUTE ONLY): 1411 SLP Time Calculation (min) (ACUTE ONLY): 21 min  Past Medical History:  Past Medical History:  Diagnosis Date  . Anemia   . Cancer [facility])    colon cancer  . Colon cancer ([place])   . Dyspnea   . GIB (gastrointestinal bleeding)   . High cholesterol   . History of colon cancer 01/23/2021  . HTN (hypertension)    Past Surgical History:  Past Surgical History:  Procedure Laterality Date  . CHEST TUBE INSERTION Right 02/15/2021   Procedure: INSERTION PLEURAL DRAINAGE CATHETER;  Surgeon: Melrose Nakayama, MD;  Location: [facility];  Service: Thoracic;  Laterality: Right;  . COLON RESECTION    . HEMICOLECTOMY Right   . HERNIA REPAIR    . IR THORACENTESIS ASP PLEURAL SPACE W/IMG GUIDE  01/25/2021   HPI:  55 yo male adm to [facility] with shortness of breath and hypoxia - due to metastatic renal cell carcinoma with pleural masses and effusions. Per notes, medical oncology recommended hospice but he declined.  Swallow eval ordered.  Prognosis is poor per notes.   Assessment / Plan / Recommendation Clinical Impression  Assessment limited by pt's tripod positionig with increased effort of breathing and pt ability to articulate approx 2-3 breathy words per breath group.  Oral retention of secretion noted with poor pt awareness with drooling.  Gross weakness apparent - SLP provided dental brushing with toothbrush/oral suction - clearing viscous secretions.  Offered to place pt's partials - however he declined.  Pt willing to consume one single ice chip and tsp of cold water only.  Decreased labial seal, prolonged oral transiting clinically present with delayed swallow and drooling.  Pt declined further intake despite SLP offerings.  Attempted to position in manner to open lungs with reverse  trendelenburg but pt respositioned back to tripod at end of session.  Pt admits to lack of desire for po intake and he remains full code with obvious dysphagia due to his gross weakness.  Recommend change to clear liquid diet via tsp and follow for improvement in function for dietary advancement readiness.  Informed pt who was agreeable to plan. SLP Visit Diagnosis: Dysphagia, oral phase (R13.11)    Aspiration Risk  Moderate aspiration risk    Diet Recommendation Thin liquid   Liquid Administration via: Spoon Medication Administration: Crushed with puree Supervision: Patient able to self feed Compensations: Slow rate;Small sips/bites (oral suction) Postural Changes: Seated upright at 90 degrees    Other  Recommendations Oral Care Recommendations: Oral care QID   Follow up Recommendations        Frequency and Duration min 1 x/week  1 week       Prognosis Prognosis for Safe Diet Advancement: Guarded Barriers to Reach Goals: Severity of deficits      Swallow Study   General Date of Onset: 04/07/21 HPI: 55 yo male adm to [facility] with shortness of breath and hypoxia - due to metastatic renal cell carcinoma with pleural masses and effusions. Per notes, medical oncology recommended hospice but he declined.  Swallow eval ordered.  Prognosis is poor per notes. Type of Study: Bedside Swallow Evaluation Diet Prior to this Study: Dysphagia 3 (soft);Thin liquids Respiratory Status: Nasal cannula History of Recent Intubation: No Behavior/Cognition: Alert;Doesn't follow directions Oral Cavity Assessment: Excessive secretions;Other (comment) Oral Care Completed by SLP: Yes  Oral Cavity - Dentition: Missing dentition Self-Feeding Abilities: Total assist Patient Positioning:  (tripod position) Baseline Vocal Quality: Low vocal intensity;Breathy;Suspected CN X (Vagus) involvement Volitional Cough: Weak;Congested Volitional Swallow: Able to elicit (delayed)    Oral/Motor/Sensory Function  Overall Oral Motor/Sensory Function: Generalized oral weakness (significant generalized weakness)   Ice Chips Ice chips: Impaired Presentation: Spoon Oral Phase Impairments: Poor awareness of bolus;Reduced labial seal;Reduced lingual movement/coordination Oral Phase Functional Implications: Oral holding;Right anterior spillage;Left anterior spillage Pharyngeal Phase Impairments: Suspected delayed Swallow;Decreased hyoid-laryngeal movement Other Comments: drooling   Thin Liquid Thin Liquid: Impaired Presentation: Spoon Oral Phase Impairments: Reduced lingual movement/coordination;Reduced labial seal;Poor awareness of bolus Oral Phase Functional Implications: Right anterior spillage;Left anterior spillage Pharyngeal  Phase Impairments: Suspected delayed Swallow Other Comments: drooling    Nectar Thick Nectar Thick Liquid: Not tested   Honey Thick Honey Thick Liquid: Not tested   Puree Puree: Not tested   Solid     Solid: Not tested      Macario Golds 04/07/2021,2:21 PM   Kathleen Lime, MS [facility] SLP [place] Office [phone] Pager [phone]

## 2021-04-07 NOTE — Progress Notes (Signed)
Referral from PG last night to [facility] for emotional support this AM.  Pt. minimally responsive to [facility] inquiries this AM, answering mostly 'yes' and 'no'; seemed agreeable and positively responsive to offer for prayer, but shared no other needs aside from experiencing pain in his back.  Chaplains remain available as needed via page.  Lindaann Pascal PRN Chaplain Pager: [phone]

## 2021-04-07 NOTE — Progress Notes (Signed)
PROGRESS NOTE    [name]  PYK:[id number] DOB: 1966/10/28 DOA: 04/11/2021 PCP: Glenda Chroman, MD   Brief Narrative: HPI per Dr. Myna Hidalgo on 04/06/2021 [name] is a 55 y.o. 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  Patient reports progressive shortness of breath over more than a month, but has been particularly dyspneic for the past few days and unable to tolerate any activity.  Reports that his Pleurx catheter continues to drain, has not noted any change in output, has not been coughing much, and has not noticed any changing his bilateral foot and ankle swelling.  Reports that he lives with his sister and brother and has home health visits.  Patient has developed difficulty catching his breath even at rest for the past day.   ED Course: Upon arrival to the ED, patient is found to be afebrile, saturating low 90s on 6 L/min of supplemental oxygen, tachypneic, tachycardic, and with stable blood pressure.  EKG features sinus tachycardia with rate 124.  Chest x-ray demonstrates interval increase in bilateral pleural effusion and now has near complete opacification of the right hemithorax.  He was given a liter of saline, morphine, Rocephin, and azithromycin in the ED.  Assessment & Plan:   Principal Problem:   Acute on chronic respiratory failure with hypoxia (HCC) Active Problems:   Metastatic renal cell carcinoma (HCC)   Pleural effusion, malignant   Bilateral pleural effusion   Chronic kidney disease, stage 3a (HCC)   #1 metastatic renal cell carcinoma with pleural masses pleural effusions and chronic right pleural catheter, which he has not used it for close to a year now admitted with shortness of breath hypoxia and  dyspnea. Currently on 6 L of oxygen to maintain his sats above 92%. Chest x-ray done today with persistent bilateral pleural effusions, nursing tried Pleurx catheter drainage with 175 cc of fluid removed from the Pleurx catheter on the right side. Prior attending physician discussed with CT surgery and interventional radiology regarding further thoracentesis. IR recommended likely to do tPA through his Pleurx catheter, CT surgery did not think this would help as the area has been consumed by tumor.  CT surgery did not have anything new to offer. CT chest 03/12/2021 marked interval worsening of pleural disease particularly in the right chest associated with small amounts of hemorrhage scattered throughout the loculated area in the superior aspect of the right chest, Pleurx catheter terminates in the upper to mid chest below this area, associated volume loss in the right chest, area of lymphangitic tumor spread versus postobstructive pneumonitis, signs of new and worsening hepatic metastatic disease, pericardial effusion. Check echo. I will consult palliative care. Attempted to reach his sister who apparently was visiting the patient in the hospital was not able to reach her. Code status dw patient with rn at bed side.he is aware he has metastatic malignancy and he is not going to make it.he agreed for dnr.  #2 CKD stage IIIa complicated with persistent hyperkalemia-increase Lokelma to twice daily, calcium gluconate, recheck potassium later today.  And labs in AM. Potassium 6.0 down from 6.7 earlier today.  #3 goals of care patient with bad prognosis, currently full code we will consult palliative care. Oncology following and agree with the same.  Nutrition Problem: Inadequate oral intake Etiology: acute illness,nausea  Signs/Symptoms: per patient/family report  Interventions: Ensure Enlive (each supplement provides 350kcal and 20 grams of protein),Magic cup,Boost Breeze,MVI  Estimated body mass  index is 20.63 kg/m as calculated from the following:   Height as of this encounter: 6' (1.829 m).   Weight as of this encounter: 69 kg.  DVT prophylaxis: Heparin  code Status: Full code  family Communication: None at bedside called sister with no response disposition Plan:  Status is: Inpatient  Dispo: The patient is from: Home              Anticipated d/c is to: Unknown              Patient currently is not medically stable   Difficult to place patient no    Consultants:   Interventional radiology and oncology  Procedures: Right Pleurx catheter drainage 04/06/2021 Antimicrobials: None  Subjective: In bed leaning forward on 6 L of oxygen no significant overnight events, he reports no pain  Objective: Vitals:   04/07/21 0400 04/07/21 0600 04/07/21 0725 04/07/21 1200  BP: 128/68 123/63    Pulse: (!) 117 (!) 117    Resp: 17 12    Temp: 97.8 F (36.6 C)  98 F (36.7 C) (!) 97.1 F (36.2 C)  TempSrc: Axillary  Oral Axillary  SpO2: 99% 95%    Weight:      Height:        Intake/Output Summary (Last 24 hours) at 04/07/2021 1241 Last data filed at 04/07/2021 0650 Gross per 24 hour  Intake 308.7 ml  Output 300 ml  Net 8.7 ml   Filed Weights   04/21/2021 2357 04/06/21 2029  Weight: 68 kg 69 kg    Examination:  General exam: Appears in distress Respiratory system: Diminished at the bases bilaterally to auscultation. Respiratory effort normal. Cardiovascular system: S1 & S2 heard, RRR. No JVD, murmurs, rubs, gallops or clicks. No pedal edema. Gastrointestinal system: Abdomen is nondistended, soft and nontender. No organomegaly or masses felt. Normal bowel sounds heard. Central nervous system: Alert and oriented. No focal neurological deficits. Extremities: Symmetric 5 x 5 power. Skin: No rashes, lesions or ulcers Psychiatry: Judgement and insight appear normal. Mood & affect appropriate.     Data Reviewed: I have personally reviewed following labs and imaging  studies  CBC: Recent Labs  Lab 04/06/21 0221 04/07/21 0259  WBC 13.3* 13.2*  NEUTROABS 11.9* 11.7*  HGB 10.2* 10.0*  HCT 35.0* 34.4*  MCV 81.8 83.5  PLT 605* 175*   Basic Metabolic Panel: Recent Labs  Lab 04/06/21 0312 04/06/21 0539 04/06/21 1415 04/06/21 2115 04/07/21 0259 04/07/21 0751  NA 137  --   --   --  140  --   K 6.1* 6.5* 6.3* 6.1* 6.7* 6.0*  CL 95*  --   --   --  101  --   CO2 30  --   --   --  25  --   GLUCOSE 114*  --   --   --  93  --   BUN 45*  --   --   --  52*  --   CREATININE 1.71*  --   --   --  1.72*  --   CALCIUM 9.2  --   --   --  9.3  --   MG  --   --   --   --  2.3  --   PHOS  --   --   --   --  5.0*  --    GFR: Estimated Creatinine Clearance: 46.8 mL/min (A) (by C-G formula based on SCr of 1.72 mg/dL (H)). Liver Function Tests: Recent Labs  Lab 04/06/21 0312 04/07/21 0259  AST 28 24  ALT 30 26  ALKPHOS 72 73  BILITOT 0.7 0.6  PROT 6.8 6.7  ALBUMIN 2.4* 2.3*   No results for input(s): LIPASE, AMYLASE in the last 168 hours. No results for input(s): AMMONIA in the last 168 hours. Coagulation Profile: No results for input(s): INR, PROTIME in the last 168 hours. Cardiac Enzymes: No results for input(s): CKTOTAL, CKMB, CKMBINDEX, TROPONINI in the last 168 hours. BNP (last 3 results) No results for input(s): PROBNP in the last 8760 hours. HbA1C: No results for input(s): HGBA1C in the last 72 hours. CBG: No results for input(s): GLUCAP in the last 168 hours. Lipid Profile: No results for input(s): CHOL, HDL, LDLCALC, TRIG, CHOLHDL, LDLDIRECT in the last 72 hours. Thyroid Function Tests: No results for input(s): TSH, T4TOTAL, FREET4, T3FREE, THYROIDAB in the last 72 hours. Anemia Panel: No results for input(s): VITAMINB12, FOLATE, FERRITIN, TIBC, IRON, RETICCTPCT in the last 72 hours. Sepsis Labs: Recent Labs  Lab 04/06/21 0312 04/07/21 0348  PROCALCITON 1.33 2.06    Recent Results (from the past 240 hour(s))  Resp Panel by  RT-PCR (Flu A&B, Covid) Nasopharyngeal Swab     Status: None   Collection Time: 04/06/21  2:21 AM   Specimen: Nasopharyngeal Swab; Nasopharyngeal(NP) swabs in vial transport medium  Result Value Ref Range Status   SARS Coronavirus 2 by RT PCR NEGATIVE NEGATIVE Final    Comment: (NOTE) SARS-CoV-2 target nucleic acids are NOT DETECTED.  The SARS-CoV-2 RNA is generally detectable in upper respiratory specimens during the acute phase of infection. The lowest concentration of SARS-CoV-2 viral copies this assay can detect is 138 copies/mL. A negative result does not preclude SARS-Cov-2 infection and should not be used as the sole basis for treatment or other patient management decisions. A negative result may occur with  improper specimen collection/handling, submission of specimen other than nasopharyngeal swab, presence of viral mutation(s) within the areas targeted by this assay, and inadequate number of viral copies(<138 copies/mL). A negative result must be combined with clinical observations, patient history, and epidemiological information. The expected result is Negative.  Fact Sheet for Patients:  EntrepreneurPulse.com.au  Fact Sheet for Healthcare Providers:  IncredibleEmployment.be  This test is no t yet approved or cleared by the [place] FDA and  has been authorized for detection and/or diagnosis of SARS-CoV-2 by FDA under an Emergency Use Authorization (EUA). This EUA will remain  in effect (meaning this test can be used) for the duration of the COVID-19 declaration under Section 564(b)(1) of the Act, 21 U.S.C.section 360bbb-3(b)(1), unless the authorization is terminated  or revoked sooner.       Influenza A by PCR NEGATIVE NEGATIVE Final   Influenza B by PCR NEGATIVE NEGATIVE Final    Comment: (NOTE) The Xpert Xpress SARS-CoV-2/FLU/RSV plus assay is intended as an aid in the diagnosis of influenza from Nasopharyngeal swab  specimens and should not be used as a sole basis for treatment. Nasal washings and aspirates are unacceptable for Xpert Xpress SARS-CoV-2/FLU/RSV testing.  Fact Sheet for Patients: EntrepreneurPulse.com.au  Fact Sheet for Healthcare Providers: IncredibleEmployment.be  This test is not yet approved or cleared by the [place] FDA and has been authorized for detection and/or diagnosis of SARS-CoV-2 by FDA under an Emergency Use Authorization (EUA). This EUA will remain  in effect (meaning this test can be used) for the duration of the COVID-19 declaration under Section 564(b)(1) of the Act, 21 U.S.C. section 360bbb-3(b)(1), unless the authorization is terminated or revoked.  Performed at [facility], [place] [address] [place], [place] [address]   MRSA PCR Screening     Status: None   Collection Time: 04/06/21  8:22 PM   Specimen: Nasopharyngeal  Result Value Ref Range Status   MRSA by PCR NEGATIVE NEGATIVE Final    Comment:        The GeneXpert MRSA Assay (FDA approved for NASAL specimens only), is one component of a comprehensive MRSA colonization surveillance program. It is not intended to diagnose MRSA infection nor to guide or monitor treatment for MRSA infections. Performed at [facility], [place] [address], [place], [place] [address]          Radiology Studies: DG CHEST PORT 1 VIEW  Result Date: 04/07/2021 CLINICAL DATA:  Shortness of breath.  Hypoxia. EXAM: PORTABLE CHEST 1 VIEW COMPARISON:  04/06/2021. FINDINGS: Right PleurX catheter in stable position. No pneumothorax. Cardiomegaly. Bilateral pulmonary infiltrates/edema again noted. Persistent large bilateral pleural effusions particularly on the right again noted. No interim change. IMPRESSION: 1.  Right PleurX catheter in stable position.  No pneumothorax. 2.  Cardiomegaly. 3. Bilateral pulmonary infiltrates/edema again noted. Persistent  large bilateral pleural effusions particularly on the right again noted. No interim change Electronically Signed   By: Marcello Moores  Register   On: 04/07/2021 06:30   DG Chest Port 1 View  Result Date: 04/06/2021 CLINICAL DATA:  Increased shortness of breath.  Colon cancer. EXAM: PORTABLE CHEST 1 VIEW COMPARISON:  CT chest 03/12/2021, chest x-ray 03/09/2021 FINDINGS: Right chest wall PleurX catheter in similar position. The heart size and mediastinal contours are unchanged and only partially visualized. Persistent slight right to left mediastinal shift. Interval increase in hazy left lower lobe opacity. No pulmonary edema. Interval increase in almost now complete opacification of the right hemithorax. Interval increase in a small to moderate left pleural effusion/disease. No pneumothorax. No acute osseous abnormality. IMPRESSION: 1. Interval increase in almost now complete opacification of the right hemithorax. Findings consistent with known pleural thickening, pleural effusion, and pulmonary disease. 2. Interval increase in small to moderate left pleural effusion/disease. 3. Left PleurX catheter in similar position. Electronically Signed   By: Iven Finn M.D.   On: 04/06/2021 01:33        Scheduled Meds: . Chlorhexidine Gluconate Cloth  6 each Topical Daily  . feeding supplement  1 Container Oral BID BM  . feeding supplement  237 mL Oral Q24H  . mouth rinse  15 mL Mouth Rinse BID  . multivitamin with minerals  1 tablet Oral Daily  . pantoprazole  40 mg Oral Daily  . sodium zirconium cyclosilicate  10 g Oral BID   Continuous Infusions: . azithromycin Stopped (04/07/21 0535)  . cefTRIAXone (ROCEPHIN)  IV Stopped (04/07/21 0403)     LOS: 1 day   Georgette Shell, MD 04/07/2021, 12:41 PM

## 2021-04-27 ENCOUNTER — Ambulatory Visit: Payer: BC Managed Care – PPO | Admitting: Thoracic Surgery (Cardiothoracic Vascular Surgery)

## 2021-04-30 NOTE — Progress Notes (Signed)
05/03/2021 Patient expired at 0443 on 2021-05-03 witnessed by this RN and Julio Alm RN, no breath sounds or apical pulse auscultated after 60 seconds each. Patient family member at bedside at time of death. This was an expected death, patient was placed on comfort care 04/07/2021 and all measures were taken to ensure patient's comfort. [facility] donor services notified per protocol. On call physician for [facility], Argie Ramming MD, notified of patient's death via [place] page at [place]. Cindy S. Brigitte Pulse BSN, RN, [place] 2021/05/03 5:30 AM

## 2021-04-30 NOTE — TOC Initial Note (Signed)
Transition of Care (TOC) - Initial/Assessment Note    Patient Details  Name: [name] MRN: [id number] Date of Birth: 1966/04/06  Transition of Care [facility]) CM/SW Contact:    Leeroy Cha, RN Phone Number: 2021-04-19, 9:46 AM  Clinical Narrative:                 Patient expired this am.  Expected Discharge Plan: Expired Barriers to Discharge: No Barriers Identified   Patient Goals and CMS Choice        Expected Discharge Plan and Services Expected Discharge Plan: Expired                                              Prior Living Arrangements/Services                       Activities of Daily Living Home Assistive Devices/Equipment: Dentures (specify type), Eyeglasses, Oxygen, Scales, Other (Comment) (pleurx catheter, upper/lower dentures) ADL Screening (condition at time of admission) Patient's cognitive ability adequate to safely complete daily activities?: Yes Is the patient deaf or have difficulty hearing?: No Does the patient have difficulty seeing, even when wearing glasses/contacts?: No Does the patient have difficulty concentrating, remembering, or making decisions?: No Patient able to express need for assistance with ADLs?: Yes Does the patient have difficulty dressing or bathing?: Yes (secondary to shortness of breath) Independently performs ADLs?: No Communication: Independent Dressing (OT): Needs assistance Is this a change from baseline?: Change from baseline, expected to last >3 days Grooming: Independent Feeding: Needs assistance Is this a change from baseline?: Change from baseline, expected to last >3 days Bathing: Needs assistance Is this a change from baseline?: Change from baseline, expected to last >3 days Toileting: Needs assistance Is this a change from baseline?: Change from baseline, expected to last >3days In/Out Bed: Needs assistance Is this a change from baseline?: Change from baseline, expected to last >3  days Walks in Home: Needs assistance Is this a change from baseline?: Change from baseline, expected to last >3 days Does the patient have difficulty walking or climbing stairs?: Yes (secondary to weakness and shortness of breath) Weakness of Legs: Both Weakness of Arms/Hands: None  Permission Sought/Granted                  Emotional Assessment              Admission diagnosis:  SOB (shortness of breath) [R06.02] Pleural effusion [J90] Hypoxia [R09.02] Bilateral pleural effusion [J90] Acute and chronic respiratory failure with hypoxia (HCC) [J96.21] Acute on chronic respiratory failure with hypoxia ([place]) [J96.21] Patient Active Problem List   Diagnosis Date Noted   Acute on chronic respiratory failure with hypoxia ([place]) 04/06/2021   Bilateral pleural effusion 04/06/2021   Chronic kidney disease, stage 3a ([place]) 04/06/2021   Goals of care, counseling/discussion 03/16/2021   Normocytic normochromic anemia 03/02/2021   Pleural effusion, malignant 01/28/2021   Unintentional weight loss of more than 10 pounds 01/28/2021   Pleural effusion on right 01/23/2021   History of colon cancer 01/23/2021   Essential hypertension 01/23/2021   Metastatic renal cell carcinoma ([place]) 01/23/2021   Acute respiratory failure with hypoxia ([place]) 01/23/2021   Pericardial effusion 01/23/2021   PCP:  Glenda Chroman, MD Pharmacy:   [id number] - [place], [address] [place] [address]  Phone: [phone] Fax: [place], [place]. [place] [address] Phone: [phone] Fax: [place] [address] Phone: [phone] Fax: [phone]  [place], [place] [address] Phone: [phone] Fax:  [phone]     Social Determinants of Health (SDOH) Interventions    Readmission Risk Interventions No flowsheet data found.

## 2021-04-30 NOTE — Progress Notes (Signed)
May 03, 2021 Dilaudid infusion stopped at time of death. Eighty-five milliliters wasted per protocol in [place] per protocol, witnessed by Julio Alm RN. Cindy S. Brigitte Pulse BSN, RN, CCRP 05/03/21 5:35 AM

## 2021-04-30 NOTE — Death Summary Note (Signed)
Death Summary  [name] OEU:[id number] DOB: December 24, 1965 DOA: 16-Apr-2021  PCP: Glenda Chroman, MD  Admit date: Apr 16, 2021 Date of Death: 04-19-2021 Time of Death: 02/27/42 am Notification: Glenda Chroman, MD notified of death of 2021-04-19   History of present illness:  [name] is a 55 y.o. 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  175 cc of fluid was removed from the right Pleurx catheter.  However her chest x-ray showed persistent bilateral pleural effusions and almost complete whiteout of the right thorax. Prior attending physician discussed with CT surgery and interventional radiology regarding further thoracentesis. IR recommended likely to do tPA through his Pleurx catheter, CT surgery did not think this would help as the area has been consumed by tumor.  CT surgery did not have anything new to offer.  CT chest 03/12/2021 marked interval worsening of pleural disease particularly in the right chest associated with small amounts of hemorrhage scattered throughout the loculated area in the superior aspect of the right chest, Pleurx catheter terminates in the upper to mid chest below this area, associated volume loss in the right chest, area of lymphangitic tumor spread versus postobstructive pneumonitis, signs of new and worsening hepatic metastatic disease, pericardial effusion.  Palliative care was consulted.  Patient's overall prognosis was poor.  His CODE STATUS was changed to DNR after discussion with the patient and family.  He was eventually placed on comfort care and the patient passed away peacefully at 4:43 AM on 04/19/2021. Final Diagnoses:  1.   Metastatic renal cell carcinoma 2 bilateral malignant pleural effusions 3 CKD stage IIIa 4 acute hypoxic  respiratory failure secondary to bilateral malignant pleural effusions and metastatic renal cell carcinoma with mets to the Pleura and pleural masses.   The results of significant diagnostics from this hospitalization (including imaging, microbiology, ancillary and laboratory) are listed below for reference.    Significant Diagnostic Studies: CT CHEST WO CONTRAST  Result Date: 03/12/2021 CLINICAL DATA:  Abnormal fall chest x-ray with pleural fluid and masslike areas in the chest. EXAM: CT CHEST WITHOUT CONTRAST TECHNIQUE: Multidetector CT imaging of the chest was performed following the standard protocol without IV contrast. COMPARISON:  Chest x-ray of Mar 09, 2021 and prior imaging of the abdomen and pelvis from March of 2022 as well as biopsy images from 02/27/2021. FINDINGS: Cardiovascular: Shift of heart mediastinal structures from RIGHT to LEFT, see below. Pericardial effusion with similar volume. Pericardial nodularity in adjacent adenopathy increased. Mediastinum/Nodes: Thoracic inlet structures are normal. No axillary lymphadenopathy. Bulky subcarinal adenopathy measuring as much as 4.0 x 6.1 cm, previously 3.2 cm short axis. AP window adenopathy increased (image 65/2) this could be a mixture of AP window lymph nodes and adjacent pleural disease but measuring approximately 2.5 cm greatest thickness as compared to 2 cm on the previous abdominal CT. Lungs/Pleura: Heart is shifted into the LEFT chest due to worsening pleural disease and enlarging effusion in the RIGHT chest. Loculated appearance of fluid and mixed density in the RIGHT upper chest corresponds to the chest x-ray abnormality of convex protrusions along the upper margin of the PleurX catheter. The PleurX catheter is seen in the posterior RIGHT chest. This is situated more in the anterior RIGHT chest in fills the RIGHT lung apex. Pleural thickening in the  RIGHT lower chest is markedly increased compared to previous imaging of just 2  months ago, now circumferential throughout the RIGHT chest. In the anterior chest in the cardiophrenic recess there is near confluent disease that tracks along the pleural space peripherally and involves collapsed lung in pleural surfaces including the major fissure tracking into the RIGHT lung apex. This measures in total approximately 8.3 x 8.6 cm greatest axial dimension previously individual nodules in this area while not insignificant ranged in size from 1 cm to 2 cm and in total while not confluent on the prior study measuring approximately 5.4 x 3.4 cm in axial dimension. Shift of mediastinum may be slightly increased but is present on the prior study as well. In addition to circumferential soft tissue with increase in the RIGHT chest there is increasing pleural fluid in the LEFT chest with signs of pleural involvement (image 28/2) 1.8 cm pleural base nodule. (Image 61/2 1.8 x 1.1 cm pleural base nodule. Similar size nodule in the anterolateral LEFT upper chest. Diminished aeration of lung in the RIGHT chest compared to previous imaging. Septal thickening and ground-glass in the RIGHT middle lobe. Minimal aeration of the RIGHT lower lobe. Volume loss also in the RIGHT upper lobe due to worsening disease Pleural nodularity the extends along the fissure in the LEFT chest as well. Major airways are patent with narrowing of peripheral airways in the RIGHT chest. Upper Abdomen: Large LEFT renal mass not well evaluated better seen on recent CT. Bulky para-aortic adenopathy and renal venous extension also better evaluated on recent CT. Expansion of the LEFT renal vein may be increased is a crosses the midline, difficult to assess. Increasing size of hepatic metastatic lesions and likely with new hepatic metastatic disease. On image 144 of series 2 measuring 5 x 2.3 cm this previously measured approximately 2.9 x 2.0 cm. New lesions are suspected with numerous low-attenuation foci now seen throughout the liver. For  instance on image 178 of series 2 is a 16 mm lesion not seen on the previous study. Musculoskeletal: No acute musculoskeletal process. No destructive bone finding IMPRESSION: 1. Marked interval worsening of pleural disease particularly in the RIGHT chest, likely associated with small amounts of hemorrhage scattered throughout a loculated area in the superior aspect of the RIGHT chest accounting for the convex abnormality seen on chest x-ray above the PleurX catheter. 2. PleurX catheter terminates in the upper to mid chest below this area likely tracking along the major fissure though difficult to determine given the extent of pleural disease. 3. Associated volume loss in the RIGHT chest as described. Area of either lymphangitic tumor spread in the RIGHT middle lobe versus postobstructive pneumonitis. 4. Signs of new and worsening hepatic metastatic disease. 5. Similar volume of pericardial effusion. These results will be called to the ordering clinician or representative by the Radiologist Assistant, and communication documented in the PACS or [employer]. Electronically Signed   By: Zetta Bills M.D.   On: 03/12/2021 14:18   DG CHEST PORT 1 VIEW  Result Date: 04/07/2021 CLINICAL DATA:  Shortness of breath.  Hypoxia. EXAM: PORTABLE CHEST 1 VIEW COMPARISON:  04/06/2021. FINDINGS: Right PleurX catheter in stable position. No pneumothorax. Cardiomegaly. Bilateral pulmonary infiltrates/edema again noted. Persistent large bilateral pleural effusions particularly on the right again noted. No interim change. IMPRESSION: 1.  Right PleurX catheter in stable position.  No pneumothorax. 2.  Cardiomegaly. 3. Bilateral pulmonary infiltrates/edema again noted. Persistent large bilateral pleural effusions particularly on the right again noted. No  interim change Electronically Signed   By: Marcello Moores  Register   On: 04/07/2021 06:30   DG Chest Port 1 View  Result Date: 04/06/2021 CLINICAL DATA:  Increased shortness of  breath.  Colon cancer. EXAM: PORTABLE CHEST 1 VIEW COMPARISON:  CT chest 03/12/2021, chest x-ray 03/09/2021 FINDINGS: Right chest wall PleurX catheter in similar position. The heart size and mediastinal contours are unchanged and only partially visualized. Persistent slight right to left mediastinal shift. Interval increase in hazy left lower lobe opacity. No pulmonary edema. Interval increase in almost now complete opacification of the right hemithorax. Interval increase in a small to moderate left pleural effusion/disease. No pneumothorax. No acute osseous abnormality. IMPRESSION: 1. Interval increase in almost now complete opacification of the right hemithorax. Findings consistent with known pleural thickening, pleural effusion, and pulmonary disease. 2. Interval increase in small to moderate left pleural effusion/disease. 3. Left PleurX catheter in similar position. Electronically Signed   By: Iven Finn M.D.   On: 04/06/2021 01:33   ECHOCARDIOGRAM LIMITED  Result Date: 04/07/2021    ECHOCARDIOGRAM LIMITED REPORT   Patient Name:   SIGISMUND CROSS Date of Exam: 04/07/2021 Medical Rec #:  [id number]       Height:       72.0 in Accession #:    [id number]      Weight:       152.1 lb Date of Birth:  1966/01/05       BSA:          1.897 m Patient Age:    66 years        BP:           123/63 mmHg Patient Gender: M               HR:           124 bpm. Exam Location:  Inpatient Procedure: 2D Echo, Cardiac Doppler and Color Doppler Indications:    Abnormal EKG  History:        Patient has prior history of Echocardiogram examinations, most                 recent 01/24/2021. Risk Factors:Hypertension.  Sonographer:    Cammy Brochure Referring Phys: [id number] [place] Comments: Image acquisition challenging due to uncooperative patient and patient unable to tolerate exam. IMPRESSIONS  1. Left ventricular ejection fraction, by estimation, is 70 to 75%. The left ventricle has hyperdynamic function.  The left ventricle has no regional wall motion abnormalities. There is moderate left ventricular hypertrophy.  2. Right ventricular systolic function is normal. The right ventricular size is normal.  3. Large pleural effusion.  4. The mitral valve is normal in structure. No evidence of mitral valve regurgitation. No evidence of mitral stenosis.  5. Tricuspid valve regurgitation is moderate.  6. The aortic valve is normal in structure. Aortic valve regurgitation is not visualized. No aortic stenosis is present.  7. Aortic dilatation noted. There is mild dilatation of the aortic root, measuring 44 mm. There is mild dilatation of the ascending aorta, measuring 40 mm.  8. The inferior vena cava is normal in size with greater than 50% respiratory variability, suggesting right atrial pressure of 3 mmHg. FINDINGS  Left Ventricle: Left ventricular ejection fraction, by estimation, is 70 to 75%. The left ventricle has hyperdynamic function. The left ventricle has no regional wall motion abnormalities. The left ventricular internal cavity size was normal in size. There is moderate left ventricular hypertrophy. Right Ventricle:  The right ventricular size is normal. No increase in right ventricular wall thickness. Right ventricular systolic function is normal. Left Atrium: Left atrial size was normal in size. Right Atrium: Right atrial size was normal in size. Pericardium: There is no evidence of pericardial effusion. Mitral Valve: The mitral valve is normal in structure. No evidence of mitral valve stenosis. Tricuspid Valve: The tricuspid valve is normal in structure. Tricuspid valve regurgitation is moderate . No evidence of tricuspid stenosis. Aortic Valve: The aortic valve is normal in structure. Aortic valve regurgitation is not visualized. No aortic stenosis is present. Pulmonic Valve: The pulmonic valve was normal in structure. Pulmonic valve regurgitation is not visualized. No evidence of pulmonic stenosis. Aorta:  Aortic dilatation noted. There is mild dilatation of the aortic root, measuring 44 mm. There is mild dilatation of the ascending aorta, measuring 40 mm. Venous: The inferior vena cava is normal in size with greater than 50% respiratory variability, suggesting right atrial pressure of 3 mmHg. IAS/Shunts: No atrial level shunt detected by color flow Doppler. Additional Comments: There is a large pleural effusion. LEFT VENTRICLE PLAX 2D LVIDd:         4.20 cm LVIDs:         2.70 cm LV PW:         1.40 cm LV IVS:        1.00 cm LVOT diam:     2.20 cm LVOT Area:     3.80 cm  LEFT ATRIUM         Index LA diam:    2.00 cm 1.05 cm/m   AORTA Ao Root diam: 4.40 cm  SHUNTS Systemic Diam: 2.20 cm Candee Furbish MD Electronically signed by Candee Furbish MD Signature Date/Time: 04/07/2021/3:18:38 PM    Final     Microbiology: Recent Results (from the past 240 hour(s))  Resp Panel by RT-PCR (Flu A&B, Covid) Nasopharyngeal Swab     Status: None   Collection Time: 04/06/21  2:21 AM   Specimen: Nasopharyngeal Swab; Nasopharyngeal(NP) swabs in vial transport medium  Result Value Ref Range Status   SARS Coronavirus 2 by RT PCR NEGATIVE NEGATIVE Final    Comment: (NOTE) SARS-CoV-2 target nucleic acids are NOT DETECTED.  The SARS-CoV-2 RNA is generally detectable in upper respiratory specimens during the acute phase of infection. The lowest concentration of SARS-CoV-2 viral copies this assay can detect is 138 copies/mL. A negative result does not preclude SARS-Cov-2 infection and should not be used as the sole basis for treatment or other patient management decisions. A negative result may occur with  improper specimen collection/handling, submission of specimen other than nasopharyngeal swab, presence of viral mutation(s) within the areas targeted by this assay, and inadequate number of viral copies(<138 copies/mL). A negative result must be combined with clinical observations, patient history, and  epidemiological information. The expected result is Negative.  Fact Sheet for Patients:  EntrepreneurPulse.com.au  Fact Sheet for Healthcare Providers:  IncredibleEmployment.be  This test is no t yet approved or cleared by the [place] FDA and  has been authorized for detection and/or diagnosis of SARS-CoV-2 by FDA under an Emergency Use Authorization (EUA). This EUA will remain  in effect (meaning this test can be used) for the duration of the COVID-19 declaration under Section 564(b)(1) of the Act, 21 U.S.C.section 360bbb-3(b)(1), unless the authorization is terminated  or revoked sooner.       Influenza A by PCR NEGATIVE NEGATIVE Final   Influenza B by PCR NEGATIVE NEGATIVE Final  Comment: (NOTE) The Xpert Xpress SARS-CoV-2/FLU/RSV plus assay is intended as an aid in the diagnosis of influenza from Nasopharyngeal swab specimens and should not be used as a sole basis for treatment. Nasal washings and aspirates are unacceptable for Xpert Xpress SARS-CoV-2/FLU/RSV testing.  Fact Sheet for Patients: EntrepreneurPulse.com.au  Fact Sheet for Healthcare Providers: IncredibleEmployment.be  This test is not yet approved or cleared by the [place] FDA and has been authorized for detection and/or diagnosis of SARS-CoV-2 by FDA under an Emergency Use Authorization (EUA). This EUA will remain in effect (meaning this test can be used) for the duration of the COVID-19 declaration under Section 564(b)(1) of the Act, 21 U.S.C. section 360bbb-3(b)(1), unless the authorization is terminated or revoked.  Performed at [facility], [place] [address] [place], [place] [address]   MRSA PCR Screening     Status: None   Collection Time: 04/06/21  8:22 PM   Specimen: Nasopharyngeal  Result Value Ref Range Status   MRSA by PCR NEGATIVE NEGATIVE Final    Comment:        The GeneXpert MRSA Assay  (FDA approved for NASAL specimens only), is one component of a comprehensive MRSA colonization surveillance program. It is not intended to diagnose MRSA infection nor to guide or monitor treatment for MRSA infections. Performed at [facility], [place] [address], [place], [place] [address]      Labs: Basic Metabolic Panel: Recent Labs  Lab 04/06/21 0312 04/06/21 0539 04/07/21 0259 04/07/21 0751 04/07/21 1337  NA 137  --  140  --   --   K 6.1*   < > 6.7* 6.0* 6.0*  CL 95*  --  101  --   --   CO2 30  --  25  --   --   GLUCOSE 114*  --  93  --   --   BUN 45*  --  52*  --   --   CREATININE 1.71*  --  1.72*  --   --   CALCIUM 9.2  --  9.3  --   --   MG  --   --  2.3  --   --   PHOS  --   --  5.0*  --   --    < > = values in this interval not displayed.   Liver Function Tests: Recent Labs  Lab 04/06/21 0312 04/07/21 0259  AST 28 24  ALT 30 26  ALKPHOS 72 73  BILITOT 0.7 0.6  PROT 6.8 6.7  ALBUMIN 2.4* 2.3*   No results for input(s): LIPASE, AMYLASE in the last 168 hours. No results for input(s): AMMONIA in the last 168 hours. CBC: Recent Labs  Lab 04/06/21 0221 04/07/21 0259  WBC 13.3* 13.2*  NEUTROABS 11.9* 11.7*  HGB 10.2* 10.0*  HCT 35.0* 34.4*  MCV 81.8 83.5  PLT 605* 537*   Cardiac Enzymes: No results for input(s): CKTOTAL, CKMB, CKMBINDEX, TROPONINI in the last 168 hours. D-Dimer No results for input(s): DDIMER in the last 72 hours. BNP: Invalid input(s): POCBNP CBG: No results for input(s): GLUCAP in the last 168 hours. Anemia work up No results for input(s): VITAMINB12, FOLATE, FERRITIN, TIBC, IRON, RETICCTPCT in the last 72 hours. Urinalysis    Component Value Date/Time   COLORURINE COLORLESS (A) 01/23/2021 1434   APPEARANCEUR CLEAR 01/23/2021 1434   LABSPEC 1.003 (L) 01/23/2021 1434   PHURINE 6.0 01/23/2021 1434   GLUCOSEU NEGATIVE 01/23/2021 1434   HGBUR MODERATE (  A) 01/23/2021 1434   BILIRUBINUR NEGATIVE 01/23/2021  1434   KETONESUR NEGATIVE 01/23/2021 1434   PROTEINUR NEGATIVE 01/23/2021 1434   NITRITE NEGATIVE 01/23/2021 1434   LEUKOCYTESUR NEGATIVE 01/23/2021 1434   Sepsis Labs Invalid input(s): PROCALCITONIN,  WBC,  LACTICIDVEN     SIGNED:  Georgette Shell, MD  [facility] 30-Apr-2021, 4:31 PM

## 2021-04-30 DEATH — deceased

## 2021-12-27 IMAGING — DX DG CHEST 2V
2 series · 2 of 2 positions shown · non-contrast
Comparison: February 12, 2021

CLINICAL DATA: PleurX catheter placement

EXAM:
CHEST - 2 VIEW

[dg chest 2 view (1 of 2)]
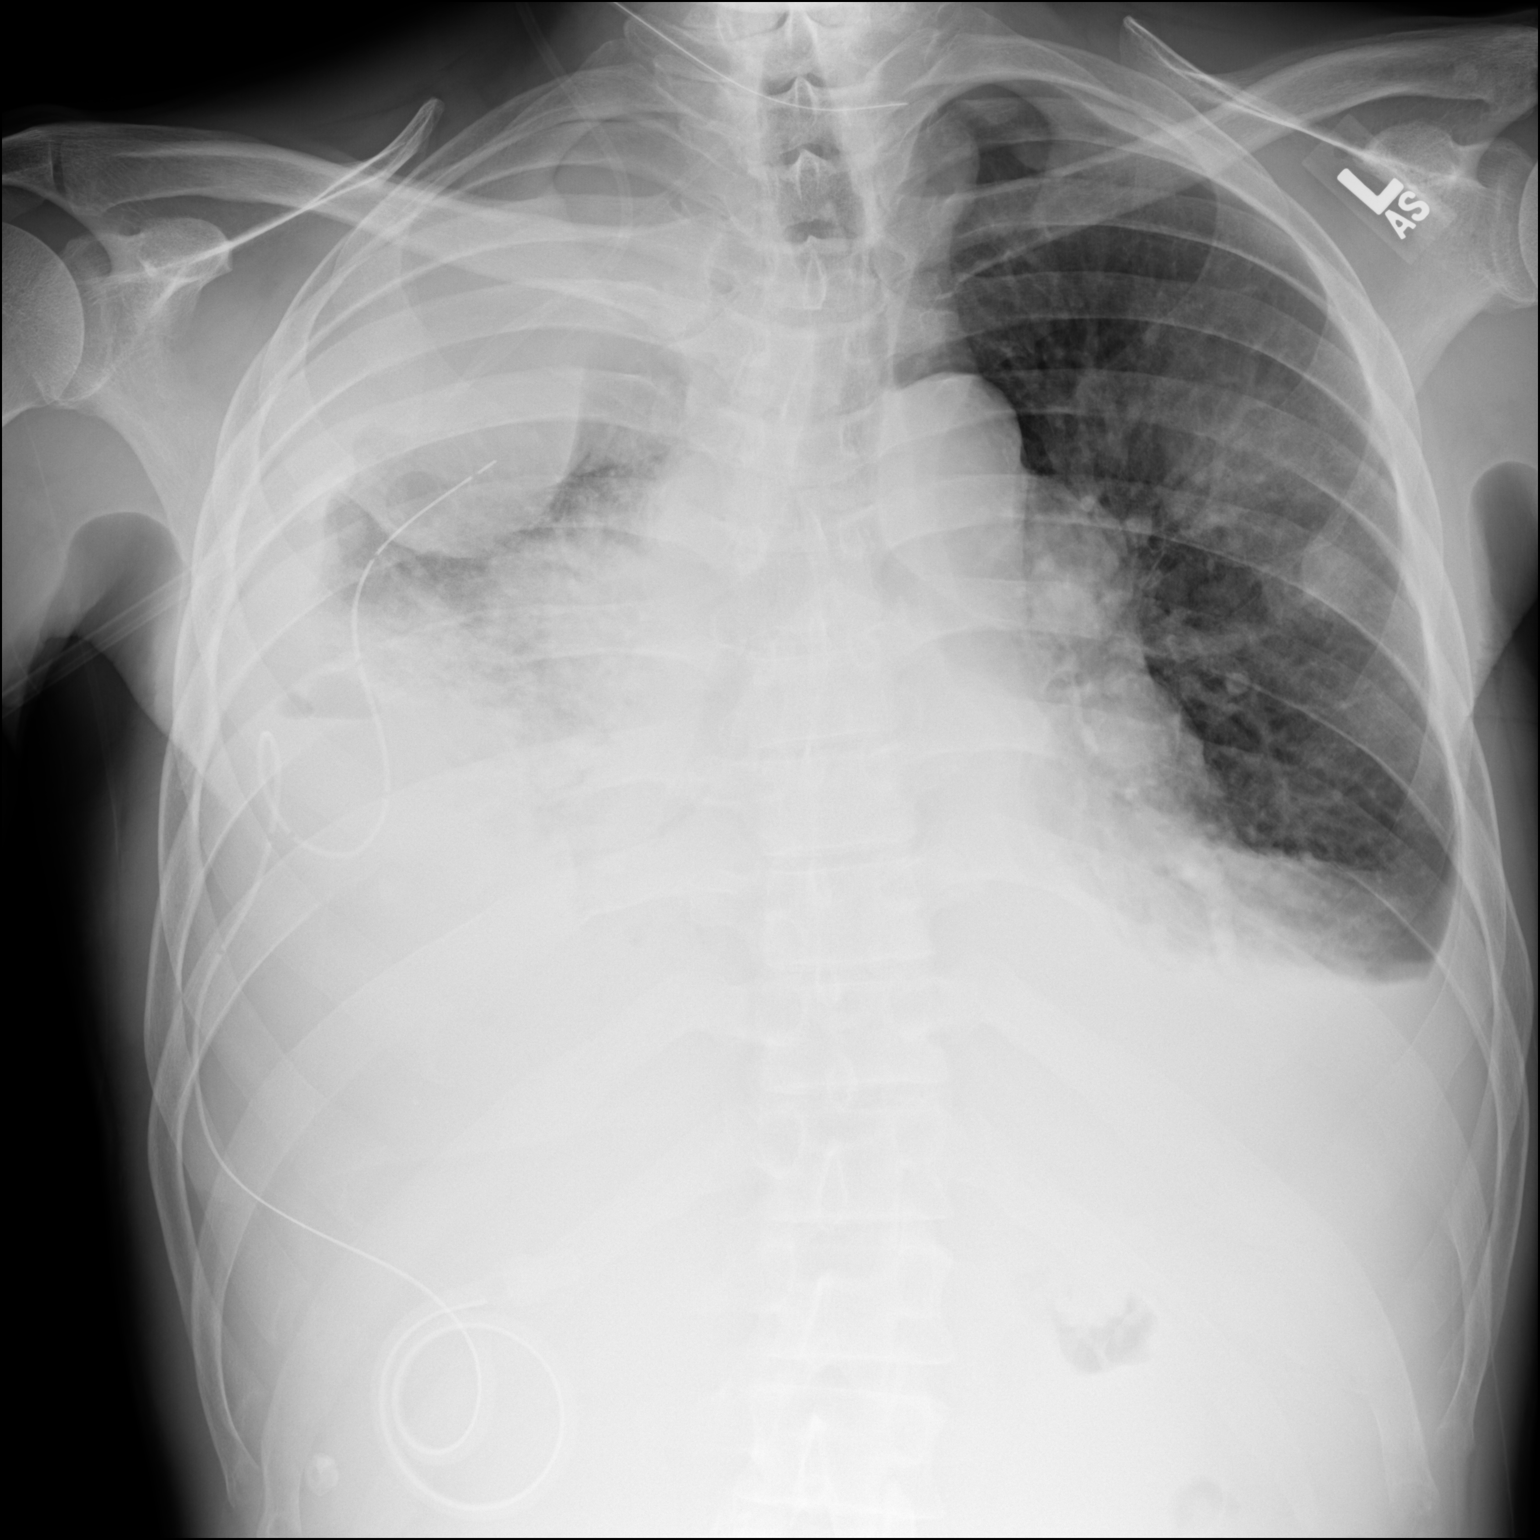

[dg chest 2 view (2 of 2)]
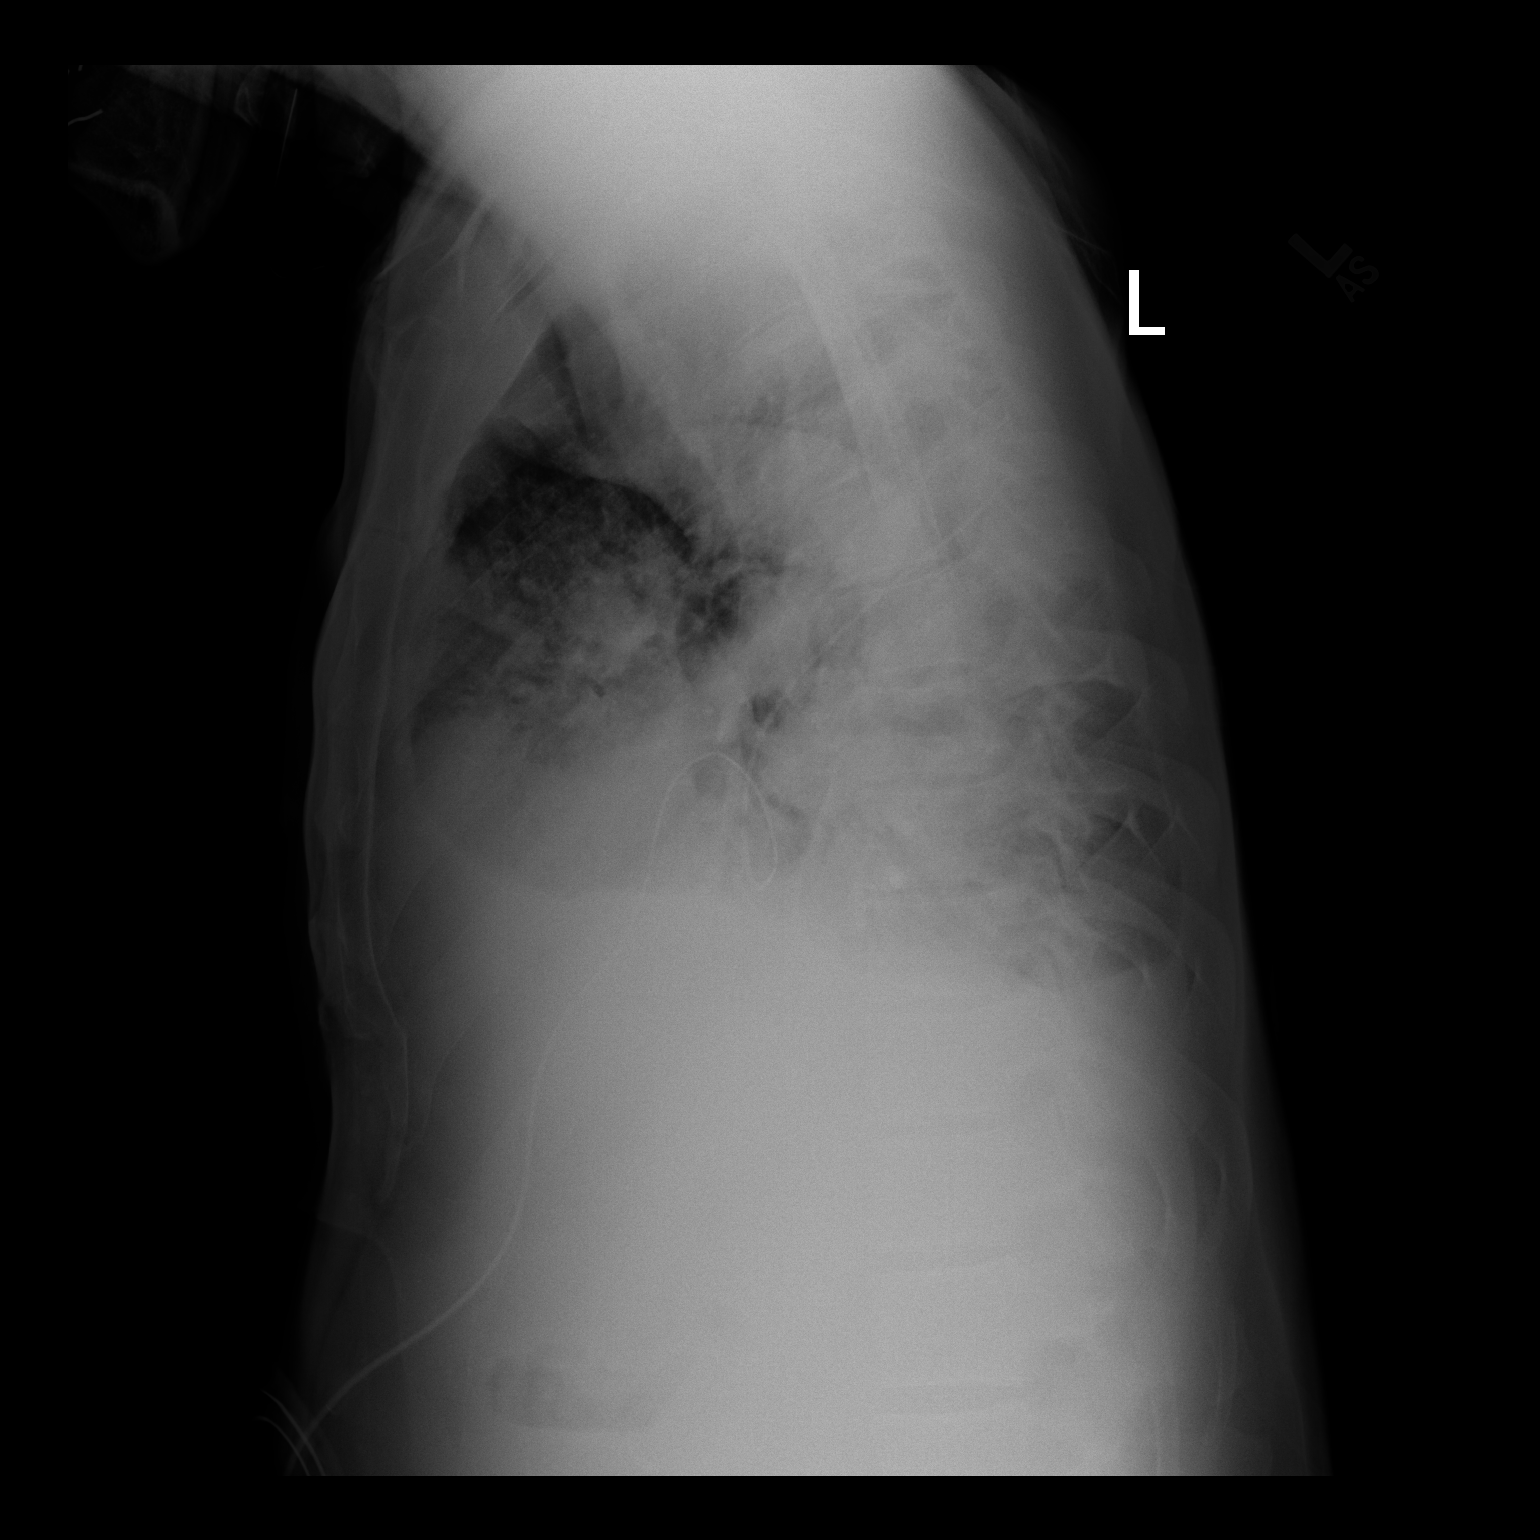

[2 of 2 positions shown; findings below may reference images not displayed]

FINDINGS: PleurX catheter placed on the right. No pneumothorax. Large mass
right upper lobe remains. Suspected loculated pleural effusion with
areas of consolidation throughout the right lung appear similar to
recent study.

On the left, there is a small pleural effusion. There is ill-defined
opacity in the left upper lobe, likely representing pneumonia.
Slightly increased opacity in this area compared to 1 month prior.

Heart is prominent, stable, with pulmonary vascularity normal.
Suspected hilar adenopathy bilaterally. Soft tissue fullness left
paratracheal region, likely due to adenopathy. No bone lesions
IMPRESSION: PleurX catheter placed on the right without pneumothorax. Mass
lesions and areas of suspected adenopathy consistent with known
underlying neoplasm. Opacity throughout much of the right lung
consistent with combination of areas of pleural effusion in airspace
consolidation. Increased opacity left upper lobe, likely pneumonia,
although neoplasm underlying is possible in this area. Small left
pleural effusion. Stable cardiac silhouette.

## 2021-12-30 IMAGING — CT CT CHEST W/O CM
2 of 4 series · 13 of 36 positions shown, 16 images · non-contrast
Comparison: Chest x-ray of March 09, 2021 and prior imaging of the
abdomen and pelvis from Tuesday December, 2020 as well as biopsy images from
Friday January, 2021.

CLINICAL DATA: Abnormal fall chest x-ray with pleural fluid and
masslike areas in the chest.

EXAM:
CT CHEST WITHOUT CONTRAST
TECHNIQUE: Multidetector CT imaging of the chest was performed following the
standard protocol without IV contrast.

[Series 2: routine chest without · axial · non-contrast · 0.80mm/px · z∈[+1281,+1611]mm · 10 of 197 slices shown, 13 images]
[im 16/197  mediastinal]
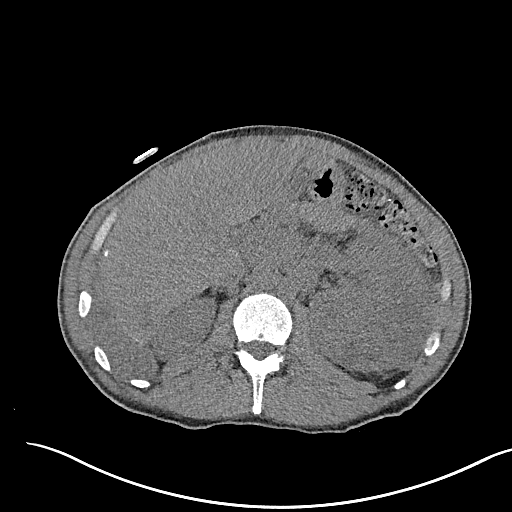
[im 16/197  lung]
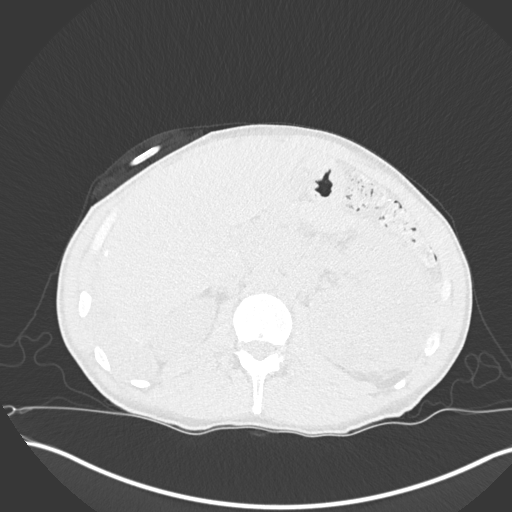
[im 31/197  lung]
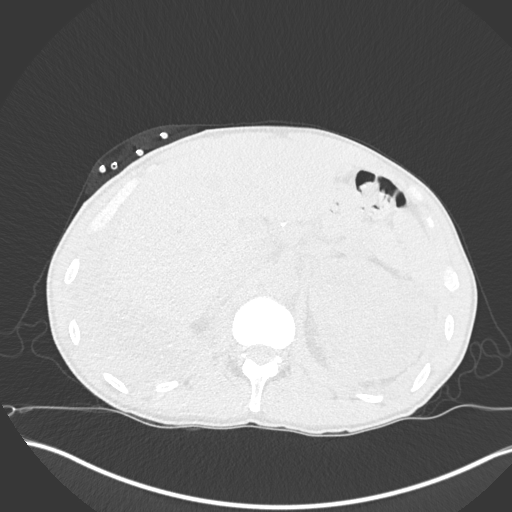
[im 61/197  lung]
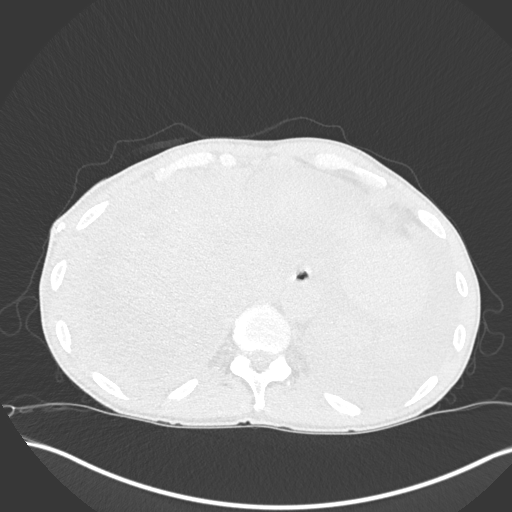
[im 76/197  lung]
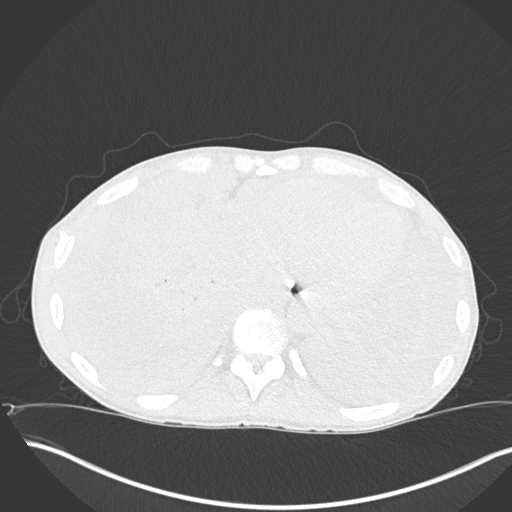
[im 91/197  mediastinal]
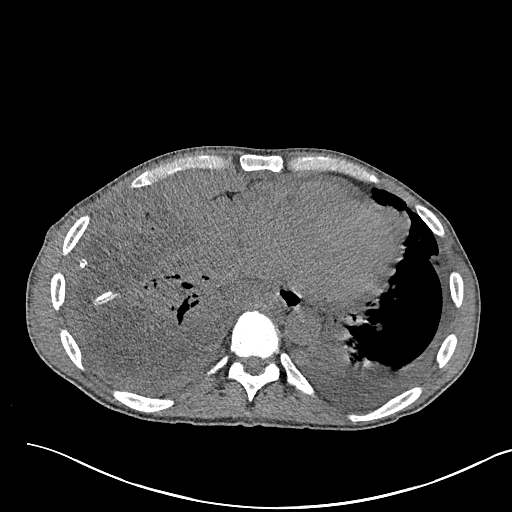
[im 91/197  lung]
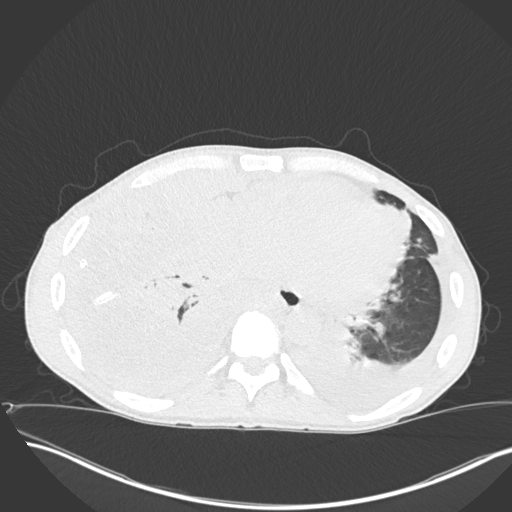
[im 106/197  lung]
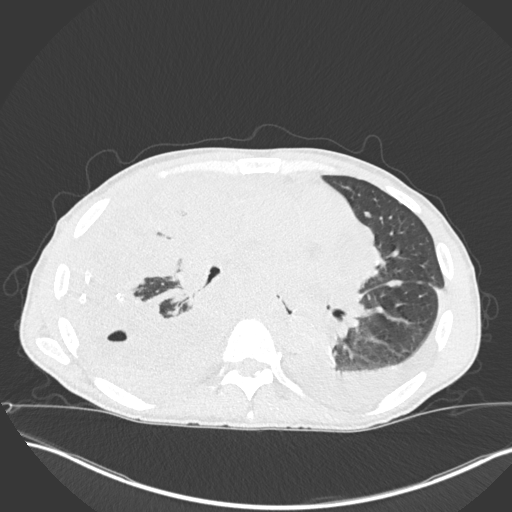
[im 121/197  lung]
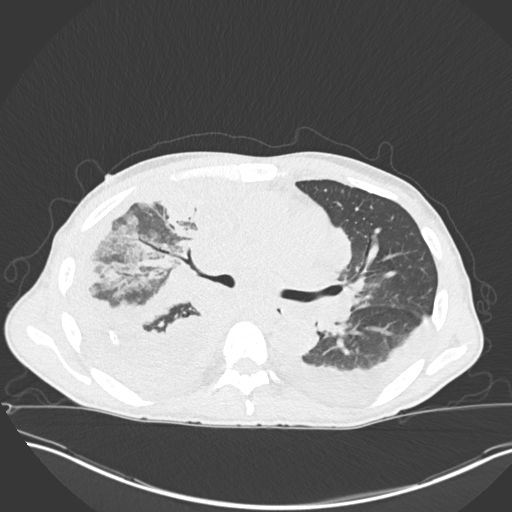
[im 151/197  lung]
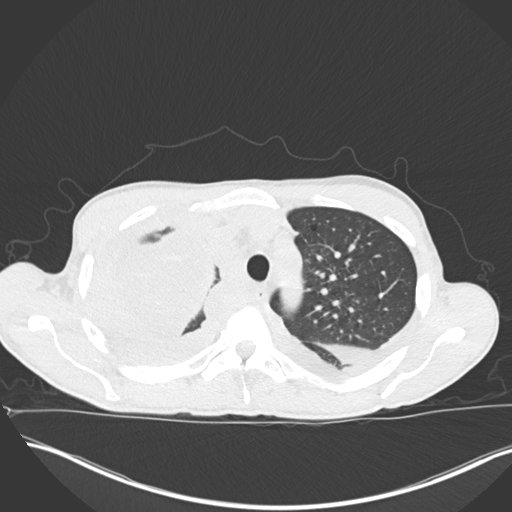
[im 166/197  mediastinal]
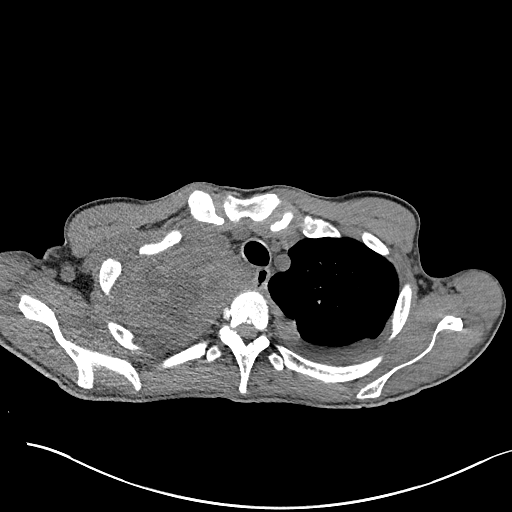
[im 166/197  lung]
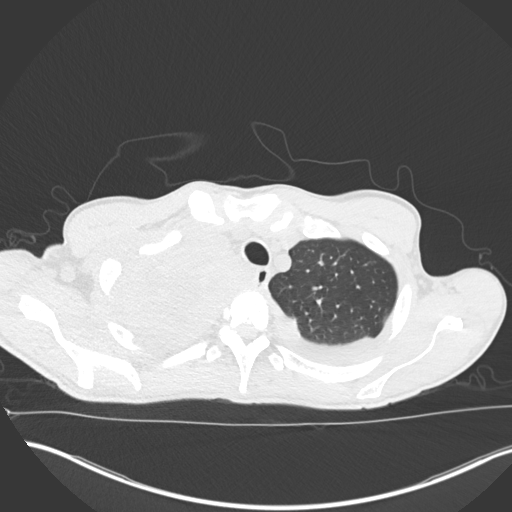
[im 181/197  lung]
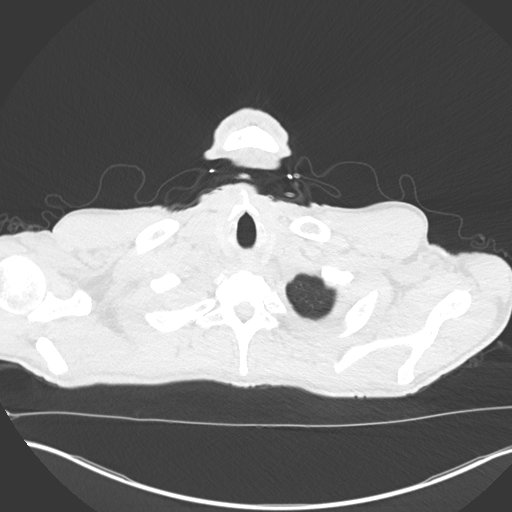

[Series 5: coronal · coronal · 0.72mm/px · 3 of 151 slices shown]
[im 31/151  lung]
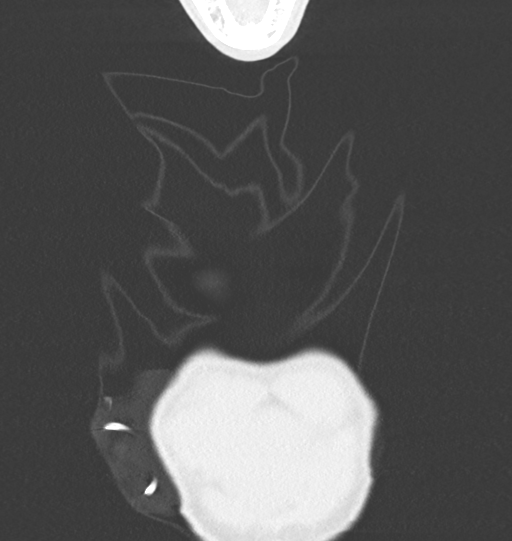
[im 61/151  lung]
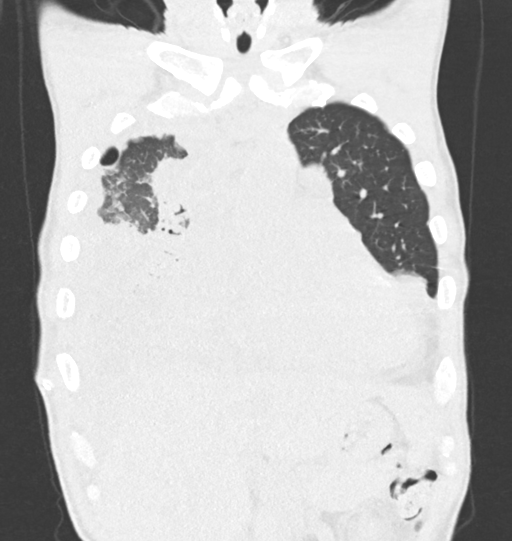
[im 91/151  lung]
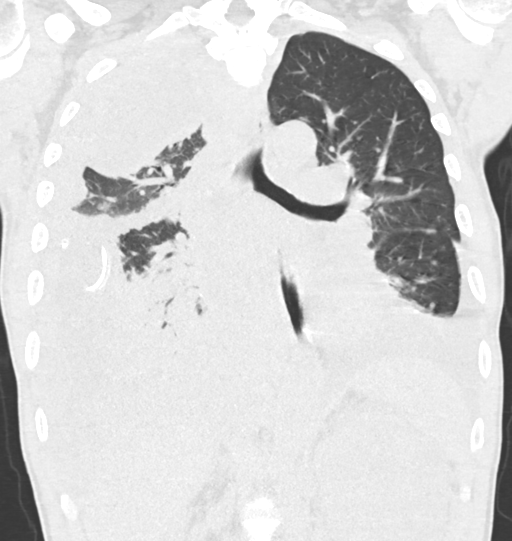

[13 of 36 positions shown; findings below may reference images not displayed]

FINDINGS: Cardiovascular: Shift of heart mediastinal structures from RIGHT to
LEFT, see below. Pericardial effusion with similar volume.
Pericardial nodularity in adjacent adenopathy increased.

Mediastinum/Nodes: Thoracic inlet structures are normal. No axillary
lymphadenopathy.

Bulky subcarinal adenopathy measuring as much as 4.0 x 6.1 cm,
previously 3.2 cm short axis.

AP window adenopathy increased (image 65/2) this could be a mixture
of AP window lymph nodes and adjacent pleural disease but measuring
approximately 2.5 cm greatest thickness as compared to 2 cm on the
previous abdominal CT.

Lungs/Pleura: Heart is shifted into the LEFT chest due to worsening
pleural disease and enlarging effusion in the RIGHT chest. Loculated
appearance of fluid and mixed density in the RIGHT upper chest
corresponds to the chest x-ray abnormality of convex protrusions
along the upper margin of the PleurX catheter. The PleurX catheter
is seen in the posterior RIGHT chest. This is situated more in the
anterior RIGHT chest in fills the RIGHT lung apex. Pleural
thickening in the RIGHT lower chest is markedly increased compared
to previous imaging of just 2 months ago, now circumferential
throughout the RIGHT chest. In the anterior chest in the
cardiophrenic recess there is near confluent disease that tracks
along the pleural space peripherally and involves collapsed lung in
pleural surfaces including the major fissure tracking into the RIGHT
lung apex. This measures in total approximately 8.3 x 8.6 cm
greatest axial dimension previously individual nodules in this area
while not insignificant ranged in size from 1 cm to 2 cm and in
total while not confluent on the prior study measuring approximately
5.4 x 3.4 cm in axial dimension. Shift of mediastinum may be
slightly increased but is present on the prior study as well.

In addition to circumferential soft tissue with increase in the
RIGHT chest there is increasing pleural fluid in the LEFT chest with
signs of pleural involvement (image [DATE]) 1.8 cm pleural base
nodule.

(Image 61/2 1.8 x 1.1 cm pleural base nodule. Similar size nodule in
the anterolateral LEFT upper chest.

Diminished aeration of lung in the RIGHT chest compared to previous
imaging. Septal thickening and ground-glass in the RIGHT middle
lobe. Minimal aeration of the RIGHT lower lobe. Volume loss also in
the RIGHT upper lobe due to worsening disease

Pleural nodularity the extends along the fissure in the LEFT chest
as well. Major airways are patent with narrowing of peripheral
airways in the RIGHT chest.

Upper Abdomen: Large LEFT renal mass not well evaluated better seen
on recent CT. Bulky para-aortic adenopathy and renal venous
extension also better evaluated on recent CT. Expansion of the LEFT
renal vein may be increased is a crosses the midline, difficult to
assess.

Increasing size of hepatic metastatic lesions and likely with new
hepatic metastatic disease. On image 144 of series 2 measuring 5 x
2.3 cm this previously measured approximately 2.9 x 2.0 cm.

New lesions are suspected with numerous low-attenuation foci now
seen throughout the liver. For instance on image 178 of series 2 is
a 16 mm lesion not seen on the previous study.

Musculoskeletal: No acute musculoskeletal process. No destructive
bone finding
IMPRESSION: 1. Marked interval worsening of pleural disease particularly in the
RIGHT chest, likely associated with small amounts of hemorrhage
scattered throughout a loculated area in the superior aspect of the
RIGHT chest accounting for the convex abnormality seen on chest
x-ray above the PleurX catheter.
2. PleurX catheter terminates in the upper to mid chest below this
area likely tracking along the major fissure though difficult to
determine given the extent of pleural disease.
3. Associated volume loss in the RIGHT chest as described. Area of
either lymphangitic tumor spread in the RIGHT middle lobe versus
postobstructive pneumonitis.
4. Signs of new and worsening hepatic metastatic disease.
5. Similar volume of pericardial effusion.

These results will be called to the ordering clinician or
representative by the Radiologist Assistant, and communication
documented in the PACS or [REDACTED].
